# Patient Record
Sex: Male | Born: 1950
Health system: Southern US, Community
[De-identification: ages and names within clinical notes are randomized; demographics above are authoritative.]

## PROBLEM LIST (undated history)

## (undated) DIAGNOSIS — K4020 Bilateral inguinal hernia, without obstruction or gangrene, not specified as recurrent: Secondary | ICD-10-CM

## (undated) DIAGNOSIS — R0600 Dyspnea, unspecified: Secondary | ICD-10-CM

## (undated) DIAGNOSIS — L719 Rosacea, unspecified: Secondary | ICD-10-CM

## (undated) DIAGNOSIS — K760 Fatty (change of) liver, not elsewhere classified: Secondary | ICD-10-CM

## (undated) DIAGNOSIS — K8020 Calculus of gallbladder without cholecystitis without obstruction: Secondary | ICD-10-CM

## (undated) DIAGNOSIS — N433 Hydrocele, unspecified: Secondary | ICD-10-CM

## (undated) DIAGNOSIS — N200 Calculus of kidney: Secondary | ICD-10-CM

## (undated) DIAGNOSIS — E785 Hyperlipidemia, unspecified: Secondary | ICD-10-CM

## (undated) DIAGNOSIS — H409 Unspecified glaucoma: Secondary | ICD-10-CM

## (undated) DIAGNOSIS — N529 Male erectile dysfunction, unspecified: Secondary | ICD-10-CM

## (undated) DIAGNOSIS — J189 Pneumonia, unspecified organism: Secondary | ICD-10-CM

## (undated) DIAGNOSIS — I4891 Unspecified atrial fibrillation: Secondary | ICD-10-CM

## (undated) DIAGNOSIS — C44311 Basal cell carcinoma of skin of nose: Secondary | ICD-10-CM

## (undated) DIAGNOSIS — J449 Chronic obstructive pulmonary disease, unspecified: Secondary | ICD-10-CM

## (undated) DIAGNOSIS — Z8701 Personal history of pneumonia (recurrent): Principal | ICD-10-CM

## (undated) DIAGNOSIS — H269 Unspecified cataract: Secondary | ICD-10-CM

## (undated) DIAGNOSIS — N35919 Unspecified urethral stricture, male, unspecified site: Secondary | ICD-10-CM

## (undated) DIAGNOSIS — I5189 Other ill-defined heart diseases: Secondary | ICD-10-CM

## (undated) DIAGNOSIS — K219 Gastro-esophageal reflux disease without esophagitis: Secondary | ICD-10-CM

## (undated) DIAGNOSIS — I2510 Atherosclerotic heart disease of native coronary artery without angina pectoris: Secondary | ICD-10-CM

## (undated) DIAGNOSIS — Z8619 Personal history of other infectious and parasitic diseases: Secondary | ICD-10-CM

## (undated) DIAGNOSIS — Z87442 Personal history of urinary calculi: Secondary | ICD-10-CM

## (undated) DIAGNOSIS — Z972 Presence of dental prosthetic device (complete) (partial): Secondary | ICD-10-CM

## (undated) DIAGNOSIS — Z87891 Personal history of nicotine dependence: Secondary | ICD-10-CM

## (undated) DIAGNOSIS — I700 Atherosclerosis of aorta: Secondary | ICD-10-CM

## (undated) DIAGNOSIS — I10 Essential (primary) hypertension: Secondary | ICD-10-CM

## (undated) DIAGNOSIS — K429 Umbilical hernia without obstruction or gangrene: Secondary | ICD-10-CM

## (undated) DIAGNOSIS — M1990 Unspecified osteoarthritis, unspecified site: Secondary | ICD-10-CM

## (undated) DIAGNOSIS — L219 Seborrheic dermatitis, unspecified: Secondary | ICD-10-CM

## (undated) DIAGNOSIS — I499 Cardiac arrhythmia, unspecified: Secondary | ICD-10-CM

## (undated) HISTORY — DX: Gastro-esophageal reflux disease without esophagitis: K21.9

## (undated) HISTORY — DX: Atherosclerotic heart disease of native coronary artery without angina pectoris: I25.10

## (undated) HISTORY — DX: Rosacea, unspecified: L71.9

## (undated) HISTORY — DX: Unspecified atrial fibrillation: I48.91

## (undated) HISTORY — DX: Calculus of gallbladder without cholecystitis without obstruction: K80.20

## (undated) HISTORY — DX: Unspecified glaucoma: H40.9

## (undated) HISTORY — DX: Calculus of kidney: N20.0

## (undated) HISTORY — DX: Personal history of pneumonia (recurrent): Z87.01

## (undated) HISTORY — DX: Personal history of other infectious and parasitic diseases: Z86.19

## (undated) HISTORY — DX: Basal cell carcinoma of skin of nose: C44.311

## (undated) HISTORY — DX: Unspecified osteoarthritis, unspecified site: M19.90

## (undated) HISTORY — DX: Male erectile dysfunction, unspecified: N52.9

## (undated) HISTORY — DX: Chronic obstructive pulmonary disease, unspecified: J44.9

## (undated) HISTORY — DX: Fatty (change of) liver, not elsewhere classified: K76.0

## (undated) HISTORY — DX: Atherosclerosis of aorta: I70.0

## (undated) HISTORY — PX: COLONOSCOPY: SHX174

## (undated) HISTORY — DX: Seborrheic dermatitis, unspecified: L21.9

## (undated) HISTORY — DX: Hyperlipidemia, unspecified: E78.5

## (undated) HISTORY — DX: Personal history of nicotine dependence: Z87.891

---

## 1996-02-04 DIAGNOSIS — C44311 Basal cell carcinoma of skin of nose: Secondary | ICD-10-CM

## 1996-02-04 HISTORY — DX: Basal cell carcinoma of skin of nose: C44.311

## 2001-12-24 ENCOUNTER — Encounter: Admission: RE | Admit: 2001-12-24 | Discharge: 2001-12-24 | Payer: Self-pay | Admitting: Family Medicine

## 2001-12-24 ENCOUNTER — Encounter: Payer: Self-pay | Admitting: Family Medicine

## 2002-01-04 ENCOUNTER — Encounter: Payer: Self-pay | Admitting: Family Medicine

## 2002-01-04 ENCOUNTER — Encounter: Admission: RE | Admit: 2002-01-04 | Discharge: 2002-01-04 | Payer: Self-pay | Admitting: Family Medicine

## 2012-05-04 ENCOUNTER — Emergency Department: Payer: Self-pay | Admitting: Emergency Medicine

## 2013-04-28 LAB — COMPREHENSIVE METABOLIC PANEL
ALK PHOS: 55 U/L
ALT: 32
AST: 31 U/L
Creat: 1.22
Glucose: 86
LDH: 192 U/L
Potassium: 4.7 mmol/L
Sodium: 145 mmol/L (ref 137–147)
Total Bilirubin: 0.5 mg/dL
Uric Acid: 7.8

## 2013-04-28 LAB — LIPID PANEL
CHOLESTEROL: 213 mg/dL — AB (ref 0–200)
HDL: 48 mg/dL (ref 35–70)
Triglycerides: 75

## 2013-06-03 DIAGNOSIS — Z8701 Personal history of pneumonia (recurrent): Secondary | ICD-10-CM

## 2013-06-03 DIAGNOSIS — I4891 Unspecified atrial fibrillation: Secondary | ICD-10-CM | POA: Insufficient documentation

## 2013-06-03 HISTORY — DX: Unspecified atrial fibrillation: I48.91

## 2013-06-03 HISTORY — DX: Personal history of pneumonia (recurrent): Z87.01

## 2013-06-04 ENCOUNTER — Inpatient Hospital Stay: Payer: Self-pay | Admitting: Internal Medicine

## 2013-06-04 DIAGNOSIS — R946 Abnormal results of thyroid function studies: Secondary | ICD-10-CM | POA: Diagnosis not present

## 2013-06-04 DIAGNOSIS — L408 Other psoriasis: Secondary | ICD-10-CM | POA: Diagnosis present

## 2013-06-04 DIAGNOSIS — E0781 Sick-euthyroid syndrome: Secondary | ICD-10-CM | POA: Diagnosis present

## 2013-06-04 DIAGNOSIS — R05 Cough: Secondary | ICD-10-CM | POA: Diagnosis not present

## 2013-06-04 DIAGNOSIS — R071 Chest pain on breathing: Secondary | ICD-10-CM | POA: Diagnosis not present

## 2013-06-04 DIAGNOSIS — A403 Sepsis due to Streptococcus pneumoniae: Secondary | ICD-10-CM | POA: Diagnosis present

## 2013-06-04 DIAGNOSIS — J13 Pneumonia due to Streptococcus pneumoniae: Secondary | ICD-10-CM | POA: Diagnosis present

## 2013-06-04 DIAGNOSIS — E119 Type 2 diabetes mellitus without complications: Secondary | ICD-10-CM | POA: Diagnosis present

## 2013-06-04 DIAGNOSIS — J441 Chronic obstructive pulmonary disease with (acute) exacerbation: Secondary | ICD-10-CM | POA: Diagnosis present

## 2013-06-04 DIAGNOSIS — A419 Sepsis, unspecified organism: Secondary | ICD-10-CM | POA: Diagnosis present

## 2013-06-04 DIAGNOSIS — N179 Acute kidney failure, unspecified: Secondary | ICD-10-CM | POA: Diagnosis not present

## 2013-06-04 DIAGNOSIS — I959 Hypotension, unspecified: Secondary | ICD-10-CM | POA: Diagnosis not present

## 2013-06-04 DIAGNOSIS — H409 Unspecified glaucoma: Secondary | ICD-10-CM | POA: Diagnosis present

## 2013-06-04 DIAGNOSIS — R0602 Shortness of breath: Secondary | ICD-10-CM | POA: Diagnosis not present

## 2013-06-04 DIAGNOSIS — R059 Cough, unspecified: Secondary | ICD-10-CM | POA: Diagnosis not present

## 2013-06-04 DIAGNOSIS — R079 Chest pain, unspecified: Secondary | ICD-10-CM | POA: Diagnosis not present

## 2013-06-04 DIAGNOSIS — E860 Dehydration: Secondary | ICD-10-CM | POA: Diagnosis present

## 2013-06-04 DIAGNOSIS — Z794 Long term (current) use of insulin: Secondary | ICD-10-CM | POA: Diagnosis not present

## 2013-06-04 DIAGNOSIS — J189 Pneumonia, unspecified organism: Secondary | ICD-10-CM | POA: Diagnosis not present

## 2013-06-04 DIAGNOSIS — Z7982 Long term (current) use of aspirin: Secondary | ICD-10-CM | POA: Diagnosis not present

## 2013-06-04 DIAGNOSIS — I509 Heart failure, unspecified: Secondary | ICD-10-CM | POA: Diagnosis not present

## 2013-06-04 DIAGNOSIS — F17200 Nicotine dependence, unspecified, uncomplicated: Secondary | ICD-10-CM | POA: Diagnosis not present

## 2013-06-04 DIAGNOSIS — I4891 Unspecified atrial fibrillation: Secondary | ICD-10-CM | POA: Diagnosis not present

## 2013-06-04 LAB — CBC
HCT: 39.4 % — ABNORMAL LOW (ref 40.0–52.0)
HGB: 13.1 g/dL (ref 13.0–18.0)
MCH: 28.6 pg (ref 26.0–34.0)
MCHC: 33.2 g/dL (ref 32.0–36.0)
MCV: 86 fL (ref 80–100)
PLATELETS: 253 10*3/uL (ref 150–440)
RBC: 4.57 10*6/uL (ref 4.40–5.90)
RDW: 14.4 % (ref 11.5–14.5)
WBC: 39 10*3/uL — ABNORMAL HIGH (ref 3.8–10.6)

## 2013-06-04 LAB — COMPREHENSIVE METABOLIC PANEL
ALK PHOS: 53 U/L
AST: 15 U/L (ref 15–37)
Albumin: 3.2 g/dL — ABNORMAL LOW (ref 3.4–5.0)
Anion Gap: 11 (ref 7–16)
BILIRUBIN TOTAL: 0.8 mg/dL (ref 0.2–1.0)
BUN: 21 mg/dL — AB (ref 7–18)
CO2: 25 mmol/L (ref 21–32)
Calcium, Total: 8.9 mg/dL (ref 8.5–10.1)
Chloride: 100 mmol/L (ref 98–107)
Creatinine: 1.72 mg/dL — ABNORMAL HIGH (ref 0.60–1.30)
EGFR (African American): 48 — ABNORMAL LOW
GFR CALC NON AF AMER: 41 — AB
Glucose: 135 mg/dL — ABNORMAL HIGH (ref 65–99)
Osmolality: 277 (ref 275–301)
POTASSIUM: 3.7 mmol/L (ref 3.5–5.1)
SGPT (ALT): 26 U/L (ref 12–78)
Sodium: 136 mmol/L (ref 136–145)
TOTAL PROTEIN: 6.9 g/dL (ref 6.4–8.2)

## 2013-06-04 LAB — DIFFERENTIAL
BASOS ABS: 1 %
Bands: 14 %
COMMENT - H1-COM2: NORMAL
LYMPHS PCT: 7 %
Monocytes: 5 %
SEGMENTED NEUTROPHILS: 73 %

## 2013-06-04 LAB — TROPONIN I
Troponin-I: <0.02 ng/mL
Troponin-I: <0.02 ng/mL

## 2013-06-04 LAB — D-DIMER(ARMC): D-Dimer: 579 ng/ml

## 2013-06-05 DIAGNOSIS — F17200 Nicotine dependence, unspecified, uncomplicated: Secondary | ICD-10-CM | POA: Diagnosis not present

## 2013-06-05 DIAGNOSIS — N179 Acute kidney failure, unspecified: Secondary | ICD-10-CM | POA: Diagnosis not present

## 2013-06-05 DIAGNOSIS — I509 Heart failure, unspecified: Secondary | ICD-10-CM | POA: Diagnosis not present

## 2013-06-05 DIAGNOSIS — A419 Sepsis, unspecified organism: Secondary | ICD-10-CM | POA: Diagnosis not present

## 2013-06-05 DIAGNOSIS — I4891 Unspecified atrial fibrillation: Secondary | ICD-10-CM | POA: Diagnosis not present

## 2013-06-05 DIAGNOSIS — J189 Pneumonia, unspecified organism: Secondary | ICD-10-CM | POA: Diagnosis not present

## 2013-06-05 LAB — TSH: THYROID STIMULATING HORM: 0.231 u[IU]/mL — AB

## 2013-06-05 LAB — HEMOGLOBIN A1C: Hemoglobin A1C: 6.4 % — ABNORMAL HIGH (ref 4.2–6.3)

## 2013-06-06 DIAGNOSIS — N179 Acute kidney failure, unspecified: Secondary | ICD-10-CM | POA: Diagnosis not present

## 2013-06-06 DIAGNOSIS — A419 Sepsis, unspecified organism: Secondary | ICD-10-CM | POA: Diagnosis not present

## 2013-06-06 DIAGNOSIS — I509 Heart failure, unspecified: Secondary | ICD-10-CM | POA: Diagnosis not present

## 2013-06-06 DIAGNOSIS — F17200 Nicotine dependence, unspecified, uncomplicated: Secondary | ICD-10-CM | POA: Diagnosis not present

## 2013-06-06 DIAGNOSIS — J189 Pneumonia, unspecified organism: Secondary | ICD-10-CM | POA: Diagnosis not present

## 2013-06-06 DIAGNOSIS — I4891 Unspecified atrial fibrillation: Secondary | ICD-10-CM | POA: Diagnosis not present

## 2013-06-06 LAB — CREATININE, SERUM
Creatinine: 1.19 mg/dL (ref 0.60–1.30)
EGFR (African American): >60
EGFR (Non-African Amer.): >60

## 2013-06-06 LAB — T4, FREE: Free Thyroxine: 0.9 ng/dL (ref 0.76–1.46)

## 2013-06-06 LAB — WBC: WBC: 27.6 10*3/uL — ABNORMAL HIGH (ref 3.8–10.6)

## 2013-06-07 DIAGNOSIS — F17200 Nicotine dependence, unspecified, uncomplicated: Secondary | ICD-10-CM | POA: Diagnosis not present

## 2013-06-07 DIAGNOSIS — J189 Pneumonia, unspecified organism: Secondary | ICD-10-CM | POA: Diagnosis not present

## 2013-06-07 DIAGNOSIS — I4891 Unspecified atrial fibrillation: Secondary | ICD-10-CM | POA: Diagnosis not present

## 2013-06-07 DIAGNOSIS — I509 Heart failure, unspecified: Secondary | ICD-10-CM | POA: Diagnosis not present

## 2013-06-07 DIAGNOSIS — A419 Sepsis, unspecified organism: Secondary | ICD-10-CM | POA: Diagnosis not present

## 2013-06-07 DIAGNOSIS — N179 Acute kidney failure, unspecified: Secondary | ICD-10-CM | POA: Diagnosis not present

## 2013-06-07 LAB — CBC WITH DIFFERENTIAL/PLATELET
BASOS ABS: 0.1 10*3/uL (ref 0.0–0.1)
Basophil %: 0.4 %
EOS ABS: 0 10*3/uL (ref 0.0–0.7)
EOS PCT: 0.1 %
HCT: 38.8 % — ABNORMAL LOW (ref 40.0–52.0)
HGB: 12.6 g/dL — AB (ref 13.0–18.0)
LYMPHS ABS: 3.8 10*3/uL — AB (ref 1.0–3.6)
LYMPHS PCT: 28.1 %
MCH: 28.3 pg (ref 26.0–34.0)
MCHC: 32.4 g/dL (ref 32.0–36.0)
MCV: 87 fL (ref 80–100)
Monocyte #: 0.7 x10 3/mm (ref 0.2–1.0)
Monocyte %: 5.5 %
Neutrophil #: 9 10*3/uL — ABNORMAL HIGH (ref 1.4–6.5)
Neutrophil %: 65.9 %
Platelet: 249 10*3/uL (ref 150–440)
RBC: 4.45 10*6/uL (ref 4.40–5.90)
RDW: 14.5 % (ref 11.5–14.5)
WBC: 13.7 10*3/uL — ABNORMAL HIGH (ref 3.8–10.6)

## 2013-06-07 LAB — COMPREHENSIVE METABOLIC PANEL
ALK PHOS: 53 U/L
ALT: 26 U/L (ref 10–40)
AST: 15 U/L
CREATININE: 1.72
GLUCOSE: 135
Potassium: 3.7 mmol/L
Sodium: 136 mmol/L — AB (ref 137–147)
Total Bilirubin: 0.8 mg/dL

## 2013-06-07 LAB — CBC
HEMOGLOBIN: 13.1 g/dL
PLATELET COUNT: 253
WBC: 39

## 2013-06-07 LAB — T4, FREE: Free T4: 0.9

## 2013-06-07 LAB — EXPECTORATED SPUTUM ASSESSMENT W GRAM STAIN, RFLX TO RESP C

## 2013-06-07 LAB — TSH: TSH: 0.231

## 2013-06-07 LAB — HEMOGLOBIN A1C: A1C: 6.4

## 2013-06-09 DIAGNOSIS — I4891 Unspecified atrial fibrillation: Secondary | ICD-10-CM | POA: Diagnosis not present

## 2013-06-09 DIAGNOSIS — I10 Essential (primary) hypertension: Secondary | ICD-10-CM | POA: Diagnosis not present

## 2013-06-09 DIAGNOSIS — R079 Chest pain, unspecified: Secondary | ICD-10-CM | POA: Diagnosis not present

## 2013-06-09 LAB — CULTURE, BLOOD (SINGLE)

## 2013-06-14 DIAGNOSIS — R072 Precordial pain: Secondary | ICD-10-CM | POA: Diagnosis not present

## 2013-06-16 DIAGNOSIS — I10 Essential (primary) hypertension: Secondary | ICD-10-CM | POA: Diagnosis not present

## 2013-06-16 DIAGNOSIS — I4891 Unspecified atrial fibrillation: Secondary | ICD-10-CM | POA: Diagnosis not present

## 2013-06-16 DIAGNOSIS — F17200 Nicotine dependence, unspecified, uncomplicated: Secondary | ICD-10-CM | POA: Diagnosis not present

## 2013-07-04 DIAGNOSIS — R946 Abnormal results of thyroid function studies: Secondary | ICD-10-CM | POA: Diagnosis not present

## 2013-07-11 ENCOUNTER — Encounter: Payer: Self-pay | Admitting: Family Medicine

## 2013-07-11 ENCOUNTER — Ambulatory Visit (INDEPENDENT_AMBULATORY_CARE_PROVIDER_SITE_OTHER): Payer: Medicare Other | Admitting: Family Medicine

## 2013-07-11 ENCOUNTER — Encounter (INDEPENDENT_AMBULATORY_CARE_PROVIDER_SITE_OTHER): Payer: Self-pay

## 2013-07-11 VITALS — BP 122/68 | HR 62 | Temp 98.1°F | Ht 65.5 in | Wt 156.0 lb

## 2013-07-11 DIAGNOSIS — E785 Hyperlipidemia, unspecified: Secondary | ICD-10-CM

## 2013-07-11 DIAGNOSIS — K219 Gastro-esophageal reflux disease without esophagitis: Secondary | ICD-10-CM

## 2013-07-11 DIAGNOSIS — Z8701 Personal history of pneumonia (recurrent): Secondary | ICD-10-CM

## 2013-07-11 DIAGNOSIS — I4891 Unspecified atrial fibrillation: Secondary | ICD-10-CM | POA: Diagnosis not present

## 2013-07-11 DIAGNOSIS — Z23 Encounter for immunization: Secondary | ICD-10-CM | POA: Diagnosis not present

## 2013-07-11 DIAGNOSIS — J449 Chronic obstructive pulmonary disease, unspecified: Secondary | ICD-10-CM | POA: Insufficient documentation

## 2013-07-11 DIAGNOSIS — Z87891 Personal history of nicotine dependence: Secondary | ICD-10-CM | POA: Insufficient documentation

## 2013-07-11 DIAGNOSIS — H409 Unspecified glaucoma: Secondary | ICD-10-CM | POA: Diagnosis not present

## 2013-07-11 NOTE — Progress Notes (Signed)
Pre visit review using our clinic review tool, if applicable. No additional management support is needed unless otherwise documented below in the visit note. 

## 2013-07-11 NOTE — Assessment & Plan Note (Signed)
Controlled on daily PPI

## 2013-07-11 NOTE — Assessment & Plan Note (Signed)
Followed regularly by eye doctor 

## 2013-07-11 NOTE — Assessment & Plan Note (Signed)
Broached discussion on lung cancer screening with LDCT. Pt defers discussion for now.

## 2013-07-11 NOTE — Assessment & Plan Note (Signed)
Compliant with flonase 9mcg 2 pufs bid.  Also using combivent inhaler scheduled QID. Advised to decrease this to PRN dosing as tolerated. Pt agrees.

## 2013-07-11 NOTE — Assessment & Plan Note (Signed)
Anticipate sole episode of afib brought on during stress of recent hospitalization with PNA. Currently sounds regular. Recommended continue aspirin daily and sotalol bid. Did not start further anticoagulation today, but reviewed with patient reasons to return for further eval ie recurrent irregular heart rhythm or other concerns. Recheck next visit.

## 2013-07-11 NOTE — Assessment & Plan Note (Signed)
Has resolved well. Provided with prevnar today.

## 2013-07-11 NOTE — Assessment & Plan Note (Signed)
Off meds. Check FLP next fasting blood work.

## 2013-07-11 NOTE — Patient Instructions (Addendum)
prevnar today. Try to decrease combivent - try to use only as needed. Let me know when you're running low on sotalol. Continue other medicines as up to now. Nice to meet you today, call us with questions. Return at your convenience fasting and afterwards for a Welcome to Medicare visit.

## 2013-07-11 NOTE — Progress Notes (Signed)
BP 122/68  Pulse 62  Temp(Src) 98.1 F (36.7 C) (Oral)  Ht 5' 5.5" (1.664 m)  Wt 156 lb (70.761 kg)  BMI 25.56 kg/m2  SpO2 95%   CC: new pt to establish care  Subjective:    Patient ID: Victor Cortez, male    DOB: 1950/12/10, 63 y.o.   MRN: [id number]  HPI: Victor Cortez is a 63 y.o. male presenting on 07/11/2013 for Establish Care   Victor Cortez is an acquaintance of Dr. Honor Junes.  Prior saw Dr. Burt Ek. Brings packet of records from city of [place] - I will review and return to patient.  Longtime smoker - 100+ PY hx. Declines discussion for lung cancer screening at this time.  Recent PNA with sepsis s/p hospitalization at [facility] for 3.5 days. Treated with keflex and zpack and has finished this course. Denies persistent productive cough. Stays dyspneic chronically.  White count was markedly elevated at 39, Cr was elevated at 1.7 prior to discharge.  GERD - controlled on protonix. Atrial fibrillation - rate controlled on sotalol.  This was started after recent hospitalization  Preventative: 05/2013 with Dr. Burt Ek Colonoscopy - >10 yrs ago with possible polyp (Dr. Delfin Edis) Flu shot - 11/2012 Prevnar - today. 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  Allergies and medications reviewed and updated. No current outpatient prescriptions on file prior to visit.   No current facility-administered medications on file prior to visit.    Review of Systems Per HPI unless specifically indicated above    Objective:    BP 122/68  Pulse 62  Temp(Src) 98.1 F (36.7 C) (Oral)  Ht 5' 5.5" (1.664 m)  Wt 156 lb (70.761 kg)  BMI 25.56 kg/m2  SpO2 95%  Physical Exam  Nursing  note and vitals reviewed. Constitutional: He is oriented to person, place, and time. He appears well-developed and well-nourished. No distress.  HENT:  Head: Normocephalic and atraumatic.  Mouth/Throat: Uvula is midline, oropharynx is clear and moist and mucous membranes are normal. No oropharyngeal exudate, posterior oropharyngeal edema or posterior oropharyngeal erythema.  Eyes: Conjunctivae and EOM are normal. Pupils are equal, round, and reactive to light. No scleral icterus.  Neck: Normal range of motion. Neck supple. Carotid bruit is not present. No thyromegaly present.  Cardiovascular: Normal rate, regular rhythm, normal heart sounds and intact distal pulses.   No murmur heard. Pulses:      Radial pulses are 2+ on the right side, and 2+ on the left side.  Sounds regular today  Pulmonary/Chest: Effort normal and breath sounds normal. No respiratory distress. He has no wheezes. He has no rales.  coarse L basilarly  Musculoskeletal: Normal range of motion. He exhibits no edema.  Lymphadenopathy:    He has no cervical adenopathy.  Neurological: He is alert and oriented to person, place, and time.  CN grossly intact, station and gait intact  Skin: Skin is warm and dry. No rash noted.  Psychiatric: He has a normal mood and affect. His behavior is normal. Judgment and thought content normal.   No results found for this or any previous visit.    Assessment & Plan:   Problem List Items Addressed This Visit   Lone atrial fibrillation with RVR  Anticipate sole episode of afib brought on during stress of recent hospitalization with PNA. Currently sounds regular. Recommended continue aspirin daily and sotalol bid. Did not start further anticoagulation today, but reviewed with patient reasons to return for further eval ie recurrent irregular heart rhythm or other concerns. Recheck next visit.    Relevant Medications      aspirin EC 81 MG tablet      sotalol (BETAPACE) 80 MG tablet    Hyperlipidemia     Off meds. Check FLP next fasting blood work.    Relevant Medications      aspirin EC 81 MG tablet      sotalol (BETAPACE) 80 MG tablet   History of pneumonia - Primary     Has resolved well. Provided with prevnar today.    Glaucoma     Followed regularly by eye doctor    Relevant Medications      latanoprost (XALATAN) 0.005 % ophthalmic solution   GERD (gastroesophageal reflux disease)     Controlled on daily PPI    Relevant Medications      pantoprazole (PROTONIX) 40 MG tablet   Ex-smoker     Broached discussion on lung cancer screening with LDCT. Pt defers discussion for now.    COPD (chronic obstructive pulmonary disease)     Compliant with flonase 88mcg 2 pufs bid.  Also using combivent inhaler scheduled QID. Advised to decrease this to PRN dosing as tolerated. Pt agrees.    Relevant Medications      Ipratropium-Albuterol (COMBIVENT RESPIMAT) 20-100 MCG/ACT AERS respimat      fluticasone (FLOVENT HFA) 44 MCG/ACT inhaler       Follow up plan: Return in about 4 months (around 11/10/2013), or as needed, for welcome to medicare visit, prior fasting for blood work.Marland Kitchen

## 2013-07-13 ENCOUNTER — Encounter: Payer: Self-pay | Admitting: *Deleted

## 2013-08-21 ENCOUNTER — Encounter: Payer: Self-pay | Admitting: Family Medicine

## 2013-08-23 ENCOUNTER — Encounter: Payer: Self-pay | Admitting: *Deleted

## 2013-09-06 DIAGNOSIS — H269 Unspecified cataract: Secondary | ICD-10-CM | POA: Diagnosis not present

## 2013-09-06 DIAGNOSIS — H409 Unspecified glaucoma: Secondary | ICD-10-CM | POA: Diagnosis not present

## 2013-09-06 DIAGNOSIS — H4010X0 Unspecified open-angle glaucoma, stage unspecified: Secondary | ICD-10-CM | POA: Diagnosis not present

## 2013-09-06 DIAGNOSIS — H524 Presbyopia: Secondary | ICD-10-CM | POA: Diagnosis not present

## 2013-09-06 DIAGNOSIS — H5200 Hypermetropia, unspecified eye: Secondary | ICD-10-CM | POA: Diagnosis not present

## 2013-09-06 DIAGNOSIS — H52229 Regular astigmatism, unspecified eye: Secondary | ICD-10-CM | POA: Diagnosis not present

## 2013-09-29 DIAGNOSIS — I4891 Unspecified atrial fibrillation: Secondary | ICD-10-CM | POA: Diagnosis not present

## 2013-09-29 DIAGNOSIS — I10 Essential (primary) hypertension: Secondary | ICD-10-CM | POA: Diagnosis not present

## 2013-10-27 DIAGNOSIS — Z23 Encounter for immunization: Secondary | ICD-10-CM | POA: Diagnosis not present

## 2013-12-08 DIAGNOSIS — H4011X1 Primary open-angle glaucoma, mild stage: Secondary | ICD-10-CM | POA: Diagnosis not present

## 2014-02-03 HISTORY — PX: ROTATOR CUFF REPAIR: SHX139

## 2014-02-23 ENCOUNTER — Ambulatory Visit (INDEPENDENT_AMBULATORY_CARE_PROVIDER_SITE_OTHER)
Admission: RE | Admit: 2014-02-23 | Discharge: 2014-02-23 | Disposition: A | Discharge status: Home / Self Care | Payer: Medicare Other | Source: Ambulatory Visit | Attending: Family Medicine | Admitting: Family Medicine

## 2014-02-23 ENCOUNTER — Ambulatory Visit (INDEPENDENT_AMBULATORY_CARE_PROVIDER_SITE_OTHER): Payer: Medicare Other | Admitting: Family Medicine

## 2014-02-23 ENCOUNTER — Encounter: Payer: Self-pay | Admitting: Family Medicine

## 2014-02-23 VITALS — BP 136/78 | HR 72 | Temp 97.7°F | Wt 165.8 lb

## 2014-02-23 DIAGNOSIS — M19011 Primary osteoarthritis, right shoulder: Secondary | ICD-10-CM | POA: Diagnosis not present

## 2014-02-23 DIAGNOSIS — N289 Disorder of kidney and ureter, unspecified: Secondary | ICD-10-CM | POA: Diagnosis not present

## 2014-02-23 DIAGNOSIS — M25511 Pain in right shoulder: Secondary | ICD-10-CM

## 2014-02-23 LAB — RENAL FUNCTION PANEL
ALBUMIN: 4.4 g/dL (ref 3.5–5.2)
BUN: 12 mg/dL (ref 6–23)
CO2: 29 meq/L (ref 19–32)
Calcium: 10.2 mg/dL (ref 8.4–10.5)
Chloride: 102 mEq/L (ref 96–112)
Creatinine, Ser: 1.24 mg/dL (ref 0.40–1.50)
GFR: 62.05 mL/min (ref >60.00)
Glucose, Bld: 106 mg/dL — ABNORMAL HIGH (ref 70–99)
Phosphorus: 3.8 mg/dL (ref 2.3–4.6)
Potassium: 4.4 mEq/L (ref 3.5–5.1)
Sodium: 140 mEq/L (ref 135–145)

## 2014-02-23 MED ORDER — PANTOPRAZOLE SODIUM 40 MG PO TBEC: 40.0000 mg | DELAYED_RELEASE_TABLET | Freq: Every day | ORAL | Status: DC

## 2014-02-23 MED ORDER — TRAMADOL-ACETAMINOPHEN 37.5-325 MG PO TABS: 1.0000 | ORAL_TABLET | Freq: Two times a day (BID) | ORAL | Status: DC | PRN

## 2014-02-23 NOTE — Assessment & Plan Note (Signed)
Anticipate RTC syndrome (irritation, possible partial tear) + biceps tendonitis given tender to palpation at biceps tendon + shoulder arthritis. xrays today for baseline. Provided with stretching exercises from [facility] pt advisor. Continue advil, add on ultracet for pain, heating pad/ice for symptomatic relief If not improved with above treatment, suggested return for shoulder steroid injection and PT referral.

## 2014-02-23 NOTE — Assessment & Plan Note (Signed)
Renal insuff noted during hospitalization last year with PNA - recheck today.

## 2014-02-23 NOTE — Progress Notes (Signed)
BP 136/78 mmHg  Pulse 72  Temp(Src) 97.7 F (36.5 C) (Oral)  Wt 165 lb 12 oz (75.184 kg)   CC: R arm pain  Subjective:    Patient ID: Victor Cortez, male    DOB: 09/15/1950, 64 y.o.   MRN: [id number]  HPI: Victor Cortez is a 64 y.o. male presenting on 02/23/2014 for Arm Pain   2 mo h/o R arm pain - throbbing pain from shoulder down to fingertips, mild neck pain.  Denies inciting trauma/injury. Denies numbness of arm. Feels weak - limited ROM when raises shoulder past 90 degrees.  So far has tried ibuprofen 2 pills nightly which helps. 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 Worse pain at night time if doesn't take advil PM.  A few years ago this happened, dx with tennis elbow and resolved with cortisone shot. Current sxs are worse.  Lab Results  Component Value Date   CREATININE 1.72 06/07/2013    ?Hyperthyroidism - s/p eval by endo and normal on rpt.  Relevant past medical, surgical, family and social history reviewed and updated as indicated. Interim medical history since our last visit reviewed. Allergies and medications reviewed and updated. Current Outpatient Prescriptions on File Prior to Visit  Medication Sig  . aspirin EC 81 MG tablet Take by mouth. 1 tab by mouth in AM  . Cyanocobalamin (RA VITAMIN B-12 TR) 1000 MCG TBCR Take by mouth. One tab by mouth daily  . fluticasone (FLOVENT HFA) 110 MCG/ACT inhaler Inhale 2 puffs into the lungs 2 (two) times daily.  . Glucosamine-Chondroit-Vit C-Mn (GLUCOSAMINE 1500 COMPLEX) CAPS Take 1 capsule by mouth daily.  . Ipratropium-Albuterol (COMBIVENT RESPIMAT) 20-100 MCG/ACT AERS respimat Inhale 1 puff into the lungs every 6 (six) hours as needed. One puff four times a day  . latanoprost (XALATAN) 0.005 % ophthalmic solution One drop in both eyes at bedtime  . Multiple Vitamin (MULTI-VITAMINS) TABS Take by mouth. 1 tab by mouth daily  . Omega-3 Fatty Acids (FISH OIL) 1000 MG CAPS Take by mouth. 1 cap by mouth  daily  . sotalol (BETAPACE) 80 MG tablet Take by mouth. One tab by mouth twice a day  . metroNIDAZOLE (METROCREAM) 0.75 % cream Apply 1 application topically 2 (two) times daily as needed.   No current facility-administered medications on file prior to visit.    Review of Systems Per HPI unless specifically indicated above     Objective:    BP 136/78 mmHg  Pulse 72  Temp(Src) 97.7 F (36.5 C) (Oral)  Wt 165 lb 12 oz (75.184 kg)  Wt Readings from Last 3 Encounters:  02/23/14 165 lb 12 oz (75.184 kg)  07/11/13 156 lb (70.761 kg)    Physical Exam  Constitutional: He appears well-developed and well-nourished. No distress.  Musculoskeletal: He exhibits no edema.  L shoulder WNL R Shoulder exam: No deformity of shoulders on inspection. Tender palpation of anterior R shoulder near [facility] joint but not at subacromial bursa FROM in abduction and forward flexion but pain noted above 90degrees. + pain /weakness with testing SITS in ext rotation. + pain with empty can sign. + Yerguson, Speed test. No significant impingement. No pain with rotation of humeral head in [facility] joint.   Nursing note and vitals reviewed.  Results for orders placed or performed in visit on 08/23/13  Comprehensive metabolic panel  Result Value Ref Range   Glucose 86    Uric Acid 7.8    Creat 1.22  Sodium 145 137 - 147 mmol/L   Potassium 4.7 mmol/L   Total Bilirubin 0.5 mg/dL   Alkaline Phosphatase 55 U/L   LDH 192 u/L   AST 31 U/L   ALT 32   Lipid panel  Result Value Ref Range   Cholesterol 213 (A) 0 - 200 mg/dL   Triglycerides 75    HDL 48 35 - 70 mg/dL      Assessment & Plan:   Problem List Items Addressed This Visit    Right anterior shoulder pain - Primary    Anticipate RTC syndrome (irritation, possible partial tear) + biceps tendonitis given tender to palpation at biceps tendon + shoulder arthritis. xrays today for baseline. Provided with stretching exercises from [facility] pt advisor. Continue  advil, add on ultracet for pain, heating pad/ice for symptomatic relief If not improved with above treatment, suggested return for shoulder steroid injection and PT referral.      Relevant Orders   DG Shoulder Right   Renal insufficiency    Renal insuff noted during hospitalization last year with PNA - recheck today.      Relevant Orders   Renal function panel       Follow up plan: Return if symptoms worsen or fail to improve.

## 2014-02-23 NOTE — Progress Notes (Signed)
Pre visit review using our clinic review tool, if applicable. No additional management support is needed unless otherwise documented below in the visit note. 

## 2014-02-23 NOTE — Patient Instructions (Signed)
I think you have rotator cuff injury with inflammation. Continue advil anti inflammatory, ice or heat to shoulder (whichever soothes better), and start ultracet pain med for night time pain. Check kidneys today (lab) Xray of R shoulder today. Start exercises provided today with resistance band. If not improving, return for steroid shot and referral to PT. If not better with that, we will refer you to orthopedist. Let us know if any worsening pain sooner.

## 2014-02-28 ENCOUNTER — Ambulatory Visit (INDEPENDENT_AMBULATORY_CARE_PROVIDER_SITE_OTHER): Payer: Medicare Other | Admitting: Family Medicine

## 2014-02-28 ENCOUNTER — Encounter: Payer: Self-pay | Admitting: Family Medicine

## 2014-02-28 VITALS — BP 136/84 | HR 68 | Temp 97.8°F | Wt 166.0 lb

## 2014-02-28 DIAGNOSIS — M25511 Pain in right shoulder: Secondary | ICD-10-CM

## 2014-02-28 NOTE — Assessment & Plan Note (Signed)
Steroid injection performed today for RTC tendonitis/inflammation.  Discussed monitoring for signs of developing joint infection after steroid injection. Discussed possible steroid flare after shot. If not improving with injection, pt will notify me for PT referral. If worsening despite injection, would refer to ortho. Pt agrees with plan.

## 2014-02-28 NOTE — Progress Notes (Signed)
Pre visit review using our clinic review tool, if applicable. No additional management support is needed unless otherwise documented below in the visit note. 

## 2014-02-28 NOTE — Progress Notes (Signed)
BP 136/84 mmHg  Pulse 68  Temp(Src) 97.8 F (36.6 C) (Oral)  Wt 166 lb (75.297 kg)   CC: shoulder steroid injection Subjective:    Patient ID: Victor Cortez, male    DOB: March 27, 1950, 64 y.o.   MRN: [id number]  HPI: [name] is a 64 y.o. male presenting on 02/28/2014 for Shoulder Pain   Seen here 02/23/2014 with R shoulder pain x 2 months, thought RTC syndrome (irritation, possible partial tear) + biceps tendonitis given tender to palpation at biceps tendon + shoulder arthritis. Provided with stretching exercises from [facility] pt advisor. Recommended continue advil, add on ultracet for pain, heating pad/ice for symptomatic relief  Advil/ultracet not effective for pain control. Did shovel snow over weekend, and may have aggravated shoulder 2/2 this.  RIGHT SHOULDER - 2+ VIEW COMPARISON: None FINDINGS: Three views of the right shoulder submitted. No acute fracture or subluxation. Mild narrowing of subacromial space. Mild degenerative changes AC joint. Mild spurring of humeral head. IMPRESSION: No acute fracture or subluxation. Osteoarthritic changes as described above. Electronically Signed  By: Lahoma Crocker M.D.  On: 02/23/2014 11:18  Relevant past medical, surgical, family and social history reviewed and updated as indicated. Interim medical history since our last visit reviewed. Allergies and medications reviewed and updated. Current Outpatient Prescriptions on File Prior to Visit  Medication Sig  . aspirin EC 81 MG tablet Take by mouth. 1 tab by mouth in AM  . Cyanocobalamin (RA VITAMIN B-12 TR) 1000 MCG TBCR Take by mouth. One tab by mouth daily  . fluticasone (FLOVENT HFA) 110 MCG/ACT inhaler Inhale 2 puffs into the lungs 2 (two) times daily.  . Glucosamine-Chondroit-Vit C-Mn (GLUCOSAMINE 1500 COMPLEX) CAPS Take 1 capsule by mouth daily.  . Ipratropium-Albuterol (COMBIVENT RESPIMAT) 20-100 MCG/ACT AERS respimat Inhale 1 puff into the lungs every 6 (six) hours as needed.  One puff four times a day  . latanoprost (XALATAN) 0.005 % ophthalmic solution One drop in both eyes at bedtime  . metroNIDAZOLE (METROCREAM) 0.75 % cream Apply 1 application topically 2 (two) times daily as needed.  . Multiple Vitamin (MULTI-VITAMINS) TABS Take by mouth. 1 tab by mouth daily  . Omega-3 Fatty Acids (FISH OIL) 1000 MG CAPS Take by mouth. 1 cap by mouth daily  . pantoprazole (PROTONIX) 40 MG tablet Take 1 tablet (40 mg total) by mouth daily.  . sotalol (BETAPACE) 80 MG tablet Take by mouth. One tab by mouth twice a day  . traMADol-acetaminophen (ULTRACET) 37.5-325 MG per tablet Take 1 tablet by mouth 2 (two) times daily as needed for moderate pain (sedation precautions).   No current facility-administered medications on file prior to visit.    Review of Systems Per HPI unless specifically indicated above     Objective:    BP 136/84 mmHg  Pulse 68  Temp(Src) 97.8 F (36.6 C) (Oral)  Wt 166 lb (75.297 kg)  Wt Readings from Last 3 Encounters:  02/28/14 166 lb (75.297 kg)  02/23/14 165 lb 12 oz (75.184 kg)  07/11/13 156 lb (70.761 kg)    Physical Exam  Constitutional: He appears well-developed and well-nourished. No distress.  Musculoskeletal:  FROM at shoulders bilaterally, although some pain with full abduction on right No erythema, warmth No pain at shoulder bursa  Nursing note and vitals reviewed.  Right shoulder steroid injection: IC obtained and in chart.  Landmarks palpated and area cleaned with EtOH wipe. Ethyl chloride for numbing. Using posterior lateral approach, steroid injection today - 1cc depo  medrol (40mg ), 4cc 1% lidocaine using 22g 1.5in needle.  Pt tolerated well.     Assessment & Plan:   Problem List Items Addressed This Visit    Right anterior shoulder pain - Primary    Steroid injection performed today for RTC tendonitis/inflammation.  Discussed monitoring for signs of developing joint infection after steroid injection. Discussed  possible steroid flare after shot. If not improving with injection, pt will notify me for PT referral. If worsening despite injection, would refer to ortho. Pt agrees with plan.          Follow up plan: No Follow-up on file.

## 2014-03-02 DIAGNOSIS — I4891 Unspecified atrial fibrillation: Secondary | ICD-10-CM | POA: Diagnosis not present

## 2014-03-02 DIAGNOSIS — I340 Nonrheumatic mitral (valve) insufficiency: Secondary | ICD-10-CM | POA: Diagnosis not present

## 2014-03-02 DIAGNOSIS — I10 Essential (primary) hypertension: Secondary | ICD-10-CM | POA: Diagnosis not present

## 2014-03-14 DIAGNOSIS — H2513 Age-related nuclear cataract, bilateral: Secondary | ICD-10-CM | POA: Diagnosis not present

## 2014-03-14 DIAGNOSIS — H4011X1 Primary open-angle glaucoma, mild stage: Secondary | ICD-10-CM | POA: Diagnosis not present

## 2014-05-15 ENCOUNTER — Other Ambulatory Visit: Payer: Self-pay | Admitting: Family Medicine

## 2014-05-22 DIAGNOSIS — M25511 Pain in right shoulder: Secondary | ICD-10-CM | POA: Diagnosis not present

## 2014-05-22 DIAGNOSIS — M7551 Bursitis of right shoulder: Secondary | ICD-10-CM | POA: Diagnosis not present

## 2014-05-26 ENCOUNTER — Other Ambulatory Visit: Payer: Self-pay | Admitting: Orthopaedic Surgery

## 2014-05-26 DIAGNOSIS — M25511 Pain in right shoulder: Secondary | ICD-10-CM

## 2014-05-27 NOTE — Consult Note (Signed)
PATIENT NAME:  Victor Cortez, Victor Cortez MR#:  [id number] DATE OF BIRTH:  12-21-1950  DATE OF CONSULTATION:  06/05/2013  CONSULTING PHYSICIAN:  Dionisio David, MD  INDICATION FOR CONSULTATION: Atrial fibrillation and chest pain.   HISTORY OF PRESENT ILLNESS: This is a 64 year old white male with a past medical history of COPD, glaucoma, psoriasis, who presented to the Emergency Room with chest pain. The chest pain was described as sharp to pressure-type associated with shortness of breath. He also had white count of 39,000 and creatinine of 1.7. Chest x-ray showed pneumonia. He was being treated on the floor and then developed atrial fibrillation this morning with palpitations and chest pain recurring this morning.   PAST MEDICAL HISTORY: As above home.   MEDICATIONS: At home were Combivent inhaler, Flovent.   SOCIAL HISTORY: Continues to smoke 1 pack per day. No EtOH abuse.   FAMILY HISTORY: Positive for COPD.   PHYSICAL EXAMINATION: GENERAL: He is alert, oriented x 3, in mild distress.  VITAL SIGNS: His heart rate is 160, monitor shows atrial fibrillation. Blood pressure is 128/75, respirations 20. His saturation is 93.  NECK: No JVD.  LUNGS: Good air entry.  HEART: Irregularly irregular. Normal S1, S2. No audible murmur.  ABDOMEN: Soft, nontender, positive bowel sounds.  EXTREMITIES: No pedal edema.   LABS/STUDIES: EKG shows atrial fibrillation, 140 beats per minute, nonspecific ST-T changes. His white count is 39,000, hemoglobin is 13.1, BUN is 21, creatinine 1.72. Troponin is negative. Chest x-ray shows lingular opacity suggestive of pneumonia.   ASSESSMENT AND PLAN: The patient has atypical chest pain, most likely secondary to pneumonia, now has new onset atrial fibrillation. His CHADS score is 1. Advise giving heparin and amiodarone. Heparin since he is having mild renal insufficiency, started him on IV amiodarone. He may need to be moved to step down or telemetry to get IV amiodarone to  convert him to sinus rhythm.   Thank you very much for the referral.    ____________________________ Dionisio David, MD sak:sg D: 06/05/2013 10:39:01 ET T: 06/05/2013 11:06:58 ET JOB#: [id number]  cc: Dionisio David, MD, <Dictator> Dionisio David MD ELECTRONICALLY SIGNED 06/10/2013 11:07

## 2014-05-27 NOTE — H&P (Signed)
PATIENT NAME:  Victor Cortez, Victor Cortez MR#:  [id number] DATE OF BIRTH:  [birth year]  DATE OF ADMISSION:  06/04/2013  PRIMARY CARE PHYSICIAN: None.  CHIEF COMPLAINT: Chest pain.   HISTORY OF PRESENT ILLNESS: This is a 64 year old man who has been having constant chest pain for the past 1 or 2 days, described as sharp, radiating, left side of his chest, described 5 out of 10 in intensity, better after coming to the ER. He has also had some dizziness and some shortness of breath. In the ER, he was found to be tachycardic, had an elevated white count of 39,000, acute renal failure with a creatinine of 1.72, chest x-ray showing pneumonia. Hospitalist services were contacted for further evaluation. On further questioning, the patient does have a cough which is dry and nonproductive, some chills.   PAST MEDICAL HISTORY:  1. COPD. 2. Glaucoma.  3. Psoriasis.   PAST SURGICAL HISTORY: None.   ALLERGIES: No known drug allergies.   MEDICATIONS: Include.  1. Combivent inhaler. 2. Flovent inhaler.  3. Latanoprost eyedrops at night, both eyes.   SOCIAL HISTORY: Smokes 1 pack per day. No alcohol. No drug use. 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   FAMILY HISTORY: Both parents died of COPD.  REVIEW OF SYSTEMS:  CONSTITUTIONAL: Positive for chills. No fever. No sweats. Positive for fatigue and weakness.  EYES: He does wear glasses. Has glaucoma and cataracts.  CARDIOVASCULAR: Positive for chest pain. No palpitations.  RESPIRATORY: Positive for shortness of breath, cough, dry. No hemoptysis.  GASTROINTESTINAL: No nausea. No vomiting. No abdominal pain. No diarrhea. No constipation. No bright red blood per rectum. No melena.  GENITOURINARY: No burning on urination. No hematuria.  MUSCULOSKELETAL: Positive for joint pain all over.  INTEGUMENT: Positive for psoriasis on his feet.  PSYCHIATRIC: No anxiety or depression.  ENDOCRINE: No  thyroid problems.  HEMATOLOGIC AND LYMPHATIC: No anemia.   PHYSICAL EXAMINATION:  VITAL SIGNS: Temperature 98.2, pulse 86, respirations 18, blood pressure 105/66, pulse oximetry 94% on oxygen. On presentation to the ER, his pulse was 101 and blood pressure 93/56.  GENERAL: No respiratory distress.  EYES: Conjunctivae and lids normal. Pupils equal, round and reactive to light. Extraocular muscles intact. No nystagmus.  EARS, NOSE, MOUTH AND THROAT: Tympanic membranes: No erythema. Nasal mucosa: No erythema. Throat: Slight erythema. No exudate seen. Lips and gums: No lesions.  NECK: No JVD. No bruits. No lymphadenopathy. No thyromegaly. No thyroid nodules palpated.  RESPIRATORY: Decreased breath sounds bilaterally. No use of accessory muscles to breathe. Positive rhonchi, left lung base.  CARDIOVASCULAR: S1, S2 normal. No gallops, rubs or murmurs heard. Carotid upstroke 2+ bilaterally. No bruits. Dorsalis pedis pulses 2+ bilaterally. All pulses equal throughout upper and lower extremities.  ABDOMEN: Soft, nontender. No organomegaly/splenomegaly. Normoactive bowel sounds. No masses felt.  LYMPHATIC: No lymph nodes in the neck.  MUSCULOSKELETAL: No clubbing, edema or cyanosis.  SKIN: No ulcers or lesions.  NEUROLOGICAL: Cranial nerves II through XII grossly intact. Deep tendon reflexes 2+ bilateral lower extremities.  PSYCHIATRIC: The patient is oriented to person, place and time.   LABORATORY AND RADIOLOGICAL DATA: Chest x-ray showed heterogeneous opacity in the lingula concerning for pneumonia. Glucose 135, BUN 21, creatinine 1.72, sodium 136, potassium 3.7, chloride 100, CO2 of 25, calcium 8.9. Liver function tests normal range. White blood cell count 39.0, H and H 13.1 and 39.4, platelet count 253. Troponin negative. D-dimer 579, which  is a little borderline. Differential shows 14% bands, 73% segmented neutrophils, lymphocytes 7%. EKG: Normal sinus rhythm, 94 beats per minute. No acute ST-T wave  changes.   ASSESSMENT AND PLAN:  1. Clinical sepsis with tachycardia, leukocytosis with left shift and bands, pneumonia on chest x-ray. Will get blood cultures x2 and give antibiotics.  2. Pneumonia. Will give Rocephin and Zithromax. Blood cultures x2. Sputum culture. Will also give IV Solu-Medrol.  3. Acute renal failure with dehydration and relative hypotension. Will give IV fluid hydration and check a creatinine in the a.m.  4. History of chronic obstructive pulmonary disease. Will give IV Solu-Medrol with the pleuritic chest pain and see if that improves things.  5. Glaucoma. Continue latanoprost.  6. Pleuritic chest pain, likely secondary to pneumonia. Will get serial cardiac enzymes and put on telemetry.  7. Impaired fasting glucose. Will monitor with a hemoglobin A1c in the a.m.    TIME SPENT ON ADMISSION: 50 minutes.   ____________________________ Tana Conch. Leslye Peer, MD rjw:lb D: 06/04/2013 11:33:54 ET T: 06/04/2013 12:35:25 ET JOB#: [id number]  cc: Tana Conch. Leslye Peer, MD, <Dictator> Primary Care Physician Marisue Brooklyn MD ELECTRONICALLY SIGNED 06/06/2013 10:36

## 2014-05-27 NOTE — Discharge Summary (Signed)
PATIENT NAME:  Victor Cortez, BABY MR#:  [id number] DATE OF BIRTH:  03/11/1950  DATE OF ADMISSION:  06/04/2013 DATE OF DISCHARGE:  06/07/2013  PRESENTING COMPLAINT: Chest pain.   DISCHARGE DIAGNOSES:  1. Sepsis secondary to pneumonia.  2. Atrial fibrillation with rapid ventricular response, new onset, now in sinus rhythm.  3. Type 2 diabetes, diet controlled.  4. Abnormal thyroid labs, likely euthyroid sick syndrome.  5. Chronic smoker.  6. Saturations 98% on room air.   CODE STATUS: Full code.   MEDICATIONS:  1. Combivent 1 puff 4 times a day as needed.  2. Flovent 110 mcg/inhalation 2 puffs b.i.d.  3. Latanoprost 0.005% one drop to affected eye daily at bedtime.  4. Aspirin 81 mg daily.  5. Fish oil 1000 mg p.o. daily.  6. Multivitamin p.o. daily.  7. Vitamin B12 p.o. daily.  8. Sotalol 80 mg b.i.d.  9. Keflex 500 mg 3 times a day.  10. Zithromax 500 mg daily.  11. Carbohydrate -controlled diet.   FOLLOWUP:  1. Follow up with Dr. Neoma Laming in 1-2 weeks.  2. Follow up with Dr. Gabriel Carina in 2-4 weeks.  3. The patient to make appointment with Dr. Stevie Kern at [facility].   LABORATORY AND RADIOLOGICAL DATA: White count at discharge was 13.7. Echo Doppler showed EF of 45%-50%, elevated mean left atrial pressure, moderately enlarged right ventricle, moderately dilated left atrium and right atrium, moderate mitral valve regurgitation, moderate to severe tricuspid regurgitation, mildly elevated pulmonary artery systolic pressure.  Creatinine 1.1. Free T3 is 1.9, free T4 is 0.9. TSH is 0.31. Hemoglobin A1c is 6.4. Sputum shows heavy growth of  Streptococcus pneumoniae. Blood cultures negative in 48 hours. Chest x-ray shows heterogenous opacity in the lingula concerning for pneumonia on the left. White count on admission was 39,000.   BRIEF SUMMARY OF HOSPITAL COURSE:  The patient is a pleasant 64 year old Caucasian gentleman with history of ongoing tobacco abuse comes to  the Emergency Room after he came in with chest pain and shortness of breath.  He was found to have:  1. Clinical sepsis secondary to left lingula pneumonia. The patient came in with sepsis, with tachycardia, leukocytosis left shift with bands. His chest x-ray showed pneumonia.  He was admitted on the medical floor. Blood cultures remained negative. White count was 39,000, came down to 13,000. He was started on IV Rocephin and Zithromax, changed to Keflex and p.o. Zithromax. His sputum was positive for Streptococcus pneumoniae.   2. Left lingular pneumonia with chronic obstructive pulmonary disease, mild flare. The patient's sputum culture was positive for Streptococcus pneumoniae.  He will finish up a course of antibiotics for 7-8 days.  3. Atrial fibrillation with rapid ventricular response due to sepsis and abnormal thyroid labs. The patient was seen by Dr. Neoma Laming. He was started on IV amiodarone drip and was changed to p.o. sotalol. The patient is in sinus rhythm.  Was continued on aspirin per Dr. Laurelyn Sickle recommendation.  He will follow up with Dr. Humphrey Rolls as outpatient. Echo results as above were noted.  4. Abnormal thyroid labs.  Appears to be sick thyroid. The patient was seen by Dr. Gabriel Carina. Recommends repeat labs as outpatient. No further work-up at present needed.  5. Type 2 diabetes, mild. A1c was 6.4.  Dietary restrictions advised. The patient was placed on sliding scale insulin and diabetes inpatient education was done. The patient is not going to be discharged on any oral p.o. diabetic medications at present.  Hospital stay otherwise remained stable.   CODE STATUS: The patient remained a full code.   TIME SPENT: 40 minutes.   ____________________________ Hart Rochester Posey Pronto, MD sap:dd D: 06/07/2013 14:08:44 ET T: 06/07/2013 23:21:26 ET JOB#: [id number]  cc: Alwin Lanigan A. Posey Pronto, MD, <Dictator> A. Lavone Orn, MD Dionisio David, MD Jory Ee. Sherren Mocha, MD Ilda Basset MD ELECTRONICALLY SIGNED  06/26/2013 16:14

## 2014-05-27 NOTE — Consult Note (Signed)
PATIENT NAME:  Victor Cortez, Victor Cortez MR#:  [id number] DATE OF BIRTH:  1950/10/14  DATE OF CONSULTATION:  06/06/2013  REFERRING PHYSICIAN:  Fritzi Mandes, MD CONSULTING PHYSICIAN:  A. Lavone Orn, MD  CHIEF COMPLAINT: Low TSH.   HISTORY OF PRESENT ILLNESS: This is a 65 year old male seen in consultation at the request of Dr. Posey Pronto for abnormal thyroid function tests. The patient was admitted on Jun 04, 2013 with shortness of breath. He was found to have a lingular pneumonia and yesterday developed atrial fibrillation with rapid ventricular response. The patient was started on amiodarone. Labs yesterday notable for an low TSH of 0.231 uIU/mL. Labs today notable for a normal FT4 level of 0.90 ng/dL. The patient has no known history of thyroid disease. He denies any neck pain. He does report palpitations for several weeks preceding hospitalization, brought on by exertion. Denies tremor. Denies heat intolerance. No recent weight loss. Denies exposure to lithium or iodinated contrast dye.   A second issue is mildly elevated blood sugars. Hemoglobin A1c is 6.4% Over the last 24 hours, fingerstick blood sugars have been in the range of 140-303. He has no history of diabetes to his knowledge. He has been receiving high-dose steroids for his COPD exacerbation.   PAST MEDICAL HISTORY:  1.  COPD.  2.  Tobacco dependence.  3.  Glaucoma.   ALLERGIES: No known drug allergies.   PAST SURGICAL HISTORY: None.   SOCIAL HISTORY: The patient smokes 1 pack per day. He recently retired. Denies use of alcohol.   FAMILY HISTORY: Parents both with COPD. No known diabetes. No known heart disease.   CURRENT MEDICATIONS:  1.  Azithromycin 500 mg daily.  2.  Ceftriaxone 1 gram daily.  3.  Aspirin 81 mg daily.  4.  Glipizide 2.5 mg before breakfast.  5.  NovoLog insulin sliding scale.  6.  Combivent inhaler 1 puff q.i.d.  7.  Flovent 220 mcg inhaler 1 puff b.i.d.  8.  Spiriva inhaler 1 capsule daily.   REVIEW OF  SYSTEMS:  GENERAL: No fevers. No chills.  HEENT: He does have blurred vision and attributes this to chronic glaucoma. Denies sore throat.  NECK: Denies neck pain and denies dysphasia.  CARDIAC: Reports palpitations. Denies chest pain.  PULMONARY: Reports cough and shortness of breath.  ABDOMEN: Reports good appetite. Denies nausea and vomiting.  EXTREMITIES: Denies any swelling.  GENITOURINARY: Denies dysuria or hematuria.  HEMATOLOGIC: Denies easy bruisability or recent bleeding. ENDOCRINE: Denies heat or cold intolerance.  NEUROLOGIC: Denies recent falls or headaches.   PHYSICAL EXAMINATION:  VITAL SIGNS: Height 65.8 inches, weight 146 pounds, BMI 23.7. Temperature 98.4, pulse 90, respirations 22, blood pressure 126/71, O2 sat 98% on room air.  GENERAL: Well-developed white male in no acute distress.  HEENT: EOMI. Oropharynx is clear.  NECK: Supple. No neck tenderness. No thyroid bruit.  CARDIAC: Irregularly irregular. No murmur. No carotid bruit.  PULMONARY: Decreased breath sounds throughout. Positive wheeze.  ABDOMEN: Diffusely soft, nontender.  EXTREMITIES: No peripheral edema is present.  NEUROLOGIC: No tremor of the outstretched hands is present. No dysphagia or noted neurologic defects.   LABORATORY DATA: Creatinine 1.19, eGFR greater than 60, WBC 27.6. Thyroid labs noted in HPI.   ASSESSMENT: A 64 year old male admitted 2 days ago with sepsis due to pneumonia. Subsequently developed atrial fibrillation, which has been persistent, now rate controlled. Also with chronic obstructive pulmonary disease exacerbation, requiring high-dose steroids and hyperglycemia attributed to steroids. His low TSH in the setting of multiple medical  conditions is attributed to silent thyroiditis.   RECOMMENDATIONS:  1.  Expect his thyroid function tests to normalize as his clinical status improves. I do plan to check a total T3 level to rule out T3 toxicosis.  2.  Recommend a repeat thyroid function  tests as an outpatient 3-4 weeks. The patient was agreeable to follow up with me to have that done and I will have it scheduled.  3.  Hyperglycemia in setting of high-dose steroids. Hemoglobin A1c of 6.4% is consistent with pre-diabetes. Likely control this with diet alone as an outpatient. While glipizide may be helpful during hospitalization and during course of steroids, he may not need oral antidiabetic medications long term.  4.  Continue regular fingerstick blood sugar monitoring and NovoLog sliding scale as needed.  5.  Recommend low carbohydrate diet.  6.  I agree with diabetes education.  7.  Recommended smoking cessation.   I will follow along with you.  ____________________________ A. Lavone Orn, MD ams:aw D: 06/06/2013 13:47:34 ET T: 06/06/2013 14:29:45 ET JOB#: [id number]  cc: A. Lavone Orn, MD, <Dictator> Sherlon Handing MD ELECTRONICALLY SIGNED 06/08/2013 21:45

## 2014-06-06 ENCOUNTER — Ambulatory Visit
Admission: RE | Admit: 2014-06-06 | Discharge: 2014-06-06 | Disposition: A | Discharge status: Home / Self Care | Payer: Medicare Other | Source: Ambulatory Visit | Attending: Orthopaedic Surgery | Admitting: Orthopaedic Surgery

## 2014-06-06 DIAGNOSIS — M19011 Primary osteoarthritis, right shoulder: Secondary | ICD-10-CM | POA: Insufficient documentation

## 2014-06-06 DIAGNOSIS — S4381XA Sprain of other specified parts of right shoulder girdle, initial encounter: Secondary | ICD-10-CM | POA: Diagnosis not present

## 2014-06-06 DIAGNOSIS — M25511 Pain in right shoulder: Secondary | ICD-10-CM | POA: Diagnosis present

## 2014-06-12 DIAGNOSIS — M25511 Pain in right shoulder: Secondary | ICD-10-CM | POA: Diagnosis not present

## 2014-06-12 DIAGNOSIS — M7521 Bicipital tendinitis, right shoulder: Secondary | ICD-10-CM | POA: Diagnosis not present

## 2014-06-13 DIAGNOSIS — H2513 Age-related nuclear cataract, bilateral: Secondary | ICD-10-CM | POA: Diagnosis not present

## 2014-06-13 DIAGNOSIS — H4011X1 Primary open-angle glaucoma, mild stage: Secondary | ICD-10-CM | POA: Diagnosis not present

## 2014-06-22 DIAGNOSIS — M19011 Primary osteoarthritis, right shoulder: Secondary | ICD-10-CM | POA: Diagnosis not present

## 2014-06-22 DIAGNOSIS — Y929 Unspecified place or not applicable: Secondary | ICD-10-CM | POA: Diagnosis not present

## 2014-06-22 DIAGNOSIS — M7501 Adhesive capsulitis of right shoulder: Secondary | ICD-10-CM | POA: Diagnosis not present

## 2014-06-22 DIAGNOSIS — S46111D Strain of muscle, fascia and tendon of long head of biceps, right arm, subsequent encounter: Secondary | ICD-10-CM | POA: Diagnosis not present

## 2014-06-22 DIAGNOSIS — S46201A Unspecified injury of muscle, fascia and tendon of other parts of biceps, right arm, initial encounter: Secondary | ICD-10-CM | POA: Diagnosis not present

## 2014-06-22 DIAGNOSIS — M7541 Impingement syndrome of right shoulder: Secondary | ICD-10-CM | POA: Diagnosis not present

## 2014-06-22 DIAGNOSIS — X58XXXA Exposure to other specified factors, initial encounter: Secondary | ICD-10-CM | POA: Diagnosis not present

## 2014-06-22 DIAGNOSIS — G8918 Other acute postprocedural pain: Secondary | ICD-10-CM | POA: Diagnosis not present

## 2014-06-22 DIAGNOSIS — M75111 Incomplete rotator cuff tear or rupture of right shoulder, not specified as traumatic: Secondary | ICD-10-CM | POA: Diagnosis not present

## 2014-06-22 DIAGNOSIS — M779 Enthesopathy, unspecified: Secondary | ICD-10-CM | POA: Diagnosis not present

## 2014-07-10 ENCOUNTER — Ambulatory Visit: Payer: Medicare Other | Attending: Orthopaedic Surgery

## 2014-07-10 DIAGNOSIS — R293 Abnormal posture: Secondary | ICD-10-CM | POA: Insufficient documentation

## 2014-07-10 DIAGNOSIS — M25511 Pain in right shoulder: Secondary | ICD-10-CM | POA: Diagnosis not present

## 2014-07-10 DIAGNOSIS — M25611 Stiffness of right shoulder, not elsewhere classified: Secondary | ICD-10-CM | POA: Diagnosis not present

## 2014-07-10 NOTE — Therapy (Signed)
[place] MAIN [facility] SERVICES [address] [place], [place], [address] Phone: [phone]   Fax:  [phone]  Physical Therapy Evaluation  Patient Details  Name: Victor Cortez MRN: [id number] Date of Birth: Aug 29, 1950 Referring Provider:  Garald Balding, MD  Encounter Date: 07/10/2014      PT End of Session - 07/10/14 1008    Visit Number 1   Number of Visits 9   Date for PT Re-Evaluation 08/07/14   Authorization Type Gcode 1/10   PT Start Time 0902   PT Stop Time 0950   PT Time Calculation (min) 48 min   Activity Tolerance Patient tolerated treatment well;Patient limited by pain   Behavior During Therapy [facility] for tasks assessed/performed      Past Medical History  Diagnosis Date  . History of pneumonia 06/2013    with sepsis and [facility] hospitalization  . Atrial fibrillation with RVR 06/2013    during hospitalization  . COPD (chronic obstructive pulmonary disease)   . Glaucoma     Dr. Matilde Sprang  . Hyperlipidemia   . History of chicken pox   . GERD (gastroesophageal reflux disease)   . Ex-smoker   . Seborrheic dermatitis   . ED (erectile dysfunction)   . Rosacea   . Basal cell carcinoma of nose 1998    History reviewed. No pertinent past surgical history.  There were no vitals filed for this visit.  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 pt started noticing R shoulder pain for about 6 months. pt reports he got a cortizone injection which did not help much. pt was then referred to orthopedist who performed a R shoulder arthroscopy, bicep tenodesis, distal clavicle excision, and sub-acromial decompression 06/22/14. pt reports no complications from surgery. pt  reports overall he is just sore, but improving. pt denies numbness/tingling.    Patient Stated Goals pt would like would be able to work in the yard.    Currently in Pain? Yes   Pain Score 5    Pain Location Shoulder   Pain Orientation Right   Pain Descriptors / Indicators Aching            [facility] PT Assessment - 07/10/14 0001    Assessment   Medical Diagnosis R shoulder scope   Onset Date/Surgical Date 06/22/14   Hand Dominance Right   Precautions   Precautions None   Balance Screen   Has the patient fallen in the past 6 months No   Has the patient had a decrease in activity level because of a fear of falling?  No   Is the patient reluctant to leave their home because of a fear of falling?  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   Vocation Part time employment   Cognition   Overall Cognitive Status Within Functional Limits for tasks assessed   Observation/Other Assessments   Skin Integrity  well healing scars, mild bruising to R biceps, no signs of infection   ROM / Strength   AROM / PROM / Strength AROM;PROM   AROM   Overall AROM  Deficits   AROM Assessment Site Shoulder;Elbow   Right/Left Shoulder Right   Right Shoulder Flexion 90 Degrees   Right Shoulder ABduction 65 Degrees   Right Shoulder Internal Rotation --  to back pocket   Right Shoulder External Rotation 60 Degrees   Right/Left Elbow Right   Right Elbow Extension --  wnl   PROM   Overall PROM Comments shoulder flexion 100 deg, abduction 90 deg, ER 60   Flexibility   Soft Tissue Assessment /Muscle Length yes    R bicep with increased tightness   Outcome measures: quickdash: 95% disability                [facility]  Adult PT Treatment/Exercise - 2014/07/27 0001    Exercises   Exercises Shoulder   Shoulder Exercises: Supine   External Rotation AAROM;Right;10 reps  cane   Flexion AAROM;10 reps  pvc pipe   Shoulder Exercises: Seated   Retraction AROM;Right;Left;10 reps   Flexion AAROM;Right;10 reps  tables slides   Abduction AROM;Right;Left;10 reps  table slides                PT Education - 07-27-2014 1006    Education provided Yes   Education Details cryotherapy, weening off sling   Person(s) Educated Patient   Methods Explanation   Comprehension Verbalized understanding             PT Long Term Goals - 07/27/14 1013    PT LONG TERM GOAL #1   Title pt will increased active shoulder flexion to 140 deg to be able put dishes away.    Baseline 100deg   Time 4   Period Weeks   Status New   PT LONG TERM GOAL #2   Title pt will be able to improve shoulder IR to WNL to be able to put his belt on without compensation    Baseline IR to back pocket   Time 4   Period Weeks   Status New   PT LONG TERM GOAL #3   Title pt will be able to carry 15lb object x 20 feet using RUE to be able to do yard work.    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   Time 4   Period Weeks   Status New               Plan - 2014-07-27 1009    Clinical Impression Statement pt doing well s/p R shoulder arthroscopy preformed 06/22/14. pt has no evidence of infection, pain is well controlled. pt demonstrates impaired ROM, strength, posture, pain, and impaired functional use of the R UE.    Pt will benefit from skilled therapeutic intervention in order to improve on the following deficits Pain;Impaired flexibility;Decreased strength;Postural dysfunction;Impaired UE functional use;Decreased range of motion   Rehab Potential Excellent   PT Frequency 2x / week   PT Duration 4  weeks   PT Treatment/Interventions Dry needling;Moist Heat;Therapeutic exercise;Manual techniques;Taping;Cryotherapy;Scar mobilization;Neuromuscular re-education;Patient/family education;Therapeutic activities;Iontophoresis 4mg /ml Dexamethasone;Electrical Stimulation          G-Codes - 2014/07/27 1018    Functional Assessment Tool Used quick dash,  clinical judgement    Functional Limitation Carrying, moving and handling objects   Carrying, Moving and Handling Objects Current Status [phone]) At least 80 percent but less than 100 percent impaired, limited or restricted   Carrying, Moving and Handling Objects Goal Status (G3875) At least 1 percent but less than 20 percent impaired, limited or restricted       Problem List Patient Active Problem List   Diagnosis Date Noted  . Right anterior shoulder pain 02/23/2014  . Renal insufficiency 02/23/2014  . Ex-smoker 07/11/2013  . COPD (chronic obstructive pulmonary disease)   . Glaucoma   . Hyperlipidemia   . GERD (gastroesophageal reflux disease)   . History of pneumonia 06/03/2013  . 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 Bhavin Monjaraz, PT, DPT [phone]  Dayami Taitt 07/10/2014, 10:31 AM  [place] MAIN [facility] SERVICES [address] [place], [place], [address] Phone: [phone]   Fax:  [phone]

## 2014-07-10 NOTE — Patient Instructions (Signed)
[email or web address]: Pt issued: Supine flexion/ER AAROM using cane x 10, table slides flexion/abduction x 10 , bicep curl 0lbs x 10, scap retraction x 10 - 2x/day

## 2014-07-12 ENCOUNTER — Ambulatory Visit: Payer: Medicare Other

## 2014-07-12 DIAGNOSIS — R293 Abnormal posture: Secondary | ICD-10-CM

## 2014-07-12 DIAGNOSIS — M25511 Pain in right shoulder: Secondary | ICD-10-CM | POA: Diagnosis not present

## 2014-07-12 DIAGNOSIS — M25611 Stiffness of right shoulder, not elsewhere classified: Secondary | ICD-10-CM

## 2014-07-12 NOTE — Therapy (Signed)
[place] MAIN [facility] SERVICES [address] [place], [place], [address] Phone: [phone]   Fax:  [phone]  Physical Therapy Treatment  Patient Details  Name: Victor Cortez MRN: [id number] Date of Birth: 03/04/1950 Referring Provider:  Ria Bush, MD  Encounter Date: 07/12/2014      PT End of Session - 07/12/14 0847    Visit Number 2   Number of Visits 9   Date for PT Re-Evaluation 08/07/14   Authorization Type 2/10   PT Start Time 0805   PT Stop Time 0845   PT Time Calculation (min) 40 min   Activity Tolerance Patient tolerated treatment well   Behavior During Therapy [facility] for tasks assessed/performed      Past Medical History  Diagnosis Date  . History of pneumonia 06/2013    with sepsis and [facility] hospitalization  . Atrial fibrillation with RVR 06/2013    during hospitalization  . COPD (chronic obstructive pulmonary disease)   . Glaucoma     Dr. Matilde Sprang  . Hyperlipidemia   . History of chicken pox   . GERD (gastroesophageal reflux disease)   . Ex-smoker   . Seborrheic dermatitis   . ED (erectile dysfunction)   . Rosacea   . Basal cell carcinoma of nose 1998    History reviewed. No pertinent past surgical history.  There were no vitals filed for this visit.  Visit Diagnosis:  Pain in joint, shoulder region, right  Shoulder stiffness, right  Abnormal posture      Subjective Assessment - 07/12/14 0846    Subjective pt reports his shoulder is a little sore. has been compliant with new HEP, has a few questions.    Patient Stated Goals pt would like would be able to work in the yard.    Currently in Pain? Yes   Pain Score 3    Pain Location Shoulder   Pain Orientation Right      Treatment: Manual therapy: Soft tissue massage (STM) including efflurage, muscle kneading and muscle stripping to R deltoid, R biceps and upper trap including ischemic trigger point release to the upper trap Grade 2 inferior& AP,  joint mobilizations to [facility] joint 30s oscillations x 3-4 rounds in flexion/ abduction followed by PROM of the shoulder into flexion, abduction, ER/IR with gentle over pressure and small oscillations at end range x 5-10 reps each Min-mod cues to relax during manual therapy. Pt still had some muscle guarding during PROM and joint mobs , but improved throughout session   Therex: Shoulder ER AAROM with cane in supine with min verbal/tactile cues x 12 Shoulder flexion AAROM in supine x 5 with min cues Sitting ABCs with UE Ranger A-Z Pt demonstrates improved ROM with exercises and less pain                            PT Education - 07/12/14 0847    Education provided Yes   Education Details HEP review of shoulder ER AAROM and flexion AAROM using stick   Person(s) Educated Patient   Methods Explanation;Tactile cues   Comprehension Verbalized understanding;Returned demonstration             PT Long Term Goals - 07/10/14 1013    PT LONG TERM GOAL #1   Title pt will increased active shoulder flexion to 140 deg to be able put dishes away.    Baseline 100deg   Time 4   Period Weeks  Status New   PT LONG TERM GOAL #2   Title pt will be able to improve shoulder IR to WNL to be able to put his belt on without compensation    Baseline IR to back pocket   Time 4   Period Weeks   Status New   PT LONG TERM GOAL #3   Title pt will be able to carry 15lb object x 20 feet using RUE to be able to do yard work.    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   Time 4   Period Weeks   Status New               Plan - 07/12/14 0848    Clinical Impression Statement pt responded well to manual therapy, having reduced pain and improved ROM following session today. reviwed HEP to insure proper performance.    Pt will benefit from skilled  therapeutic intervention in order to improve on the following deficits Pain;Impaired flexibility;Decreased strength;Postural dysfunction;Impaired UE functional use;Decreased range of motion   Rehab Potential Excellent   PT Frequency 2x / week   PT Duration 4 weeks   PT Treatment/Interventions Dry needling;Moist Heat;Therapeutic exercise;Manual techniques;Taping;Cryotherapy;Scar mobilization;Neuromuscular re-education;Patient/family education;Therapeutic activities;Iontophoresis 4mg /ml Dexamethasone;Electrical Stimulation   PT Next Visit Plan assess shoulder tightness/ROM, progress exercises as tolerated        Problem List Patient Active Problem List   Diagnosis Date Noted  . Right anterior shoulder pain 02/23/2014  . Renal insufficiency 02/23/2014  . Ex-smoker 07/11/2013  . COPD (chronic obstructive pulmonary disease)   . Glaucoma   . Hyperlipidemia   . GERD (gastroesophageal reflux disease)   . History of pneumonia 06/03/2013  . 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 Suhail Peloquin, PT, DPT [phone]  Harmony Sandell 07/12/2014, 8:51 AM  [place] MAIN [facility] SERVICES [address] [place], [place], [address] Phone: [phone]   Fax:  [phone]

## 2014-07-18 ENCOUNTER — Ambulatory Visit: Payer: Medicare Other

## 2014-07-18 DIAGNOSIS — M25511 Pain in right shoulder: Secondary | ICD-10-CM

## 2014-07-18 DIAGNOSIS — M25611 Stiffness of right shoulder, not elsewhere classified: Secondary | ICD-10-CM | POA: Diagnosis not present

## 2014-07-18 DIAGNOSIS — R293 Abnormal posture: Secondary | ICD-10-CM

## 2014-07-18 NOTE — Therapy (Signed)
[place] MAIN [facility] SERVICES [address] [place], [place], [address] Phone: [phone]   Fax:  [phone]  Physical Therapy Treatment  Patient Details  Name: ZIAIR PENSON MRN: [id number] Date of Birth: 1950-04-09 Referring Provider:  Ria Bush, MD  Encounter Date: 07/18/2014      PT End of Session - 07/18/14 0854    Visit Number 3   Number of Visits 9   Date for PT Re-Evaluation 08/07/14   Authorization Type 3/10   PT Start Time 0805   PT Stop Time 0835   PT Time Calculation (min) 30 min   Activity Tolerance Patient tolerated treatment well  reduced pain following treatment.    Behavior During Therapy [facility] for tasks assessed/performed      Past Medical History  Diagnosis Date  . History of pneumonia 06/2013    with sepsis and [facility] hospitalization  . Atrial fibrillation with RVR 06/2013    during hospitalization  . COPD (chronic obstructive pulmonary disease)   . Glaucoma     Dr. Matilde Sprang  . Hyperlipidemia   . History of chicken pox   . GERD (gastroesophageal reflux disease)   . Ex-smoker   . Seborrheic dermatitis   . ED (erectile dysfunction)   . Rosacea   . Basal cell carcinoma of nose 1998    History reviewed. No pertinent past surgical history.  There were no vitals filed for this visit.  Visit Diagnosis:  Pain in joint, shoulder region, right  Shoulder stiffness, right  Abnormal posture      Subjective Assessment - 07/18/14 0853    Subjective pt reports he was working outside in the garden yesterday, pulling a hose etc, and his shoulder is pretty sore.    Patient Stated Goals pt would like would be able to work in the yard.    Currently in Pain? Yes   Pain Score 6    Pain Location Shoulder   Pain Orientation Right   Pain Descriptors / Indicators Aching      Treatment: Manual therapy: Soft tissue massage to R biceps, deltoid and pectoralis including efflurage and muscle stripping. Trigger point  release (ischemic) to R upper trap Grade 2-3, AP, inferior, lateral and long axis distraction of the GH joint 30 s bouts of 30-4 each with shoulder in open packed position. Then end range mobilizations with small oscillations at end range into flexion, abduction and ER 15s bouts x 3 each, end range mobilization with movement same as above  PROM into end ranges of flexion, abduction IR/ER x 5-10 each with gentle over pressure Pt has tight/capsular end feel and and ranges. Pt demonstrate full passive ER following treatment, flexion PROM of the shoulder 130 deg, abduction 110 degrees. Pt needs cues to relax throughout session                           PT Education - 07/18/14 0853    Education provided Yes   Education Details rest today with ice, resume HEP as tolerated tomorrow   Person(s) Educated Patient   Methods Explanation   Comprehension Verbalized understanding             PT Long Term Goals - 07/10/14 1013    PT LONG TERM GOAL #1   Title pt will increased active shoulder flexion to 140 deg to be able put dishes away.    Baseline 100deg   Time 4   Period Weeks  Status New   PT LONG TERM GOAL #2   Title pt will be able to improve shoulder IR to WNL to be able to put his belt on without compensation    Baseline IR to back pocket   Time 4   Period Weeks   Status New   PT LONG TERM GOAL #3   Title pt will be able to carry 15lb object x 20 feet using RUE to be able to do yard work.    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   Time 4   Period Weeks   Status New               Plan - 07/18/14 0854    Clinical Impression Statement modified treatment today due to increased pain likely due to overuse yesterday which included lifting/ and pulling hose etc in the garden. focused on ROM, joint mobility and soft tissue  mobility in session today and held exercises. pt instructed to rest arm today and resume HEP as tolerated tomorrow.    Pt will benefit from skilled therapeutic intervention in order to improve on the following deficits Pain;Impaired flexibility;Decreased strength;Postural dysfunction;Impaired UE functional use;Decreased range of motion   Rehab Potential Excellent   PT Frequency 2x / week   PT Duration 4 weeks   PT Treatment/Interventions Dry needling;Moist Heat;Therapeutic exercise;Manual techniques;Taping;Cryotherapy;Scar mobilization;Neuromuscular re-education;Patient/family education;Therapeutic activities;Iontophoresis 4mg /ml Dexamethasone;Electrical Stimulation   PT Next Visit Plan assess shoulder tightness/ROM, progress exercises as tolerated        Problem List Patient Active Problem List   Diagnosis Date Noted  . Right anterior shoulder pain 02/23/2014  . Renal insufficiency 02/23/2014  . Ex-smoker 07/11/2013  . COPD (chronic obstructive pulmonary disease)   . Glaucoma   . Hyperlipidemia   . GERD (gastroesophageal reflux disease)   . History of pneumonia 06/03/2013  . 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 Mardy Hoppe, PT, DPT [phone]  Navreet Bolda 07/18/2014, 8:56 AM  [place] MAIN [facility] SERVICES [address] [place], [place], [address] Phone: [phone]   Fax:  [phone]

## 2014-07-20 ENCOUNTER — Ambulatory Visit: Payer: Medicare Other

## 2014-07-20 DIAGNOSIS — R293 Abnormal posture: Secondary | ICD-10-CM

## 2014-07-20 DIAGNOSIS — M25511 Pain in right shoulder: Secondary | ICD-10-CM

## 2014-07-20 DIAGNOSIS — M25611 Stiffness of right shoulder, not elsewhere classified: Secondary | ICD-10-CM

## 2014-07-20 NOTE — Patient Instructions (Signed)
[email or web address] Pt issued flexion/abduction wall slides

## 2014-07-20 NOTE — Therapy (Signed)
[place] MAIN [facility] SERVICES [address] [place], [place], [address] Phone: [phone]   Fax:  [phone]  Physical Therapy Treatment  Patient Details  Name: Victor Cortez MRN: [id number] Date of Birth: 05-Jul-1950 Referring Provider:  Ria Bush, MD  Encounter Date: 07/20/2014      PT End of Session - 07/20/14 1132    Visit Number 4   Number of Visits 9   Date for PT Re-Evaluation 08/07/14   Authorization Type 4/10   PT Start Time 0945   PT Stop Time 1028   PT Time Calculation (min) 43 min   Activity Tolerance Patient tolerated treatment well  reduced pain following treatment.    Behavior During Therapy [facility] for tasks assessed/performed      Past Medical History  Diagnosis Date  . History of pneumonia 06/2013    with sepsis and [facility] hospitalization  . Atrial fibrillation with RVR 06/2013    during hospitalization  . COPD (chronic obstructive pulmonary disease)   . Glaucoma     Dr. Matilde Sprang  . Hyperlipidemia   . History of chicken pox   . GERD (gastroesophageal reflux disease)   . Ex-smoker   . Seborrheic dermatitis   . ED (erectile dysfunction)   . Rosacea   . Basal cell carcinoma of nose 1998    History reviewed. No pertinent past surgical history.  There were no vitals filed for this visit.  Visit Diagnosis:  Pain in joint, shoulder region, right  Shoulder stiffness, right  Abnormal posture      Subjective Assessment - 07/20/14 1127    Subjective pt reports he is still pretty sore. reports shoulder pain 3/10 to the lateral deltoid.    Patient Stated Goals pt would like would be able to work in the yard.    Currently in Pain? Yes   Pain Score 3    Pain Location --  L shoulder       manual therapy including  Soft tissue massage to R biceps, deltoid and pectoralis including efflurage and muscle stripping. Trigger point release (ischemic) to R upper trap Grade 2-3, AP, inferior, lateral and long axis  distraction of the GH joint 30 s bouts of 30-4 each with shoulder in open packed position. Then end range mobilizations with small oscillations at end range into flexion, abduction and ER 15s bouts x 3 each, end range mobilization with movement same as above  PROM into end ranges of flexion, abduction IR/ER x 5-10 each with gentle over pressure Patient received dry needling therapy education and acknowledged understanding of risks and benefits of dry needling therapy prior to receiving treatment. Patient voiced understanding of treatment options and elected to proceed with dry needling therapy.  Trigger point dry needling to R middle deltoid followed my soft tissue mobilization including efflurage and muscle stripping  Pt needs cues to relax during manual therapy Therex:  Upper trap stretch 30s x 2 Low row yellow band 2x10 Wall slides flexion/abduction x 10 each Cues to reduce shoulder shrug                                PT Long Term Goals - 07/10/14 1013    PT LONG TERM GOAL #1   Title pt will increased active shoulder flexion to 140 deg to be able put dishes away.    Baseline 100deg   Time 4   Period Weeks  Status New   PT LONG TERM GOAL #2   Title pt will be able to improve shoulder IR to WNL to be able to put his belt on without compensation    Baseline IR to back pocket   Time 4   Period Weeks   Status New   PT LONG TERM GOAL #3   Title pt will be able to carry 15lb object x 20 feet using RUE to be able to do yard work.    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   Time 4   Period Weeks   Status New               Plan - 07/20/14 1133    Clinical Impression Statement pt responded well to manual therapy including trigger point dry needling with less pain and increased ROM. pt overall had some reduction in ROM  today vs. last visit due to pain/tightness. did progress HEP to include wall slides (shoulder AAROM in standin) and upper trap stretch.    Pt will benefit from skilled therapeutic intervention in order to improve on the following deficits Pain;Impaired flexibility;Decreased strength;Postural dysfunction;Impaired UE functional use;Decreased range of motion   Rehab Potential Excellent   PT Frequency 2x / week   PT Duration 4 weeks   PT Treatment/Interventions Dry needling;Moist Heat;Therapeutic exercise;Manual techniques;Taping;Cryotherapy;Scar mobilization;Neuromuscular re-education;Patient/family education;Therapeutic activities;Iontophoresis 4mg /ml Dexamethasone;Electrical Stimulation   PT Next Visit Plan assess shoulder tightness/ROM, progress exercises as tolerated        Problem List Patient Active Problem List   Diagnosis Date Noted  . Right anterior shoulder pain 02/23/2014  . Renal insufficiency 02/23/2014  . Ex-smoker 07/11/2013  . COPD (chronic obstructive pulmonary disease)   . Glaucoma   . Hyperlipidemia   . GERD (gastroesophageal reflux disease)   . History of pneumonia 06/03/2013  . 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 Arth Nicastro, PT, DPT [phone]  Victor Cortez 07/20/2014, 11:35 AM  [place] MAIN [facility] SERVICES [address] [place], [place], [address] Phone: [phone]   Fax:  [phone]

## 2014-07-25 ENCOUNTER — Ambulatory Visit: Payer: Medicare Other

## 2014-07-25 DIAGNOSIS — R293 Abnormal posture: Secondary | ICD-10-CM | POA: Diagnosis not present

## 2014-07-25 DIAGNOSIS — M25511 Pain in right shoulder: Secondary | ICD-10-CM

## 2014-07-25 DIAGNOSIS — M25611 Stiffness of right shoulder, not elsewhere classified: Secondary | ICD-10-CM | POA: Diagnosis not present

## 2014-07-25 NOTE — Patient Instructions (Signed)
[email or web address] Pt issued prone shoulder ext 2x10 sidelying shoulder ER 2x10 Doorway stretch 30s 2

## 2014-07-25 NOTE — Therapy (Signed)
[place] MAIN [facility] SERVICES [address] [place], [place], [address] Phone: [phone]   Fax:  [phone]  Physical Therapy Treatment  Patient Details  Name: Victor Cortez MRN: [id number] Date of Birth: Oct 17, 1950 Referring Provider:  Ria Bush, MD  Encounter Date: 07/25/2014      PT End of Session - 07/25/14 1058    Visit Number 5   Number of Visits 9   Date for PT Re-Evaluation 08/07/14   Authorization Type 5/10   PT Start Time 0900   PT Stop Time 0950   PT Time Calculation (min) 50 min   Activity Tolerance Patient tolerated treatment well  reduced pain following treatment.    Behavior During Therapy [facility] for tasks assessed/performed      Past Medical History  Diagnosis Date  . History of pneumonia 06/2013    with sepsis and [facility] hospitalization  . Atrial fibrillation with RVR 06/2013    during hospitalization  . COPD (chronic obstructive pulmonary disease)   . Glaucoma     Dr. Matilde Sprang  . Hyperlipidemia   . History of chicken pox   . GERD (gastroesophageal reflux disease)   . Ex-smoker   . Seborrheic dermatitis   . ED (erectile dysfunction)   . Rosacea   . Basal cell carcinoma of nose 1998    History reviewed. No pertinent past surgical history.  There were no vitals filed for this visit.  Visit Diagnosis:  Shoulder stiffness, right  Pain in joint, shoulder region, right  Abnormal posture      Subjective Assessment - 07/25/14 1054    Subjective pt reports he is able to raise his arm more, but has instances where it feels like its catching with sharp pain.    Patient Stated Goals pt would like would be able to work in the yard.    Currently in Pain? Yes   Pain Score 3    Pain Location --  R anteriolateral shoulder      Manual therapy  Soft tissue massage to deltoid and pectoralis including efflurage and muscle stripping. Trigger point release (ischemic) to R upper trap Grade 2-3, AP, inferior,  lateral and long axis distraction of the GH joint 30 s bouts of 30-4 each with shoulder in open packed position. Then end range mobilizations with small oscillations at end range into flexion, abduction and ER 15s bouts x 3 each, end range mobilization with movement same as above  PROM into end ranges of flexion, abduction IR/ER x 5-10 each with gentle over pressure Patient received dry needling therapy education and acknowledged understanding of risks and benefits of dry needling therapy prior to receiving treatment. Patient voiced understanding of treatment options and elected to proceed with dry needling therapy.  Trigger point dry needling to R middle deltoid followed my soft tissue mobilization including efflurage and muscle stripping  Pt needs cues to relax during manual therapy  Therex: sidelying shoulder ER 2x10 sidelying shoulder abduction 2x10 Prone shoulder mid row 2x10 Prone shoulder extension 2x10 Wall slide x 10, cues to reduce shrug                               PT Long Term Goals - 07/10/14 1013    PT LONG TERM GOAL #1   Title pt will increased active shoulder flexion to 140 deg to be able put dishes away.    Baseline 100deg   Time 4  Period Weeks   Status New   PT LONG TERM GOAL #2   Title pt will be able to improve shoulder IR to WNL to be able to put his belt on without compensation    Baseline IR to back pocket   Time 4   Period Weeks   Status New   PT LONG TERM GOAL #3   Title pt will be able to carry 15lb object x 20 feet using RUE to be able to do yard work.    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   Time 4   Period Weeks   Status New               Plan - 07/25/14 1058    Clinical Impression Statement pt responded well to manual therapy including TrP DN to the deltoid with improved ability  to perform progressed therex including sidelying ER/abduction after initially having pain with these. progressed HEP to include sidelying ER and prone row. pt does have increased tightness of the pectoralis minor as well which may need further intervention.    Pt will benefit from skilled therapeutic intervention in order to improve on the following deficits Pain;Impaired flexibility;Decreased strength;Postural dysfunction;Impaired UE functional use;Decreased range of motion   Rehab Potential Excellent   PT Frequency 2x / week   PT Duration 4 weeks   PT Treatment/Interventions Dry needling;Moist Heat;Therapeutic exercise;Manual techniques;Taping;Cryotherapy;Scar mobilization;Neuromuscular re-education;Patient/family education;Therapeutic activities;Iontophoresis 4mg /ml Dexamethasone;Electrical Stimulation   PT Next Visit Plan assess shoulder tightness/ROM, progress exercises as tolerated        Problem List Patient Active Problem List   Diagnosis Date Noted  . Right anterior shoulder pain 02/23/2014  . Renal insufficiency 02/23/2014  . Ex-smoker 07/11/2013  . COPD (chronic obstructive pulmonary disease)   . Glaucoma   . Hyperlipidemia   . GERD (gastroesophageal reflux disease)   . History of pneumonia 06/03/2013  . 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 Rose Hegner, PT, DPT [phone]  Cecily Lawhorne 07/25/2014, 11:06 AM  [place] MAIN [facility] SERVICES [address] [place], [place], [address] Phone: [phone]   Fax:  [phone]

## 2014-07-27 ENCOUNTER — Ambulatory Visit: Payer: Medicare Other

## 2014-07-27 DIAGNOSIS — R293 Abnormal posture: Secondary | ICD-10-CM

## 2014-07-27 DIAGNOSIS — M25611 Stiffness of right shoulder, not elsewhere classified: Secondary | ICD-10-CM | POA: Diagnosis not present

## 2014-07-27 DIAGNOSIS — M25511 Pain in right shoulder: Secondary | ICD-10-CM | POA: Diagnosis not present

## 2014-07-27 NOTE — Therapy (Addendum)
[place] MAIN [facility] SERVICES [address] [place], [place], [address] Phone: [phone]   Fax:  [phone]  Physical Therapy Treatment  Patient Details  Name: Victor Cortez MRN: [id number] Date of Birth: 09/02/1950 Referring Provider:  Ria Bush, MD  Encounter Date: 07/27/2014      PT End of Session - 07/27/14 1114    Visit Number 6   Number of Visits 9   Date for PT Re-Evaluation 08/07/14   Authorization Type 1/10   PT Start Time 0900   PT Stop Time 0940   PT Time Calculation (min) 40 min   Activity Tolerance Patient tolerated treatment well  reduced pain following treatment.    Behavior During Therapy [facility] for tasks assessed/performed      Past Medical History  Diagnosis Date  . History of pneumonia 06/2013    with sepsis and [facility] hospitalization  . Atrial fibrillation with RVR 06/2013    during hospitalization  . COPD (chronic obstructive pulmonary disease)   . Glaucoma     Dr. Matilde Sprang  . Hyperlipidemia   . History of chicken pox   . GERD (gastroesophageal reflux disease)   . Ex-smoker   . Seborrheic dermatitis   . ED (erectile dysfunction)   . Rosacea   . Basal cell carcinoma of nose 1998    History reviewed. No pertinent past surgical history.  There were no vitals filed for this visit.  Visit Diagnosis:  Shoulder stiffness, right  Pain in joint, shoulder region, right  Abnormal posture      Subjective Assessment - 07/27/14 1109    Subjective pt reports his arm is sore today and continues to feel like his arm is catching with sharp pain when he raises his arm.     Patient Stated Goals pt would like would be able to work in the yard.    Pain Score 3    Pain Location Arm   Pain Orientation Right   Pain Descriptors / Indicators Sore       Manual therapy:  Passive shoulder flexion/abduction ROM with contract relax technique  to improve shoulder range of motion Right shoulder mobilizations:  posterior/inforior/lateral glides grade 1-2, 3x30 sec to improve shoulder mobility Passive external rotation ROM 2x30 sec Soft tissue mobilization (effleurage) to biceps to decrease tension, pain, and improve shoulder mobility   Reassess right shoulder ROM Active Flexion: 124 Passive flexion: 136 Active abduction: 90 Passive abduction:  126 Active internal rotation:  40 degrees Active internal rotation: to L3-L4                            PT Education - 07/27/14 1113    Education provided Yes   Education Details contract-relax muscle energy technique can increase motion in his arm.    Person(s) Educated Patient   Methods Explanation   Comprehension Verbalized understanding             PT Long Term Goals - 07/27/14 1338    PT LONG TERM GOAL #1   Title pt will increased active shoulder flexion to 140 deg to be able put dishes away.    Time 4   Period Weeks   Status Partially Met   PT LONG TERM GOAL #2   Title pt will be able to improve shoulder IR to WNL to be able to put his belt on without compensation    Baseline IR to L4   Time 4  Period Weeks   Status Partially Met   PT LONG TERM GOAL #3   Title pt will be able to carry 15lb object x 20 feet using RUE to be able to do yard work.    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   Time 4   Period Weeks   Status Partially Met               Plan - August 19, 2014 1249    Clinical Impression Statement Pt responded well with manual therapy which consisted of passive ROM, contract relax, joint glldes and soft tissue moblizaiton by experiencing a decrease in pain in his right shoulder.pt is making good progress towards goals and has improved ROM of the shoulder. Pt is still struggling with contractile pain with active ROM.    Pt will benefit from skilled  therapeutic intervention in order to improve on the following deficits Pain;Impaired flexibility;Decreased strength;Postural dysfunction;Impaired UE functional use;Decreased range of motion   Rehab Potential Excellent   PT Frequency 2x / week   PT Duration 4 weeks   PT Treatment/Interventions Dry needling;Moist Heat;Therapeutic exercise;Manual techniques;Taping;Cryotherapy;Scar mobilization;Neuromuscular re-education;Patient/family education;Therapeutic activities;Iontophoresis 16m/ml Dexamethasone;Electrical Stimulation   PT Next Visit Plan assess shoulder tightness/ROM, progress exercises as tolerated          G-Codes - [id number]    Functional Assessment Tool Used quick dash, clinical judgement, ?ROM   Functional Limitation Carrying, moving and handling objects   Carrying, Moving and Handling Objects Current Status [phone] At least 40 percent but less than 60 percent impaired, limited or restricted   Carrying, Moving and Handling Objects Goal Status (([id number] At least 1 percent but less than 20 percent impaired, limited or restricted      Problem List Patient Active Problem List   Diagnosis Date Noted  . Right anterior shoulder pain 02/23/2014  . Renal insufficiency 02/23/2014  . Ex-smoker 07/11/2013  . COPD (chronic obstructive pulmonary disease)   . Glaucoma   . Hyperlipidemia   . GERD (gastroesophageal reflux disease)   . History of pneumonia 06/03/2013  . 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 I have personally read, edited and approve of the note as written. AGorden Harms Tortorici, PT, DPT #[id number] [place] [facility] [address] [address]Phone: [phone]  Fax:  [phone]

## 2014-08-01 ENCOUNTER — Ambulatory Visit: Payer: Medicare Other

## 2014-08-01 DIAGNOSIS — M25511 Pain in right shoulder: Secondary | ICD-10-CM

## 2014-08-01 DIAGNOSIS — M25611 Stiffness of right shoulder, not elsewhere classified: Secondary | ICD-10-CM | POA: Diagnosis not present

## 2014-08-01 DIAGNOSIS — R293 Abnormal posture: Secondary | ICD-10-CM | POA: Diagnosis not present

## 2014-08-01 NOTE — Patient Instructions (Signed)
[email or web address] Shoulder flexion and abduction AAROM with pulleys 2x10; 2-3x/day

## 2014-08-01 NOTE — Therapy (Signed)
[place] MAIN [facility] SERVICES [address] [place], [place], [address] Phone: [phone]   Fax:  [phone]  Physical Therapy Treatment  Patient Details  Name: Victor Cortez MRN: [id number] Date of Birth: 07-03-1950 Referring Provider:  Ria Bush, MD  Encounter Date: 08/01/2014      PT End of Session - 08/01/14 1050    Visit Number 7   Number of Visits 9   Date for PT Re-Evaluation 08/07/14   Authorization Type 7/10   PT Start Time 0850   PT Stop Time 0940   PT Time Calculation (min) 50 min   Activity Tolerance Patient tolerated treatment well  reduced pain following treatment.    Behavior During Therapy [facility] for tasks assessed/performed      Past Medical History  Diagnosis Date  . History of pneumonia 06/2013    with sepsis and [facility] hospitalization  . Atrial fibrillation with RVR 06/2013    during hospitalization  . COPD (chronic obstructive pulmonary disease)   . Glaucoma     Dr. Matilde Sprang  . Hyperlipidemia   . History of chicken pox   . GERD (gastroesophageal reflux disease)   . Ex-smoker   . Seborrheic dermatitis   . ED (erectile dysfunction)   . Rosacea   . Basal cell carcinoma of nose 1998    History reviewed. No pertinent past surgical history.  There were no vitals filed for this visit.  Visit Diagnosis:  Shoulder stiffness, right  Pain in joint, shoulder region, right  Abnormal posture      Subjective Assessment - 08/01/14 1044    Subjective pt reports he saw his surgeon who reports he is doing well and to continue working on improving ROM to prevent frozen shoulder or the need for a manipulation pt relates he does not have any pain currenlty and feels like he has more ROM.    Patient Stated Goals pt would like would be able to work in the yard.    Currently in Pain? No/denies   Pain Score 0-No pain      Manual therapy  Soft tissue massage (effeurage and muscle stripping) Grade 2-3, AP, inferior,  lateral and long axis distraction of the GH joint 40 sec x 3 .  end range mobilizations with small oscillations at end range into flexion, abduction 10s bouts x 3 each,  PROM into end ranges of flexion, abduction, ER x 5-10 each with over pressure  Therex: Shoulder abduction/flexoin AAROM with pulleys 3x10, pt required verbal and tactile cues to prevent right shoulder shrug  Shoulder extension with yellow theraband x10                                     PT Education - 08/01/14 1049    Education provided Yes   Education Details how to perform AAROM with pulleys to improve ROM   Person(s) Educated Patient   Methods Explanation;Demonstration   Comprehension Verbalized understanding;Returned demonstration             PT Long Term Goals - 07/27/14 1338    PT LONG TERM GOAL #1   Title pt will increased active shoulder flexion to 140 deg to be able put dishes away.    Time 4   Period Weeks   Status Partially Met   PT LONG TERM GOAL #2   Title pt will be able to improve shoulder IR to  WNL to be able to put his belt on without compensation    Baseline IR to L4   Time 4   Period Weeks   Status Partially Met   PT LONG TERM GOAL #3   Title pt will be able to carry 15lb object x 20 feet using RUE to be able to do yard work.    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   Time 4   Period Weeks   Status Partially Met               Plan - 08/01/14 1053    Clinical Impression Statement Pt responded well with manual therapy which consisted of passive ROM, AAROm with pulleys, joint glldes and soft tissue moblizaiton by experiencing a an increase in motion with less pain in his right shoulder.pt is making good progress towards goals and has improved ROM of the shoulder.    Pt will benefit from skilled therapeutic  intervention in order to improve on the following deficits Pain;Impaired flexibility;Decreased strength;Postural dysfunction;Impaired UE functional use;Decreased range of motion   Rehab Potential Excellent   PT Frequency 2x / week   PT Duration 4 weeks   PT Treatment/Interventions Dry needling;Moist Heat;Therapeutic exercise;Manual techniques;Taping;Cryotherapy;Scar mobilization;Neuromuscular re-education;Patient/family education;Therapeutic activities;Iontophoresis 61m/ml Dexamethasone;Electrical Stimulation   PT Next Visit Plan assess shoulder tightness/ROM, progress exercises as tolerated        Problem List Patient Active Problem List   Diagnosis Date Noted  . Right anterior shoulder pain 02/23/2014  . Renal insufficiency 02/23/2014  . Ex-smoker 07/11/2013  . COPD (chronic obstructive pulmonary disease)   . Glaucoma   . Hyperlipidemia   . GERD (gastroesophageal reflux disease)   . History of pneumonia 06/03/2013  . 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 I have personally read, edited and approve of the note as written. AGorden Harms Tortorici, PT, DPT #[id number] [place] RLac/[facility] CenterSERVICES [address] [address]Phone: [phone]  Fax:  [phone]

## 2014-08-03 ENCOUNTER — Ambulatory Visit: Payer: Medicare Other

## 2014-08-03 DIAGNOSIS — M25511 Pain in right shoulder: Secondary | ICD-10-CM

## 2014-08-03 DIAGNOSIS — M25611 Stiffness of right shoulder, not elsewhere classified: Secondary | ICD-10-CM

## 2014-08-03 DIAGNOSIS — R293 Abnormal posture: Secondary | ICD-10-CM | POA: Diagnosis not present

## 2014-08-03 NOTE — Therapy (Signed)
[place] MAIN [facility] SERVICES [address] [place], [place], [address] Phone: [phone]   Fax:  [phone]  Physical Therapy Treatment  Patient Details  Name: Victor Cortez MRN: [id number] Date of Birth: [birth year] Referring Provider:  Ria Bush, MD  Encounter Date: 08/03/2014      PT End of Session - 08/03/14 1047    Visit Number 8   Number of Visits 17   Date for PT Re-Evaluation 08/31/14   Authorization Type 3/10   PT Start Time 0900   PT Stop Time 0945   PT Time Calculation (min) 45 min   Activity Tolerance Patient tolerated treatment well  reduced pain following treatment.    Behavior During Therapy [facility] for tasks assessed/performed      Past Medical History  Diagnosis Date  . History of pneumonia 06/2013    with sepsis and [facility] hospitalization  . Atrial fibrillation with RVR 06/2013    during hospitalization  . COPD (chronic obstructive pulmonary disease)   . Glaucoma     Dr. Matilde Sprang  . Hyperlipidemia   . History of chicken pox   . GERD (gastroesophageal reflux disease)   . Ex-smoker   . Seborrheic dermatitis   . ED (erectile dysfunction)   . Rosacea   . Basal cell carcinoma of nose 1998    History reviewed. No pertinent past surgical history.  There were no vitals filed for this visit.  Visit Diagnosis:  Shoulder stiffness, right  Pain in joint, shoulder region, right  Abnormal posture      Subjective Assessment - 08/03/14 1043    Subjective pt reports he is doing well this morning and has minimal pain of 2/10 in his right shoulder.  pt relates he has been doing his HEP and using the pulleys has been going well.    Patient Stated Goals pt would like would be able to work in the yard.    Pain Score 2    Pain Location Shoulder   Pain Orientation Right      Manual therapy:  Soft tissue mobilization (effleurage, muscle stripping) to right biceps and upper trap Patient received dry needling therapy  education and acknowledged understanding of risks and benefits of dry needling therapy prior to receiving treatment. Patient voiced understanding of treatment options and elected to proceed with dry needling therapy.   Dry needling to trigger points in his biceps, deltoid and upper trap performed by DPT  Passive shoulder flexion with over pressure 5x30 sec Passive shoulder abduction with over pressure 5x30 sec Passive shoulder ER with over pressure 3x20 sec AP glenohumeral glides grade 2-3 with 3 bouts of 20-30 sec  There ex: Wall slides with lift x8, pt required verbal and tactile cues to decrease shoulder shrug  D1 extension with yellow theraband 2x10, pt required verbal and tactile cues for correct technique Shoulder extension with yellow theraband, 2x10, pt required verbal cues for correct technique  Shoulder ROM Active abduction: 100 degrees Active flexion: 130                           PT Education - 08/03/14 1045    Education provided Yes   Education Details shoulder hiking while performing his exercises may be contributing to his upper trap pain and to focus on keeping his shoulder down.    Person(s) Educated Patient   Methods Explanation   Comprehension Verbalized understanding  PT Long Term Goals - 07/27/14 1338    PT LONG TERM GOAL #1   Title pt will increased active shoulder flexion to 140 deg to be able put dishes away.    Time 4   Period Weeks   Status Partially Met   PT LONG TERM GOAL #2   Title pt will be able to improve shoulder IR to WNL to be able to put his belt on without compensation    Baseline IR to L4   Time 4   Period Weeks   Status Partially Met   PT LONG TERM GOAL #3   Title pt will be able to carry 15lb object x 20 feet using RUE to be able to do yard work.    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   Time 4   Period Weeks   Status Partially Met               Plan - 08/03/14 1051    Clinical Impression Statement pt presents improved motion(130 degrees active shoulder flexion and 100 degrees of active shoulder abduction) and less pain post manual therpay (soft tissue mobilization, dry needling, joint mobs).  pt has difficulty preventing shoulder hiking/shrug with progression of strength exercises. pt is progressing well with PT and has paritally met his goals. pt would benefit from continued skilled PT services to make further gains.     Pt will benefit from skilled therapeutic intervention in order to improve on the following deficits Pain;Impaired flexibility;Decreased strength;Postural dysfunction;Impaired UE functional use;Decreased range of motion   Rehab Potential Excellent   PT Frequency 2x / week   PT Duration 4 weeks   PT Treatment/Interventions Dry needling;Moist Heat;Therapeutic exercise;Manual techniques;Taping;Cryotherapy;Scar mobilization;Neuromuscular re-education;Patient/family education;Therapeutic activities;Iontophoresis 24m/ml Dexamethasone;Electrical Stimulation   PT Next Visit Plan assess shoulder tightness/ROM, progress exercises as tolerated        Problem List Patient Active Problem List   Diagnosis Date Noted  . Right anterior shoulder pain 02/23/2014  . Renal insufficiency 02/23/2014  . Ex-smoker 07/11/2013  . COPD (chronic obstructive pulmonary disease)   . Glaucoma   . Hyperlipidemia   . GERD (gastroesophageal reflux disease)   . History of pneumonia 06/03/2013  . 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 I have personally read, edited and approve of the note as written. AGorden Harms Tortorici, PT, DPT #[id number] CNew [place]MAIN REndoscopic Diagnostic And Treatment CenterSERVICES [address] [address]Phone: [phone]  Fax:  [phone]

## 2014-08-08 ENCOUNTER — Ambulatory Visit: Payer: Medicare Other | Attending: Orthopaedic Surgery

## 2014-08-08 DIAGNOSIS — R293 Abnormal posture: Secondary | ICD-10-CM | POA: Diagnosis not present

## 2014-08-08 DIAGNOSIS — M25611 Stiffness of right shoulder, not elsewhere classified: Secondary | ICD-10-CM | POA: Diagnosis not present

## 2014-08-08 DIAGNOSIS — M25511 Pain in right shoulder: Secondary | ICD-10-CM | POA: Insufficient documentation

## 2014-08-08 NOTE — Therapy (Signed)
[place] MAIN [facility] SERVICES [place], [place], [address] Phone: [phone]   Fax:  [phone]  Physical Therapy Treatment  Patient Details  Name: Victor Cortez MRN: [id number] Date of Birth: March 27, 1950 Referring Provider:  Garald Balding, MD  Encounter Date: 08/08/2014      PT End of Session - 08/08/14 1016    Visit Number 9   Number of Visits 17   Date for PT Re-Evaluation 08/31/14   Authorization Type 4/10   PT Start Time 0854   PT Stop Time 0945   PT Time Calculation (min) 51 min   Activity Tolerance Patient tolerated treatment well   Behavior During Therapy [facility] for tasks assessed/performed      Past Medical History  Diagnosis Date  . History of pneumonia 06/2013    with sepsis and [facility] hospitalization  . Atrial fibrillation with RVR 06/2013    during hospitalization  . COPD (chronic obstructive pulmonary disease)   . Glaucoma     Dr. Matilde Sprang  . Hyperlipidemia   . History of chicken pox   . GERD (gastroesophageal reflux disease)   . Ex-smoker   . Seborrheic dermatitis   . ED (erectile dysfunction)   . Rosacea   . Basal cell carcinoma of nose 1998    History reviewed. No pertinent past surgical history.  There were no vitals filed for this visit.  Visit Diagnosis:  Shoulder stiffness, right  Pain in joint, shoulder region, right  Abnormal posture      Subjective Assessment - 08/08/14 1012    Subjective pt relates he is doing well and denies any current pain at rest.  pt reports his shoulder ROM is improving and is doing HEP.    Patient Stated Goals pt would like would be able to work in the yard.    Currently in Pain? No/denies   Pain Score 0-No pain           Manual therapy:  Soft tissue mobilization (effleurage, muscle stripping) to right biceps  Passive shoulder flexion with over pressure 3x30 sec Passive shoulder abduction with over pressure 3x30 sec Contract relax shoulder flexion  stretch x 2 min Contract relax shoulder abduction stretch x 2 min Passive shoulder ER with over pressure 2x20 sec AP glenohumeral glides grade 2-3 with 3 bouts of 20-30 sec  There ex: UE D1 extension in supine x10 UE D2 flexion in supine 2x10 active assist  Wall slides with lift 2x10, pt required verbal and tactile cues to decrease shoulder shrug  Right bicep curls with red theraband 2x10 Rows with yellow band 2x10, pt required verbal and tactile cues to decrease shoulder shrug D1 extension with yellow theraband 2x10, pt required verbal and tactile cues for correct technique Shoulder extension with yellow theraband, 2x10, pt required verbal cues for correct technique                       PT Education - 08/08/14 1014    Education provided Yes   Education Details to continue cross friction massage to proximal biceps tendon to decresae scar tissue and pain.    Person(s) Educated Patient   Methods Explanation;Demonstration   Comprehension Verbalized understanding;Returned demonstration             PT Long Term Goals - 07/27/14 1338    PT LONG TERM GOAL #1   Title pt will increased active shoulder flexion to 140 deg to be able put dishes  away.    Time 4   Period Weeks   Status Partially Met   PT LONG TERM GOAL #2   Title pt will be able to improve shoulder IR to WNL to be able to put his belt on without compensation    Baseline IR to L4   Time 4   Period Weeks   Status Partially Met   PT LONG TERM GOAL #3   Title pt will be able to carry 15lb object x 20 feet using RUE to be able to do yard work.    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   Time 4   Period Weeks   Status Partially Met               Plan - 08/08/14 1018    Clinical Impression Statement pt experienced minimal-moderate pain with progression  of strength and ROM exercises during session that decreased quickly after performing the exercise.  pt requires verbal and tactile cues from hiking/shrugging his right shoulder during exercises.  pt would benefit from continued skilled PT services to improve ROM and strength   Pt will benefit from skilled therapeutic intervention in order to improve on the following deficits Pain;Impaired flexibility;Decreased strength;Postural dysfunction;Impaired UE functional use;Decreased range of motion   Rehab Potential Excellent   PT Frequency 2x / week   PT Duration 4 weeks   PT Treatment/Interventions Dry needling;Moist Heat;Therapeutic exercise;Manual techniques;Taping;Cryotherapy;Scar mobilization;Neuromuscular re-education;Patient/family education;Therapeutic activities;Iontophoresis 66m/ml Dexamethasone;Electrical Stimulation   PT Next Visit Plan assess shoulder tightness/ROM, progress exercises as tolerated        Problem List Patient Active Problem List   Diagnosis Date Noted  . Right anterior shoulder pain 02/23/2014  . Renal insufficiency 02/23/2014  . Ex-smoker 07/11/2013  . COPD (chronic obstructive pulmonary disease)   . Glaucoma   . Hyperlipidemia   . GERD (gastroesophageal reflux disease)   . History of pneumonia 06/03/2013  . 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

## 2014-08-10 ENCOUNTER — Ambulatory Visit: Payer: Medicare Other

## 2014-08-10 DIAGNOSIS — M25511 Pain in right shoulder: Secondary | ICD-10-CM | POA: Diagnosis not present

## 2014-08-10 DIAGNOSIS — R293 Abnormal posture: Secondary | ICD-10-CM

## 2014-08-10 DIAGNOSIS — M25611 Stiffness of right shoulder, not elsewhere classified: Secondary | ICD-10-CM

## 2014-08-10 NOTE — Therapy (Signed)
[name] [facility] SERVICES [address] [place], [place], [address] Phone: [phone]   Fax:  [phone]  Physical Therapy Treatment  Patient Details  Name: Victor Cortez MRN: [id number] Date of Birth: [birth year] Referring Provider:  Ria Bush, MD  Encounter Date: 08/10/2014      PT End of Session - 08/10/14 1242    Visit Number 10   Number of Visits 17   Date for PT Re-Evaluation 08/31/14   Authorization Type 5/10   PT Start Time 0900   PT Stop Time 0945   PT Time Calculation (min) 45 min   Activity Tolerance Patient tolerated treatment well   Behavior During Therapy [facility] for tasks assessed/performed      Past Medical History  Diagnosis Date  . History of pneumonia 06/2013    with sepsis and [facility] hospitalization  . Atrial fibrillation with RVR 06/2013    during hospitalization  . COPD (chronic obstructive pulmonary disease)   . Glaucoma     Dr. Matilde Sprang  . Hyperlipidemia   . History of chicken pox   . GERD (gastroesophageal reflux disease)   . Ex-smoker   . Seborrheic dermatitis   . ED (erectile dysfunction)   . Rosacea   . Basal cell carcinoma of nose 1998    History reviewed. No pertinent past surgical history.  There were no vitals filed for this visit.  Visit Diagnosis:  Shoulder stiffness, right  Pain in joint, shoulder region, right  Abnormal posture      Subjective Assessment - 08/10/14 1239    Subjective pt relates he is sore today from pulling weeds the past two days but otherwise doing well.  pt reports he has 4/10 pain currently.    Patient Stated Goals pt would like would be able to work in the yard.    Currently in Pain? Yes   Pain Score 4    Pain Location Shoulder   Pain Orientation Right   Pain Descriptors / Indicators Sore          Manual therapy:  Passive shoulder flexion with over pressure 2x30 sec Passive shoulder abduction with over pressure 2x30 sec Soft tissue mobilization  (effleurage, cross friction) to upper trap and anterior shoulder   There ex: UBEx3 min (no charge) Supine shoulder abduction with wand: 2x10, pt required verbal and tactile cues for correct technique Supine serratus punch 2x10: pt required verbal cueing for slow controlled movement Right bicep curls with 2#, x10 sidelying shoulder external rotation with 1#, 2#, x10 each.   Right bicep curls with red theraband 2x10 Rows with red band 2x10, pt required verbal and tactile cues to decrease shoulder shrug Shoulder extension with red theraband, 2x10, pt required verbal cues for correct technique                                  PT Long Term Goals - 07/27/14 1338    PT LONG TERM GOAL #1   Title pt will increased active shoulder flexion to 140 deg to be able put dishes away.    Time 4   Period Weeks   Status Partially Met   PT LONG TERM GOAL #2   Title pt will be able to improve shoulder IR to WNL to be able to put his belt on without compensation    Baseline IR to L4   Time 4   Period Weeks  Status Partially Met   PT LONG TERM GOAL #3   Title pt will be able to carry 15lb object x 20 feet using RUE to be able to do yard work.    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   Time 4   Period Weeks   Status Partially Met               Plan - 08/10/14 1243    Clinical Impression Statement pt experienced minimal increase in pain with progression of shoulder strength exercises.  pt is making good gains with shoulder flexion and greatest dificit is shoulder abduction ~90 degrees.  pt required re instruction of shoulder abduction with wand due to peforrming it incorrectly. pt  would benefit from continued skilled PT services to improve remaining deficits.    Pt will benefit from skilled therapeutic intervention in order to  improve on the following deficits Pain;Impaired flexibility;Decreased strength;Postural dysfunction;Impaired UE functional use;Decreased range of motion   Rehab Potential Excellent   PT Frequency 2x / week   PT Duration 4 weeks   PT Treatment/Interventions Dry needling;Moist Heat;Therapeutic exercise;Manual techniques;Taping;Cryotherapy;Scar mobilization;Neuromuscular re-education;Patient/family education;Therapeutic activities;Iontophoresis 44m/ml Dexamethasone;Electrical Stimulation   PT Next Visit Plan progress exercises as tolerated        Problem List Patient Active Problem List   Diagnosis Date Noted  . Right anterior shoulder pain 02/23/2014  . Renal insufficiency 02/23/2014  . Ex-smoker 07/11/2013  . COPD (chronic obstructive pulmonary disease)   . Glaucoma   . Hyperlipidemia   . GERD (gastroesophageal reflux disease)   . History of pneumonia 06/03/2013  . 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 I have personally read, edited and approve of the note as written. AGorden Harms Tortorici, PT, DPT #[id number] CCherry GroveMAIN RLongmont United HospitalSERVICES [address] StreetRWebster [place] [address]Phone: [phone]  Fax:  [phone]

## 2014-08-10 NOTE — Patient Instructions (Signed)
[email or web address] sidelying shoulder ER 2x10 with low can Supine shoulder abduction with wand 2x10

## 2014-08-15 ENCOUNTER — Ambulatory Visit: Payer: Medicare Other

## 2014-08-15 DIAGNOSIS — M25511 Pain in right shoulder: Secondary | ICD-10-CM | POA: Diagnosis not present

## 2014-08-15 DIAGNOSIS — M25611 Stiffness of right shoulder, not elsewhere classified: Secondary | ICD-10-CM

## 2014-08-15 DIAGNOSIS — R293 Abnormal posture: Secondary | ICD-10-CM | POA: Diagnosis not present

## 2014-08-15 NOTE — Therapy (Signed)
[place] MAIN [facility] SERVICES [address] [place], [place], [address] Phone: [phone]   Fax:  [phone]  Physical Therapy Treatment  Patient Details  Name: Victor Cortez MRN: [id number] Date of Birth: 04-Mar-1950 Referring Provider:  Ria Bush, MD  Encounter Date: 08/15/2014      PT End of Session - 08/15/14 1346    Visit Number 11   Number of Visits 17   Date for PT Re-Evaluation 08/31/14   Authorization Type 6/10   PT Start Time 0900   PT Stop Time 0945   PT Time Calculation (min) 45 min   Activity Tolerance Patient tolerated treatment well   Behavior During Therapy [facility] for tasks assessed/performed      Past Medical History  Diagnosis Date  . History of pneumonia 06/2013    with sepsis and [facility] hospitalization  . Atrial fibrillation with RVR 06/2013    during hospitalization  . COPD (chronic obstructive pulmonary disease)   . Glaucoma     Dr. Matilde Sprang  . Hyperlipidemia   . History of chicken pox   . GERD (gastroesophageal reflux disease)   . Ex-smoker   . Seborrheic dermatitis   . ED (erectile dysfunction)   . Rosacea   . Basal cell carcinoma of nose 1998    History reviewed. No pertinent past surgical history.  There were no vitals filed for this visit.  Visit Diagnosis:  Shoulder stiffness, right  Pain in joint, shoulder region, right      Subjective Assessment - 08/15/14 1342    Subjective pt reports approximately 3-4 days ago he while weeding he experiened sharp pain in his right shoulder and has been sore since then.  pt relates he currently has 2/10 pain and the sharp pain he experiences is intermittently.    Patient Stated Goals pt would like would be able to work in the yard.    Pain Score 2    Pain Location Shoulder   Pain Orientation Right   Pain Descriptors / Indicators Sharp          There ex: UBEx4 min (no charge) Supine serratus punch with 2# x10, with 3# x 10: pt required verbal  cueing for slow controlled movement Supine D2 shoulder flexion active assisted x 10 for proper form and pace, x10 actively.  Pt required tactile cues for proper diagonal movement.  Wall slides with lift x10, pt required verbal and tactile cues to decrease shoulder shrug.  Standing shoulder AAROM with wand x10 Right bicep curls with 2#, x10 Right bicep curls with red theraband 2x10 Shoulder extension with red band 2x10 Serratus punch with red band x10 Standing shoulder external rotation with yellow band x10 Rows with red band 2x10, pt required verbal and tactile cues to decrease shoulder shrug Shoulder extension with red theraband, 2x10, pt required verbal cues for correct technique Standing D2 shoulder flexion, pt experiences sharp pain approximately 90 degrees and demonstrates right shoulder shrug x5                               PT Education - 08/15/14 1346    Education provided Yes   Education Details progress HEP with red band   Person(s) Educated Patient   Methods Explanation   Comprehension Verbalized understanding             PT Long Term Goals - 07/27/14 1338    PT LONG TERM GOAL #1  Title pt will increased active shoulder flexion to 140 deg to be able put dishes away.    Time 4   Period Weeks   Status Partially Met   PT LONG TERM GOAL #2   Title pt will be able to improve shoulder IR to WNL to be able to put his belt on without compensation    Baseline IR to L4   Time 4   Period Weeks   Status Partially Met   PT LONG TERM GOAL #3   Title pt will be able to carry 15lb object x 20 feet using RUE to be able to do yard work.    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   Time 4   Period Weeks   Status Partially Met               Plan - 08/15/14 1347    Clinical Impression Statement pt is  progressing well with PT and is now able to actively abduct his shoulder to 120 degrees versus 90 degrees from last week.  pt experiened no to minimal increase in pain with increased resistance (red band) with movements involving shoulder extension.  pt demonstrates improved mechanics with movments involving shoulder flexion but still requires min-mod verbal and tactile cues to decrease dysfunctional movement.  pt would benefit from skilled PT to improve remaining deficits.    Pt will benefit from skilled therapeutic intervention in order to improve on the following deficits Pain;Impaired flexibility;Decreased strength;Postural dysfunction;Impaired UE functional use;Decreased range of motion   Rehab Potential Excellent   PT Frequency 2x / week   PT Duration 4 weeks   PT Treatment/Interventions Dry needling;Moist Heat;Therapeutic exercise;Manual techniques;Taping;Cryotherapy;Scar mobilization;Neuromuscular re-education;Patient/family education;Therapeutic activities;Iontophoresis 50m/ml Dexamethasone;Electrical Stimulation   PT Next Visit Plan progress exercises as tolerated        Problem List Patient Active Problem List   Diagnosis Date Noted  . Right anterior shoulder pain 02/23/2014  . Renal insufficiency 02/23/2014  . Ex-smoker 07/11/2013  . COPD (chronic obstructive pulmonary disease)   . Glaucoma   . Hyperlipidemia   . GERD (gastroesophageal reflux disease)   . History of pneumonia 06/03/2013  . 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 I have personally read, edited and approve of the note as written. AGorden Harms Tortorici, PT, DPT #[id number] Tortorici,Ashley 08/15/2014, 2:45 PM  CLoganvilleMAIN REn[facility] Sar[facility]otaSERVICES [address] [address]Phone: [phone]  Fax:  [phone]

## 2014-08-16 ENCOUNTER — Ambulatory Visit: Payer: Medicare Other

## 2014-08-16 DIAGNOSIS — M25511 Pain in right shoulder: Secondary | ICD-10-CM

## 2014-08-16 DIAGNOSIS — M25611 Stiffness of right shoulder, not elsewhere classified: Secondary | ICD-10-CM | POA: Diagnosis not present

## 2014-08-16 DIAGNOSIS — R293 Abnormal posture: Secondary | ICD-10-CM | POA: Diagnosis not present

## 2014-08-16 NOTE — Therapy (Signed)
[place] MAIN [facility] SERVICES [address] [place], [place], [address] Phone: [phone]   Fax:  [phone]  Physical Therapy Treatment  Patient Details  Name: Victor Cortez MRN: [id number] Date of Birth: 04-14-1950 Referring Provider:  Ria Bush, MD  Encounter Date: 08/16/2014      PT End of Session - 08/16/14 1126    Visit Number 12   Number of Visits 17   Date for PT Re-Evaluation 08/31/14   Authorization Type 7/10   PT Start Time 0900   PT Stop Time 0945   PT Time Calculation (min) 45 min   Activity Tolerance Patient tolerated treatment well   Behavior During Therapy [facility] for tasks assessed/performed      Past Medical History  Diagnosis Date  . History of pneumonia 06/2013    with sepsis and [facility] hospitalization  . Atrial fibrillation with RVR 06/2013    during hospitalization  . COPD (chronic obstructive pulmonary disease)   . Glaucoma     Dr. Matilde Sprang  . Hyperlipidemia   . History of chicken pox   . GERD (gastroesophageal reflux disease)   . Ex-smoker   . Seborrheic dermatitis   . ED (erectile dysfunction)   . Rosacea   . Basal cell carcinoma of nose 1998    History reviewed. No pertinent past surgical history.  There were no vitals filed for this visit.  Visit Diagnosis:  Shoulder stiffness, right  Pain in joint, shoulder region, right      Subjective Assessment - 08/16/14 1123    Subjective pt reports pain of 3/10 in his right shoulder and tightness/soreness in his upper right trap.    Patient Stated Goals pt would like would be able to work in the yard.    Currently in Pain? Yes   Pain Score 3    Pain Location Shoulder   Pain Orientation Right        There ex: UBEx4 min (no charge) Supine serratus punch with 3# 2x 10: pt required verbal cueing for slow controlled movement Supine D2 shoulder flexion with resistance from PT throughout the motion 3x 10, Pt required tactile cues for proper diagonal  movement.  Wall slides with lift x10, pt required verbal and tactile cues to decrease shoulder shrug.  Standing shoulder AAROM with wand x10 Scapular retractions, pt required verbal and tactile cues to engage lower trap Scapular retractions with red band 2x10 and pt required verbal cues to decrease upper trap activation Wall push-ups 2x10 Rows with red band 2x10, pt required verbal and tactile cues to decrease shoulder shrug Shoulder extension with red theraband, 2x10, pt required verbal cues for correct technique     manual therapy: no charge (54mn) Patient received dry needling therapy education and acknowledged understanding of risks and benefits of dry needling therapy prior to receiving treatment. Patient voiced understanding of treatment options and elected to proceed with dry needling therapy.  ACaryl PinaTortorici PT, DPT performed Trigger point dry needling to the R upper trap in prone. No LTR elicited, but pt did report normal deep ache. This was followed by R upper trap stretching 30s x 2 with min cues.                         PT Education - 08/16/14 1125    Education provided Yes   Education Details on imroving lower trap strength to help decrease upper trap activation during R UE movements to decresae upper  trap pain    Person(s) Educated Patient   Methods Explanation   Comprehension Verbalized understanding             PT Long Term Goals - 07/27/14 1338    PT LONG TERM GOAL #1   Title pt will increased active shoulder flexion to 140 deg to be able put dishes away.    Time 4   Period Weeks   Status Partially Met   PT LONG TERM GOAL #2   Title pt will be able to improve shoulder IR to WNL to be able to put his belt on without compensation    Baseline IR to L4   Time 4   Period Weeks   Status Partially Met   PT LONG TERM GOAL #3   Title pt will be able to carry 15lb object x 20 feet using RUE to be able to do yard work.    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   Time 4   Period Weeks   Status Partially Met               Plan - 08/16/14 1127    Clinical Impression Statement session focused improving lower trap activation/strength due to pt demonstrating shrugging with most right shoulder flexion movements.  pt demonstrates improved mechanics with repetitions and intructed pt to continue ther ex at home to maintain and improve gains from session.  pt would benefit from skilled PT services to improve remaining defictis    Pt will benefit from skilled therapeutic intervention in order to improve on the following deficits Pain;Impaired flexibility;Decreased strength;Postural dysfunction;Impaired UE functional use;Decreased range of motion   Rehab Potential Excellent   PT Frequency 2x / week   PT Duration 4 weeks   PT Treatment/Interventions Dry needling;Moist Heat;Therapeutic exercise;Manual techniques;Taping;Cryotherapy;Scar mobilization;Neuromuscular re-education;Patient/family education;Therapeutic activities;Iontophoresis 43m/ml Dexamethasone;Electrical Stimulation   PT Next Visit Plan progress exercises as tolerated        Problem List Patient Active Problem List   Diagnosis Date Noted  . Right anterior shoulder pain 02/23/2014  . Renal insufficiency 02/23/2014  . Ex-smoker 07/11/2013  . COPD (chronic obstructive pulmonary disease)   . Glaucoma   . Hyperlipidemia   . GERD (gastroesophageal reflux disease)   . History of pneumonia 06/03/2013  . 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 I have personally read, edited and approve of the note as written. AGorden Harms Tortorici, PT, DPT #[id number] [phone] CLakelineMAIN RWenatchee [facility] D[facility] Con[facility] Om[facility] AscSERVICES [address] CourtRL[address] [place] [address]Phone: [phone]  Fax:  [phone]

## 2014-08-22 ENCOUNTER — Ambulatory Visit: Payer: Medicare Other

## 2014-08-22 DIAGNOSIS — M25611 Stiffness of right shoulder, not elsewhere classified: Secondary | ICD-10-CM

## 2014-08-22 DIAGNOSIS — R293 Abnormal posture: Secondary | ICD-10-CM | POA: Diagnosis not present

## 2014-08-22 DIAGNOSIS — M25511 Pain in right shoulder: Secondary | ICD-10-CM | POA: Diagnosis not present

## 2014-08-22 NOTE — Therapy (Signed)
[place] MAIN [facility] SERVICES [address] [place], [place], [address] Phone: [phone]   Fax:  [phone]  Physical Therapy Treatment  Patient Details  Name: Victor Cortez MRN: [id number] Date of Birth: [birth year] Referring Provider:  Ria Bush, MD  Encounter Date: 08/22/2014      PT End of Session - 08/22/14 1156    Visit Number 13   Number of Visits 17   Date for PT Re-Evaluation 08/31/14   Authorization Type 8/10   PT Start Time 0900   PT Stop Time 0945   PT Time Calculation (min) 45 min   Activity Tolerance Patient tolerated treatment well   Behavior During Therapy [facility] for tasks assessed/performed      Past Medical History  Diagnosis Date  . History of pneumonia 06/2013    with sepsis and [facility] hospitalization  . Atrial fibrillation with RVR 06/2013    during hospitalization  . COPD (chronic obstructive pulmonary disease)   . Glaucoma     Dr. Matilde Sprang  . Hyperlipidemia   . History of chicken pox   . GERD (gastroesophageal reflux disease)   . Ex-smoker   . Seborrheic dermatitis   . ED (erectile dysfunction)   . Rosacea   . Basal cell carcinoma of nose 1998    History reviewed. No pertinent past surgical history.  There were no vitals filed for this visit.  Visit Diagnosis:  Shoulder stiffness, right  Pain in joint, shoulder region, right      Subjective Assessment - 08/22/14 1047    Subjective pt relates he is doing well and that his upper right trap felt much better after dry needling from last session.    Patient Stated Goals pt would like would be able to work in the yard.    Currently in Pain? No/denies   Pain Score 0-No pain          There ex: UBEx4 min (no charge) Wall slides with lift x10, pt required verbal and tactile cues to decrease shoulder shrug.  Standing shoulder AAROM with wand x10 Wall push-ups 2x10 Rows with red band 2x10, pt required verbal and tactile cues to decrease shoulder  shrug Shoulder extension with red theraband, 2x10, pt required verbal cues for correct technique  thera activity: 2# on right UE and putting items in cabinet in all three shelves x2  3# on right UE and putting items in cabinet in all three shelves x2 Lifting 25# and placing on the table Walking 20 ft x4 with 7# weight in both UE  Walking with 55f x 4 with 15# in both UE  Pt required verbal cueing for correct lifting mechanics, decreasing shoulder shrug                         PT Education - 08/22/14 1048    Education provided Yes   Education Details to resume shoulder HEP versus only performing bicep curls and pulleys    Person(s) Educated Patient   Methods Explanation   Comprehension Verbalized understanding             PT Long Term Goals - 07/27/14 1338    PT LONG TERM GOAL #1   Title pt will increased active shoulder flexion to 140 deg to be able put dishes away.    Time 4   Period Weeks   Status Partially Met   PT LONG TERM GOAL #2   Title pt will be able  to improve shoulder IR to WNL to be able to put his belt on without compensation    Baseline IR to L4   Time 4   Period Weeks   Status Partially Met   PT LONG TERM GOAL #3   Title pt will be able to carry 15lb object x 20 feet using RUE to be able to do yard work.    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   Time 4   Period Weeks   Status Partially Met               Plan - 08/22/14 1156    Clinical Impression Statement pt presents with decreased pain and is able to complete session with minimal increase in pain.  pt is able to perform functional activities with minimal shoulder hiking.  pt lacks full active shoulder ROM and  and strength and would benefit from skilled PT services to improve remaining deficits.    Pt will benefit from skilled  therapeutic intervention in order to improve on the following deficits Pain;Impaired flexibility;Decreased strength;Postural dysfunction;Impaired UE functional use;Decreased range of motion   Rehab Potential Excellent   PT Frequency 2x / week   PT Duration 4 weeks   PT Treatment/Interventions Dry needling;Moist Heat;Therapeutic exercise;Manual techniques;Taping;Cryotherapy;Scar mobilization;Neuromuscular re-education;Patient/family education;Therapeutic activities;Iontophoresis 70m/ml Dexamethasone;Electrical Stimulation   PT Next Visit Plan review HEP         Problem List Patient Active Problem List   Diagnosis Date Noted  . Right anterior shoulder pain 02/23/2014  . Renal insufficiency 02/23/2014  . Ex-smoker 07/11/2013  . COPD (chronic obstructive pulmonary disease)   . Glaucoma   . Hyperlipidemia   . GERD (gastroesophageal reflux disease)   . History of pneumonia 06/03/2013  . 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 I have personally read, edited and approve of the note as written. AGorden Harms Tortorici, PT, DPT #[id number] CDunn CenterMAIN RHarborview Medical CenterSERVICES [address] [address]Phone: [phone]  Fax:  [phone]

## 2014-08-24 ENCOUNTER — Ambulatory Visit: Payer: Medicare Other

## 2014-08-24 DIAGNOSIS — M25611 Stiffness of right shoulder, not elsewhere classified: Secondary | ICD-10-CM

## 2014-08-24 DIAGNOSIS — R293 Abnormal posture: Secondary | ICD-10-CM | POA: Diagnosis not present

## 2014-08-24 DIAGNOSIS — M25511 Pain in right shoulder: Secondary | ICD-10-CM

## 2014-08-24 NOTE — Therapy (Signed)
[place] MAIN [facility] SERVICES [address] [place], [place], [address] Phone: [phone]   Fax:  [phone]  Physical Therapy Treatment/Discharge Summary  Patient Details  Name: Victor Cortez MRN: [id number] Date of Birth: 29-Jun-1950 Referring Provider:  Garald Balding, MD  Encounter Date: 08/24/2014      PT End of Session - 08/24/14 1100    Visit Number 14   Number of Visits 17   Date for PT Re-Evaluation 08/31/14   Authorization Type 8/10   PT Start Time 0902   PT Stop Time 0945   PT Time Calculation (min) 43 min   Activity Tolerance Patient tolerated treatment well   Behavior During Therapy [facility] for tasks assessed/performed      Past Medical History  Diagnosis Date  . History of pneumonia 06/2013    with sepsis and [facility] hospitalization  . Atrial fibrillation with RVR 06/2013    during hospitalization  . COPD (chronic obstructive pulmonary disease)   . Glaucoma     Dr. Matilde Sprang  . Hyperlipidemia   . History of chicken pox   . GERD (gastroesophageal reflux disease)   . Ex-smoker   . Seborrheic dermatitis   . ED (erectile dysfunction)   . Rosacea   . Basal cell carcinoma of nose 1998    History reviewed. No pertinent past surgical history.  There were no vitals filed for this visit.  Visit Diagnosis:  Shoulder stiffness, right  Pain in joint, shoulder region, right      Subjective Assessment - 08/24/14 0952    Subjective pt relates he is doing well and his right shoulder is sore from yard work.  pt is looking forward to "graduating" today   Patient Stated Goals pt would like would be able to work in the yard.    Pain Score 1    Pain Location Shoulder   Pain Orientation Right       There ex: UBEx4 min (no charge) Wall slides with lift x10, Wall push-ups 1x10 Rows with red band 1x10, Shoulder extension with red theraband, 1x10 Serratus punches with 1# x10 Scapular retractions x10 Bicep curls with red band  x10 External shoulder rotation with yellow band d2 shoulder flexion AROM x10 Pt required min verbal cueing for proper exercise technique and from shrugging shoulders   quick dash: 22.72%                       PT Education - 08/24/14 0954    Education provided Yes   Education Details reviewed HEP a   Person(s) Educated Patient   Methods Explanation;Demonstration   Comprehension Verbalized understanding;Returned demonstration             PT Long Term Goals - 08/24/14 1104    PT LONG TERM GOAL #1   Title pt will increased active shoulder flexion to 140 deg to be able put dishes away.    Baseline 140 degrees of active shoulder flexion    Time 4   Period Weeks   Status Achieved   PT LONG TERM GOAL #2   Title pt will be able to improve shoulder IR to WNL to be able to put his belt on without compensation    Baseline to L1 with minimal compensation    Time 4   Period Weeks   Status Achieved   PT LONG TERM GOAL #3   Title pt will be able to carry 15lb object x 20 feet using  RUE to be able to do yard work.    Baseline pt is able to cary 15# 2x20 feet using R UE   Time 4   Period Weeks   Status Achieved   PT LONG TERM GOAL #4   Title pt will be able to improve functional shoulder flexion and ER to be able to wash hair using both UEs.   Baseline pt is able to was his her using UEs   Time 4   Period Weeks   Status Achieved               Plan - Aug 30, 2014 1101    Clinical Impression Statement pt presents with improved right shoulder AROM (140 degrees flexion, 136 degrees of abduction, 40 degrees of ER).  pt is able to perform therex/HEP with none to minimal verbal cueing.  at this time patient has met all his goals and has been discharged with HEP to [facility] further gains.      Pt will benefit from skilled therapeutic intervention in order to improve on the following deficits Pain;Impaired flexibility;Decreased strength;Postural  dysfunction;Impaired UE functional use;Decreased range of motion   Rehab Potential Excellent   PT Frequency 2x / week   PT Duration 4 weeks   PT Treatment/Interventions Dry needling;Moist Heat;Therapeutic exercise;Manual techniques;Taping;Cryotherapy;Scar mobilization;Neuromuscular re-education;Patient/family education;Therapeutic activities;Iontophoresis 3m/ml Dexamethasone;Electrical Stimulation   PT Next Visit Plan --          G-Codes - 0July 27, [id number]    Functional Assessment Tool Used clinical judment, quick dash, ROM   Functional Limitation Carrying, moving and handling objects   Carrying, Moving and Handling Objects Current Status (([id number] At least 1 percent but less than 20 percent impaired, limited or restricted   Carrying, Moving and Handling Objects Goal Status (([id number] At least 1 percent but less than 20 percent impaired, limited or restricted   Carrying, Moving and Handling Objects Discharge Status [phone] At least 1 percent but less than 20 percent impaired, limited or restricted      Problem List Patient Active Problem List   Diagnosis Date Noted  . Right anterior shoulder pain 02/23/2014  . Renal insufficiency 02/23/2014  . Ex-smoker 07/11/2013  . COPD (chronic obstructive pulmonary disease)   . Glaucoma   . Hyperlipidemia   . GERD (gastroesophageal reflux disease)   . History of pneumonia 06/03/2013  . 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 I have personally read, edited and approve of the note as written.  AGorden Harms Tortorici, PT, DPT #[id number] CCarterMAIN RCulberson HospitalSERVICES [address] [address]Phone: [phone]  Fax:  [phone]

## 2014-08-24 NOTE — Patient Instructions (Signed)
[email or web address] Wall slides with lift 2x10 Wall push ups 2x10 Rows with red band 2x10 Shoulder extension with red band 2x10 Serratus punch with weight 2x10 Scapular retractions 2x10 Bicep curls with red band 2x10 External shoulder rotation with yellow band 2x10 D2 shoulder flexion x10

## 2014-08-31 DIAGNOSIS — I340 Nonrheumatic mitral (valve) insufficiency: Secondary | ICD-10-CM | POA: Diagnosis not present

## 2014-08-31 DIAGNOSIS — I4891 Unspecified atrial fibrillation: Secondary | ICD-10-CM | POA: Diagnosis not present

## 2014-08-31 DIAGNOSIS — I10 Essential (primary) hypertension: Secondary | ICD-10-CM | POA: Diagnosis not present

## 2014-09-10 ENCOUNTER — Other Ambulatory Visit: Payer: Self-pay | Admitting: Family Medicine

## 2014-09-14 DIAGNOSIS — H2513 Age-related nuclear cataract, bilateral: Secondary | ICD-10-CM | POA: Diagnosis not present

## 2014-09-14 DIAGNOSIS — H4011X1 Primary open-angle glaucoma, mild stage: Secondary | ICD-10-CM | POA: Diagnosis not present

## 2014-09-26 DIAGNOSIS — H4011X1 Primary open-angle glaucoma, mild stage: Secondary | ICD-10-CM | POA: Diagnosis not present

## 2014-09-26 DIAGNOSIS — H2513 Age-related nuclear cataract, bilateral: Secondary | ICD-10-CM | POA: Diagnosis not present

## 2014-10-26 DIAGNOSIS — Z23 Encounter for immunization: Secondary | ICD-10-CM | POA: Diagnosis not present

## 2014-11-21 ENCOUNTER — Encounter: Payer: Self-pay | Admitting: Family Medicine

## 2014-11-21 ENCOUNTER — Ambulatory Visit (INDEPENDENT_AMBULATORY_CARE_PROVIDER_SITE_OTHER): Payer: Medicare Other | Admitting: Family Medicine

## 2014-11-21 VITALS — BP 140/88 | HR 64 | Temp 97.4°F | Wt 160.2 lb

## 2014-11-21 DIAGNOSIS — B353 Tinea pedis: Secondary | ICD-10-CM

## 2014-11-21 DIAGNOSIS — N433 Hydrocele, unspecified: Secondary | ICD-10-CM | POA: Insufficient documentation

## 2014-11-21 DIAGNOSIS — R21 Rash and other nonspecific skin eruption: Secondary | ICD-10-CM | POA: Insufficient documentation

## 2014-11-21 DIAGNOSIS — H2513 Age-related nuclear cataract, bilateral: Secondary | ICD-10-CM | POA: Diagnosis not present

## 2014-11-21 DIAGNOSIS — H401121 Primary open-angle glaucoma, left eye, mild stage: Secondary | ICD-10-CM | POA: Diagnosis not present

## 2014-11-21 DIAGNOSIS — N5089 Other specified disorders of the male genital organs: Secondary | ICD-10-CM

## 2014-11-21 DIAGNOSIS — H401111 Primary open-angle glaucoma, right eye, mild stage: Secondary | ICD-10-CM | POA: Diagnosis not present

## 2014-11-21 DIAGNOSIS — N509 Disorder of male genital organs, unspecified: Secondary | ICD-10-CM

## 2014-11-21 MED ORDER — TERBINAFINE HCL POWD: Status: DC

## 2014-11-21 NOTE — Patient Instructions (Addendum)
Try daily or twice daily lotrimin (clotrimazole) cream to foot. Use antifungal powder in shoes. Let's check scrotal ultrasound to distinguish between hydrocele and hernia.  Pass by referral coordinator.

## 2014-11-21 NOTE — Progress Notes (Signed)
BP 140/88 mmHg  Pulse 64  Temp(Src) 97.4 F (36.3 C) (Oral)  Wt 160 lb 4 oz (72.689 kg)   CC: check inguinal hernia  Subjective:    Patient ID: Victor Cortez, male    DOB: [birth year], 64 y.o.   MRN: [id number]  HPI: Victor Cortez is a 64 y.o. male presenting on 11/21/2014 for Inguinal Hernia   "I've had hernia forever". Swelling at right scrotum present for years, at times enlarges and other times normal. Never really painful.  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  Also would like rash on feet evaluated - itchy, tender. Saw derm remotely - treated with cream that started with K. No improvement.  Relevant past medical, surgical, family and social history reviewed and updated as indicated. Interim medical history since our last visit reviewed. Allergies and medications reviewed and updated. Current Outpatient Prescriptions on File Prior to Visit  Medication Sig  . aspirin EC 81 MG tablet Take by mouth. 1 tab by mouth in AM  . COMBIVENT RESPIMAT 20-100 MCG/ACT AERS respimat INHALE 1 PUFF INTO THE LUNGS EVERY 6 (SIX) HOURS AS NEEDED FOR WHEEZING.  . Cyanocobalamin (RA VITAMIN B-12 TR) 1000 MCG TBCR Take by mouth. One tab by mouth daily  . fluticasone (FLOVENT HFA) 110 MCG/ACT inhaler Inhale 2 puffs into the lungs 2 (two) times daily.  . Glucosamine-Chondroit-Vit C-Mn (GLUCOSAMINE 1500 COMPLEX) CAPS Take 1 capsule by mouth daily.  Marland Kitchen latanoprost (XALATAN) 0.005 % ophthalmic solution One drop in both eyes at bedtime  . Multiple Vitamin (MULTI-VITAMINS) TABS Take by mouth. 1 tab by mouth daily  . Omega-3 Fatty Acids (FISH OIL) 1000 MG CAPS Take by mouth. 1 cap by mouth daily  . pantoprazole (PROTONIX) 40 MG tablet Take 1 tablet (40 mg total) by mouth daily.  . sotalol (BETAPACE) 80 MG tablet Take by mouth. One tab by mouth twice a day  . metroNIDAZOLE (METROCREAM) 0.75 % cream Apply 1 application topically 2 (two) times daily  as needed.   No current facility-administered medications on file prior to visit.    Review of Systems Per HPI unless specifically indicated above     Objective:    BP 140/88 mmHg  Pulse 64  Temp(Src) 97.4 F (36.3 C) (Oral)  Wt 160 lb 4 oz (72.689 kg)  Wt Readings from Last 3 Encounters:  11/21/14 160 lb 4 oz (72.689 kg)  06/06/14 166 lb (75.297 kg)  02/28/14 166 lb (75.297 kg)    Physical Exam  Constitutional: He appears well-developed and well-nourished. No distress.  Genitourinary: Penis normal. Right testis shows mass and swelling (++). Right testis shows no tenderness. Right testis is descended. Left testis shows no mass, no swelling and no tenderness. Left testis is descended.  Large cystic mass right scrotum encompassing entire scrotum, not reducible, not tender to palpation  Skin: Rash noted.  Scaling erythematous rash with hyperpigmented macules right sole, some scabbed abrasion  Nursing note and vitals reviewed.     Assessment & Plan:  Due for AMW. Problem List Items Addressed This Visit    Scrotal mass - Primary    Anticipate large hydrocele not inguinal hernia as markedly enlarged and not tender, not reducible. Check scrotal US and then refer to surg vs uro. Pt agrees with plan, interested in discussion of options.      Dermatophytosis of foot    Anticipate tinea pedis - discussed. Start daily clotrimazole  cream, terbinafine powder in all shoes. Update if not improved with treatment to consider oral therapy.          Follow up plan: Return as needed, for follow up visit.

## 2014-11-21 NOTE — Assessment & Plan Note (Addendum)
Anticipate large hydrocele not inguinal hernia as markedly enlarged and not tender, not reducible. Check scrotal US and then refer to surg vs uro. Pt agrees with plan, interested in discussion of options.

## 2014-11-21 NOTE — Assessment & Plan Note (Addendum)
Anticipate tinea pedis - discussed. Start daily clotrimazole cream, terbinafine powder in all shoes. Update if not improved with treatment to consider oral therapy.

## 2014-11-21 NOTE — Progress Notes (Signed)
Pre visit review using our clinic review tool, if applicable. No additional management support is needed unless otherwise documented below in the visit note. 

## 2014-11-23 ENCOUNTER — Ambulatory Visit
Admission: RE | Admit: 2014-11-23 | Discharge: 2014-11-23 | Disposition: A | Discharge status: Home / Self Care | Payer: Medicare Other | Source: Ambulatory Visit | Attending: Family Medicine | Admitting: Family Medicine

## 2014-11-23 ENCOUNTER — Other Ambulatory Visit: Payer: Self-pay | Admitting: Family Medicine

## 2014-11-23 DIAGNOSIS — N509 Disorder of male genital organs, unspecified: Secondary | ICD-10-CM | POA: Insufficient documentation

## 2014-11-23 DIAGNOSIS — N5089 Other specified disorders of the male genital organs: Secondary | ICD-10-CM

## 2014-11-23 DIAGNOSIS — N50811 Right testicular pain: Secondary | ICD-10-CM | POA: Diagnosis not present

## 2014-12-05 ENCOUNTER — Encounter: Payer: Self-pay | Admitting: Urology

## 2014-12-05 ENCOUNTER — Ambulatory Visit (INDEPENDENT_AMBULATORY_CARE_PROVIDER_SITE_OTHER): Payer: Medicare Other | Admitting: Urology

## 2014-12-05 VITALS — BP 169/84 | HR 61 | Ht 66.0 in | Wt 156.9 lb

## 2014-12-05 DIAGNOSIS — N433 Hydrocele, unspecified: Secondary | ICD-10-CM | POA: Diagnosis not present

## 2014-12-05 DIAGNOSIS — N5203 Combined arterial insufficiency and corporo-venous occlusive erectile dysfunction: Secondary | ICD-10-CM | POA: Diagnosis not present

## 2014-12-05 NOTE — Progress Notes (Signed)
H&P  Chief Complaint: Hydrocele, erectile dysfunction  History of Present Illness:  Consult for hydrocele, patient also complained of erectile dysfunction. Refer by Dr. Danise Mina.  1-right hydrocele-patient has noted right scrotal swelling for many years. He is not bothered. 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 He underwent scrotal ultrasound which showed normal testicles and a 13 cm right hydrocele. I reviewed all the images. Again he is asymptomatic.  2-erectile dysfunction-for several years patient has had trouble getting and maintaining an erection. He does get a.m. erections and has a good libido. He tried Viagra in the past with adequate results. He has no chest pain or nitroglycerin use. Will   Past Medical History  Diagnosis Date  . History of pneumonia 06/2013    with sepsis and [facility] hospitalization  . Atrial fibrillation with RVR ([place]) 06/2013    during hospitalization  . COPD (chronic obstructive pulmonary disease) ([place])   . Glaucoma     Dr. Matilde Sprang  . Hyperlipidemia   . History of chicken pox   . GERD (gastroesophageal reflux disease)   . Ex-smoker   . Seborrheic dermatitis   . ED (erectile dysfunction)   . Rosacea   . Basal cell carcinoma of nose 1998  . Arthritis    Past Surgical History  Procedure Laterality Date  . Rotator cuff repair  2016    Home Medications:   (Not in a hospital admission) Allergies: No Known Allergies  Family History  Problem Relation Age of Onset  . COPD Father   . COPD Mother   . Cancer Father     lung (smoker)  . CAD Neg Hx   . Stroke Neg Hx   . Bladder Cancer Neg Hx   . Prostate cancer Neg Hx    Social History:  reports that he quit smoking about 18 months ago. His smoking use included Cigarettes. He has a 106 pack-year smoking history. He has never used smokeless tobacco. He reports that he does not drink alcohol or use illicit drugs.  ROS: A complete review of systems was performed.  All systems are  negative except for pertinent findings as noted. Positive for heartburn, cough, shortness of breath ROS   Physical Exam:  Vital signs in last 24 hours: @VSRANGES @ General:  Alert and oriented, No acute distress HEENT: Normocephalic, atraumatic Neck: No JVD or lymphadenopathy Cardiovascular: Regular rate and rhythm Lungs: Regular rate and effort Abdomen: Soft, nontender, nondistended, no abdominal masses Back: No CVA tenderness Extremities: No edema Neurologic: Grossly intact GU: Penis circumcised without mass or lesion, penis somewhat buried by the way to the hydrocele pulling the skin down. Large right cystic mass in right hemiscrotum which is nontender on exam. I do believe I can get on top of it and it hinges. I do not appreciate any inguinal hernias. The left testicle is palpably normal. On digital rectal exam the prostate was smooth without any hard areas or nodules. He had some mild BPH.  Laboratory Data:  No results found for this or any previous visit (from the past 24 hour(s)). No results found for this or any previous visit (from the past 240 hour(s)). Creatinine: No results for input(s): CREATININE in the last 168 hours.  Impression/Assessment/plan: Hydrocele-discussed nature risks benefits of surveillance, aspiration, surgical excision. All questions answered. Again, patient is not bothered and would like to continue surveillance. He does not desire intervention.  Erectile dysfunction-I gave patient some samples of Cialis 5 mg with written instructions not to take  daily but to take 2 tablets, 10 mg or 4 tablets, 20 mg as needed an hour or 2 before sex. He expressed understanding.  We can see him back as needed.   Cc: Dr. Renelda Mom, Alexandros Ewan 12/05/2014, 3:06 PM

## 2015-02-01 ENCOUNTER — Encounter: Payer: Self-pay | Admitting: *Deleted

## 2015-02-09 ENCOUNTER — Telehealth: Payer: Self-pay | Admitting: *Deleted

## 2015-02-09 NOTE — Telephone Encounter (Signed)
PA for Combivent in your IN box for completion.

## 2015-02-10 ENCOUNTER — Other Ambulatory Visit: Payer: Self-pay | Admitting: Family Medicine

## 2015-02-12 NOTE — Telephone Encounter (Signed)
Filled and in Kim's box. 

## 2015-02-13 NOTE — Telephone Encounter (Signed)
PA faxed. Will await determination. 

## 2015-02-20 DIAGNOSIS — H2513 Age-related nuclear cataract, bilateral: Secondary | ICD-10-CM | POA: Diagnosis not present

## 2015-02-20 DIAGNOSIS — H401121 Primary open-angle glaucoma, left eye, mild stage: Secondary | ICD-10-CM | POA: Diagnosis not present

## 2015-02-20 DIAGNOSIS — H401111 Primary open-angle glaucoma, right eye, mild stage: Secondary | ICD-10-CM | POA: Diagnosis not present

## 2015-03-01 DIAGNOSIS — I4891 Unspecified atrial fibrillation: Secondary | ICD-10-CM | POA: Diagnosis not present

## 2015-03-01 DIAGNOSIS — I10 Essential (primary) hypertension: Secondary | ICD-10-CM | POA: Diagnosis not present

## 2015-03-01 DIAGNOSIS — I480 Paroxysmal atrial fibrillation: Secondary | ICD-10-CM | POA: Diagnosis not present

## 2015-03-01 DIAGNOSIS — R0602 Shortness of breath: Secondary | ICD-10-CM | POA: Diagnosis not present

## 2015-03-01 DIAGNOSIS — R079 Chest pain, unspecified: Secondary | ICD-10-CM | POA: Diagnosis not present

## 2015-03-06 DIAGNOSIS — R0602 Shortness of breath: Secondary | ICD-10-CM | POA: Diagnosis not present

## 2015-03-06 DIAGNOSIS — I4891 Unspecified atrial fibrillation: Secondary | ICD-10-CM | POA: Diagnosis not present

## 2015-03-06 DIAGNOSIS — R079 Chest pain, unspecified: Secondary | ICD-10-CM | POA: Diagnosis not present

## 2015-03-06 DIAGNOSIS — Z01810 Encounter for preprocedural cardiovascular examination: Secondary | ICD-10-CM | POA: Diagnosis not present

## 2015-03-06 DIAGNOSIS — I10 Essential (primary) hypertension: Secondary | ICD-10-CM | POA: Diagnosis not present

## 2015-03-08 DIAGNOSIS — R079 Chest pain, unspecified: Secondary | ICD-10-CM | POA: Diagnosis not present

## 2015-03-08 DIAGNOSIS — Z01810 Encounter for preprocedural cardiovascular examination: Secondary | ICD-10-CM | POA: Diagnosis not present

## 2015-03-08 DIAGNOSIS — R0602 Shortness of breath: Secondary | ICD-10-CM | POA: Diagnosis not present

## 2015-03-08 DIAGNOSIS — I4891 Unspecified atrial fibrillation: Secondary | ICD-10-CM | POA: Diagnosis not present

## 2015-03-08 DIAGNOSIS — I10 Essential (primary) hypertension: Secondary | ICD-10-CM | POA: Diagnosis not present

## 2015-03-08 DIAGNOSIS — Z23 Encounter for immunization: Secondary | ICD-10-CM | POA: Diagnosis not present

## 2015-03-08 NOTE — Telephone Encounter (Signed)
Victor Cortez left v/m that [facility] did not receive tier exception from 02/07/15. [employer] is faxing another tier exception request today. Victor Cortez request cb.

## 2015-03-12 ENCOUNTER — Telehealth: Payer: Self-pay | Admitting: Family Medicine

## 2015-03-13 DIAGNOSIS — R9430 Abnormal result of cardiovascular function study, unspecified: Secondary | ICD-10-CM | POA: Diagnosis not present

## 2015-03-14 NOTE — Telephone Encounter (Signed)
Please call patient back about tier exception.  Patient can be reached at [phone] or cell phone [phone]. He'd like a call back before he goes to work at lunch time.

## 2015-03-14 NOTE — Telephone Encounter (Signed)
Rita with [facility] [employer] left v/m that tier exception for combivent was denied. Letter of denial and appeals process to follow.

## 2015-03-14 NOTE — Telephone Encounter (Signed)
Spoke with patient. Advised I had submitted tier exception x2 and that I resubmitted it again this AM. Advised to call me back if he has anymore issues.

## 2015-03-20 DIAGNOSIS — I4891 Unspecified atrial fibrillation: Secondary | ICD-10-CM | POA: Diagnosis not present

## 2015-03-20 DIAGNOSIS — I10 Essential (primary) hypertension: Secondary | ICD-10-CM | POA: Diagnosis not present

## 2015-03-20 DIAGNOSIS — I2510 Atherosclerotic heart disease of native coronary artery without angina pectoris: Secondary | ICD-10-CM | POA: Diagnosis not present

## 2015-04-17 ENCOUNTER — Other Ambulatory Visit: Payer: Self-pay | Admitting: Family Medicine

## 2015-05-11 ENCOUNTER — Other Ambulatory Visit: Payer: Self-pay | Admitting: Family Medicine

## 2015-05-22 DIAGNOSIS — H401111 Primary open-angle glaucoma, right eye, mild stage: Secondary | ICD-10-CM | POA: Diagnosis not present

## 2015-05-22 DIAGNOSIS — H5203 Hypermetropia, bilateral: Secondary | ICD-10-CM | POA: Diagnosis not present

## 2015-05-22 DIAGNOSIS — H52223 Regular astigmatism, bilateral: Secondary | ICD-10-CM | POA: Diagnosis not present

## 2015-05-22 DIAGNOSIS — H401121 Primary open-angle glaucoma, left eye, mild stage: Secondary | ICD-10-CM | POA: Diagnosis not present

## 2015-05-22 DIAGNOSIS — H2513 Age-related nuclear cataract, bilateral: Secondary | ICD-10-CM | POA: Diagnosis not present

## 2015-05-27 NOTE — Telephone Encounter (Signed)
I never got any documentation about this denial - can we see why it was denied and what we need to do to approve alternative?

## 2015-07-10 DIAGNOSIS — H40003 Preglaucoma, unspecified, bilateral: Secondary | ICD-10-CM | POA: Diagnosis not present

## 2015-07-18 NOTE — Telephone Encounter (Signed)
Sorry-this got lost in my in-basket. It is covered, but only a portion. He is still paying ~$120 per inhaler according to pharmacy and that is with a discount card. I spoke with insurance and they said that proventil is covered with $30 co-pay and the ipatroprium nasal spray is covered at ~$6 if those would be options for him. Another option would be the duoneb nebulizer, but it also requires PA.

## 2015-07-21 MED ORDER — ALBUTEROL SULFATE HFA 108 (90 BASE) MCG/ACT IN AERS: 2.0000 | INHALATION_SPRAY | Freq: Four times a day (QID) | RESPIRATORY_TRACT | Status: DC | PRN

## 2015-07-21 NOTE — Addendum Note (Signed)
Addended by: Ria Bush on: 07/21/2015 11:23 AM   Modules accepted: Orders

## 2015-07-21 NOTE — Telephone Encounter (Addendum)
Ok to change combivent inhaler to proventil as more affordable. plz notify patient. Use PRN as rescue inhaler. Continue daily flovent 23mcg 2 puffs BID as controller/maintenance medication. Update [place] if worsening trouble breathing /COPD.

## 2015-07-27 ENCOUNTER — Other Ambulatory Visit: Payer: Self-pay | Admitting: *Deleted

## 2015-07-27 MED ORDER — FLUTICASONE PROPIONATE HFA 110 MCG/ACT IN AERO: 2.0000 | INHALATION_SPRAY | Freq: Two times a day (BID) | RESPIRATORY_TRACT | Status: DC

## 2015-07-27 MED ORDER — PANTOPRAZOLE SODIUM 40 MG PO TBEC: DELAYED_RELEASE_TABLET | ORAL | Status: DC

## 2015-07-27 NOTE — Telephone Encounter (Signed)
Patient notified and Medicare wellness visit scheduled.

## 2015-08-03 DIAGNOSIS — I10 Essential (primary) hypertension: Secondary | ICD-10-CM | POA: Diagnosis not present

## 2015-08-03 DIAGNOSIS — I4891 Unspecified atrial fibrillation: Secondary | ICD-10-CM | POA: Diagnosis not present

## 2015-08-03 DIAGNOSIS — R0602 Shortness of breath: Secondary | ICD-10-CM | POA: Diagnosis not present

## 2015-08-28 ENCOUNTER — Other Ambulatory Visit: Payer: Self-pay | Admitting: Family Medicine

## 2015-08-28 DIAGNOSIS — H401111 Primary open-angle glaucoma, right eye, mild stage: Secondary | ICD-10-CM | POA: Diagnosis not present

## 2015-08-28 DIAGNOSIS — H2513 Age-related nuclear cataract, bilateral: Secondary | ICD-10-CM | POA: Diagnosis not present

## 2015-08-28 DIAGNOSIS — H5203 Hypermetropia, bilateral: Secondary | ICD-10-CM | POA: Diagnosis not present

## 2015-08-28 DIAGNOSIS — H401121 Primary open-angle glaucoma, left eye, mild stage: Secondary | ICD-10-CM | POA: Diagnosis not present

## 2015-08-28 DIAGNOSIS — H52223 Regular astigmatism, bilateral: Secondary | ICD-10-CM | POA: Diagnosis not present

## 2015-08-31 ENCOUNTER — Ambulatory Visit (INDEPENDENT_AMBULATORY_CARE_PROVIDER_SITE_OTHER): Payer: Medicare Other | Admitting: Family Medicine

## 2015-08-31 ENCOUNTER — Encounter: Payer: Self-pay | Admitting: Family Medicine

## 2015-08-31 VITALS — BP 126/84 | HR 64 | Temp 98.0°F | Ht 65.5 in | Wt 153.2 lb

## 2015-08-31 DIAGNOSIS — H409 Unspecified glaucoma: Secondary | ICD-10-CM

## 2015-08-31 DIAGNOSIS — Z87891 Personal history of nicotine dependence: Secondary | ICD-10-CM

## 2015-08-31 DIAGNOSIS — Z0000 Encounter for general adult medical examination without abnormal findings: Secondary | ICD-10-CM

## 2015-08-31 DIAGNOSIS — J449 Chronic obstructive pulmonary disease, unspecified: Secondary | ICD-10-CM | POA: Diagnosis not present

## 2015-08-31 DIAGNOSIS — I4891 Unspecified atrial fibrillation: Secondary | ICD-10-CM

## 2015-08-31 DIAGNOSIS — N433 Hydrocele, unspecified: Secondary | ICD-10-CM

## 2015-08-31 DIAGNOSIS — Z23 Encounter for immunization: Secondary | ICD-10-CM

## 2015-08-31 DIAGNOSIS — N289 Disorder of kidney and ureter, unspecified: Secondary | ICD-10-CM | POA: Diagnosis not present

## 2015-08-31 DIAGNOSIS — R1900 Intra-abdominal and pelvic swelling, mass and lump, unspecified site: Secondary | ICD-10-CM

## 2015-08-31 DIAGNOSIS — Z1211 Encounter for screening for malignant neoplasm of colon: Secondary | ICD-10-CM

## 2015-08-31 DIAGNOSIS — R7303 Prediabetes: Secondary | ICD-10-CM

## 2015-08-31 DIAGNOSIS — R21 Rash and other nonspecific skin eruption: Secondary | ICD-10-CM

## 2015-08-31 DIAGNOSIS — R946 Abnormal results of thyroid function studies: Secondary | ICD-10-CM | POA: Diagnosis not present

## 2015-08-31 DIAGNOSIS — E785 Hyperlipidemia, unspecified: Secondary | ICD-10-CM

## 2015-08-31 DIAGNOSIS — Z7189 Other specified counseling: Secondary | ICD-10-CM

## 2015-08-31 DIAGNOSIS — Z1159 Encounter for screening for other viral diseases: Secondary | ICD-10-CM | POA: Diagnosis not present

## 2015-08-31 LAB — COMPREHENSIVE METABOLIC PANEL
ALT: 44 U/L (ref 0–53)
AST: 30 U/L (ref 0–37)
Albumin: 4.2 g/dL (ref 3.5–5.2)
Alkaline Phosphatase: 50 U/L (ref 39–117)
BUN: 14 mg/dL (ref 6–23)
CHLORIDE: 104 meq/L (ref 96–112)
CO2: 31 mEq/L (ref 19–32)
Calcium: 9.6 mg/dL (ref 8.4–10.5)
Creatinine, Ser: 1.25 mg/dL (ref 0.40–1.50)
GFR: 61.19 mL/min (ref >60.00)
GLUCOSE: 100 mg/dL — AB (ref 70–99)
POTASSIUM: 4.6 meq/L (ref 3.5–5.1)
SODIUM: 140 meq/L (ref 135–145)
TOTAL PROTEIN: 6.7 g/dL (ref 6.0–8.3)
Total Bilirubin: 0.6 mg/dL (ref 0.2–1.2)

## 2015-08-31 LAB — LIPID PANEL
CHOLESTEROL: 224 mg/dL — AB (ref 0–200)
HDL: 48.3 mg/dL (ref >39.00)
LDL CALC: 154 mg/dL — AB (ref 0–99)
NONHDL: 175.27
Total CHOL/HDL Ratio: 5
Triglycerides: 106 mg/dL (ref 0.0–149.0)
VLDL: 21.2 mg/dL (ref 0.0–40.0)

## 2015-08-31 LAB — TSH: TSH: 1.39 u[IU]/mL (ref 0.35–4.50)

## 2015-08-31 LAB — T4, FREE: FREE T4: 0.89 ng/dL (ref 0.60–1.60)

## 2015-08-31 LAB — HEMOGLOBIN A1C: Hgb A1c MFr Bld: 6.3 % (ref 4.6–6.5)

## 2015-08-31 NOTE — Assessment & Plan Note (Signed)
Check labs 

## 2015-08-31 NOTE — Progress Notes (Signed)
Pre visit review using our clinic review tool, if applicable. No additional management support is needed unless otherwise documented below in the visit note. 

## 2015-08-31 NOTE — Assessment & Plan Note (Signed)
Not regular with flovent. Takes combivent or proair regularly. Discussed increased flovent use and discussed rescue inhaler use.

## 2015-08-31 NOTE — Addendum Note (Signed)
Addended by: Royann Shivers A on: 08/31/2015 08:57 AM   Modules accepted: Orders

## 2015-08-31 NOTE — Assessment & Plan Note (Signed)
Groin skin discomfort noted over last few months. No rash present today. No significant discharge or erythema. Unclear etiology - continue to monitor.

## 2015-08-31 NOTE — Assessment & Plan Note (Signed)
Discussed lung cancer screening - pt agrees to referral today.

## 2015-08-31 NOTE — Assessment & Plan Note (Signed)
Regularly sees ophtho.  

## 2015-08-31 NOTE — Assessment & Plan Note (Signed)
Check FLP today. 

## 2015-08-31 NOTE — Patient Instructions (Addendum)
Labs today.  Pass by lab to pick up stool kit.  We will refer you for lung cancer screening nurse visit.  Pneumovax today.  Bring [place] a copy of your advanced directives to update your chart. For groin - try lotrimin antifungal over the counter when bothering you.  Return as needed or in 1 year for next medicare wellness visit   Health Maintenance, Male A healthy lifestyle and preventative care can promote health and wellness.  Maintain regular health, dental, and eye exams.  Eat a healthy diet. Foods like vegetables, fruits, whole grains, low-fat dairy products, and lean protein foods contain the nutrients you need and are low in calories. Decrease your intake of foods high in solid fats, added sugars, and salt. Get information about a proper diet from your health care provider, if necessary.  Regular physical exercise is one of the most important things you can do for your health. Most adults should get at least 150 minutes of moderate-intensity exercise (any activity that increases your heart rate and causes you to sweat) each week. In addition, most adults need muscle-strengthening exercises on 2 or more days a week.   Maintain a healthy weight. The body mass index (BMI) is a screening tool to identify possible weight problems. It provides an estimate of body fat based on height and weight. Your health care provider can find your BMI and can help you achieve or maintain a healthy weight. For males 20 years and older:  A BMI below 18.5 is considered underweight.  A BMI of 18.5 to 24.9 is normal.  A BMI of 25 to 29.9 is considered overweight.  A BMI of 30 and above is considered obese.  Maintain normal blood lipids and cholesterol by exercising and minimizing your intake of saturated fat. Eat a balanced diet with plenty of fruits and vegetables. Blood tests for lipids and cholesterol should begin at age 63 and be repeated every 5 years. If your lipid or cholesterol levels are high, you  are over age 56, or you are at high risk for heart disease, you may need your cholesterol levels checked more frequently.Ongoing high lipid and cholesterol levels should be treated with medicines if diet and exercise are not working.  If you smoke, find out from your health care provider how to quit. If you do not use tobacco, do not start.  Lung cancer screening is recommended for adults aged 33-80 years who are at high risk for developing lung cancer because of a history of smoking. A yearly low-dose CT scan of the lungs is recommended for people who have at least a 30-pack-year history of smoking and are current smokers or have quit within the past 15 years. A pack year of smoking is smoking an average of 1 pack of cigarettes a day for 1 year (for example, a 30-pack-year history of smoking could mean smoking 1 pack a day for 30 years or 2 packs a day for 15 years). Yearly screening should continue until the smoker has stopped smoking for at least 15 years. Yearly screening should be stopped for people who develop a health problem that would prevent them from having lung cancer treatment.  If you choose to drink alcohol, do not have more than 2 drinks per day. One drink is considered to be 12 oz (360 mL) of beer, 5 oz (150 mL) of wine, or 1.5 oz (45 mL) of liquor.  Avoid the use of street drugs. Do not share needles with anyone. Ask for  help if you need support or instructions about stopping the use of drugs.  High blood pressure causes heart disease and increases the risk of stroke. High blood pressure is more likely to develop in:  People who have blood pressure in the end of the normal range (100-139/85-89 mm Hg).  People who are overweight or obese.  People who are African American.  If you are 81-13 years of age, have your blood pressure checked every 3-5 years. If you are 43 years of age or older, have your blood pressure checked every year. You should have your blood pressure measured  twice--once when you are at a hospital or clinic, and once when you are not at a hospital or clinic. Record the average of the two measurements. To check your blood pressure when you are not at a hospital or clinic, you can use:  An automated blood pressure machine at a pharmacy.  A home blood pressure monitor.  If you are 30-79 years old, ask your health care provider if you should take aspirin to prevent heart disease.  Diabetes screening involves taking a blood sample to check your fasting blood sugar level. This should be done once every 3 years after age 60 if you are at a normal weight and without risk factors for diabetes. Testing should be considered at a younger age or be carried out more frequently if you are overweight and have at least 1 risk factor for diabetes.  Colorectal cancer can be detected and often prevented. Most routine colorectal cancer screening begins at the age of 19 and continues through age 76. However, your health care provider may recommend screening at an earlier age if you have risk factors for colon cancer. On a yearly basis, your health care provider may provide home test kits to check for hidden blood in the stool. A small camera at the end of a tube may be used to directly examine the colon (sigmoidoscopy or colonoscopy) to detect the earliest forms of colorectal cancer. Talk to your health care provider about this at age 68 when routine screening begins. A direct exam of the colon should be repeated every 5-10 years through age 15, unless early forms of precancerous polyps or small growths are found.  People who are at an increased risk for hepatitis B should be screened for this virus. You are considered at high risk for hepatitis B if:  You were born in a country where hepatitis B occurs often. Talk with your health care provider about which countries are considered high risk.  Your parents were born in a high-risk country and you have not received a shot to  protect against hepatitis B (hepatitis B vaccine).  You have HIV or AIDS.  You use needles to inject street drugs.  You live with, or have sex with, someone who has hepatitis B.  You are a man who has sex with other men (MSM).  You get hemodialysis treatment.  You take certain medicines for conditions like cancer, organ transplantation, and autoimmune conditions.  Hepatitis C blood testing is recommended for all people born from [birth year] through 1967 and any individual with known risk factors for hepatitis C.  Healthy men should no longer receive prostate-specific antigen (PSA) blood tests as part of routine cancer screening. Talk to your health care provider about prostate cancer screening.  Testicular cancer screening is not recommended for adolescents or adult males who have no symptoms. Screening includes self-exam, a health care provider exam, and other screening  tests. Consult with your health care provider about any symptoms you have or any concerns you have about testicular cancer.  Practice safe sex. Use condoms and avoid high-risk sexual practices to reduce the spread of sexually transmitted infections (STIs).  You should be screened for STIs, including gonorrhea and chlamydia if:  You are sexually active and are younger than 24 years.  You are older than 24 years, and your health care provider tells you that you are at risk for this type of infection.  Your sexual activity has changed since you were last screened, and you are at an increased risk for chlamydia or gonorrhea. Ask your health care provider if you are at risk.  If you are at risk of being infected with HIV, it is recommended that you take a prescription medicine daily to prevent HIV infection. This is called pre-exposure prophylaxis (PrEP). You are considered at risk if:  You are a man who has sex with other men (MSM).  You are a heterosexual man who is sexually active with multiple partners.  You take drugs by  injection.  You are sexually active with a partner who has HIV.  Talk with your health care provider about whether you are at high risk of being infected with HIV. If you choose to begin PrEP, you should first be tested for HIV. You should then be tested every 3 months for as long as you are taking PrEP.  Use sunscreen. Apply sunscreen liberally and repeatedly throughout the day. You should seek shade when your shadow is shorter than you. Protect yourself by wearing long sleeves, pants, a wide-brimmed hat, and sunglasses year round whenever you are outdoors.  Tell your health care provider of new moles or changes in moles, especially if there is a change in shape or color. Also, tell your health care provider if a mole is larger than the size of a pencil eraser.  A one-time screening for abdominal aortic aneurysm (AAA) and surgical repair of large AAAs by ultrasound is recommended for men aged 20-75 years who are current or former smokers.  Stay current with your vaccines (immunizations).   This information is not intended to replace advice given to you by your health care provider. Make sure you discuss any questions you have with your health care provider.   Document Released: 07/19/2007 Document Revised: 02/10/2014 Document Reviewed: 06/17/2010 Elsevier Interactive Patient Education [employer].

## 2015-08-31 NOTE — Progress Notes (Signed)
BP 126/84   Pulse 64   Temp 98 F (36.7 C) (Oral)   Ht 5' 5.5" (1.664 m)   Wt 153 lb 4 oz (69.5 kg)   BMI 25.11 kg/m    CC: medicare wellness visit Subjective:    Patient ID: Victor Cortez, male    DOB: 07/05/1950, 65 y.o.   MRN: [id number]  HPI: Victor Cortez is a 65 y.o. male presenting on 08/31/2015 for Annual Exam   Large spermatocele - referred to urology. Saw Dr Junious Silk, decided not to proceed with surgery.   S/p R rotator cuff repair 2016.   Noticing sensitive area in groin with malodor and itching. No urethral discharge, fevers. Ongoing for 1-2 months. Hasn't tried anything for this.   Hearing screen - passed Vision screen - at eye center Fall risk screen - passed Depression screen - passed  Preventative: Colon cancer screening - >10 yrs ago with possible polyp Olevia Perches). Declines rpt colonoscopy. Requests ifob.  Prostate cancer screening - discussed. Would like to defer screening at this time.  Lung cancer screening - +fmhx (father). 100+ PY hx.  Flu shot - yearly Tetanus shot - 2014 Pneumovax 2012, prevnar 07/2013. Pneumovax today.  Shingles shot - 2013 Advanced directive discussion - has living will at home. Wife Velva Harman is HCPOA. Asked to bring [place] a copy. 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 Interim medical history since our last visit reviewed. Allergies and medications reviewed and updated. Current Outpatient Prescriptions on File Prior to Visit  Medication Sig  . albuterol (PROVENTIL HFA;VENTOLIN HFA) 108 (90 Base) MCG/ACT inhaler Inhale 2 puffs into the lungs every 6 (six) hours as needed for wheezing or  shortness of breath.  Marland Kitchen aspirin EC 81 MG tablet Take by mouth. 1 tab by mouth in AM  . brimonidine (ALPHAGAN) 0.15 % ophthalmic solution   . COMBIVENT RESPIMAT 20-100 MCG/ACT AERS respimat INHALE 1 PUFF EVERY 6 HOURS AS NEEDED FOR WHEEZING  . Cyanocobalamin (RA VITAMIN B-12 TR) 1000 MCG TBCR Take by mouth. One tab by mouth daily  . fluticasone (FLOVENT HFA) 110 MCG/ACT inhaler Inhale 2 puffs into the lungs 2 (two) times daily.  . Glucosamine-Chondroit-Vit C-Mn (GLUCOSAMINE 1500 COMPLEX) CAPS Take 1 capsule by mouth daily.  . Multiple Vitamin (MULTI-VITAMINS) TABS Take by mouth. 1 tab by mouth daily  . Omega-3 Fatty Acids (FISH OIL) 1000 MG CAPS Take by mouth. 1 cap by mouth daily  . pantoprazole (PROTONIX) 40 MG tablet TAKE ONE TABLET BY MOUTH EVERY DAY  . sotalol (BETAPACE) 80 MG tablet Take by mouth. One tab by mouth twice a day  . Terbinafine HCl POWD Powder in shoes daily   No current facility-administered medications on file prior to visit.     Review of Systems Per HPI unless specifically indicated in ROS section     Objective:    BP 126/84   Pulse 64   Temp 98 F (36.7 C) (Oral)   Ht 5' 5.5" (1.664 m)   Wt 153 lb 4 oz (69.5 kg)   BMI 25.11 kg/m   Wt Readings from Last 3 Encounters:  08/31/15 153 lb 4 oz (69.5 kg)  12/05/14 156 lb  14.4 oz (71.2 kg)  11/21/14 160 lb 4 oz (72.7 kg)    Physical Exam  Constitutional: He is oriented to person, place, and time. He appears well-developed and well-nourished. No distress.  HENT:  Head: Normocephalic and atraumatic.  Right Ear: Hearing, tympanic membrane, external ear and ear canal normal.  Left Ear: Hearing, tympanic membrane, external ear and ear canal normal.  Nose: Nose normal.  Mouth/Throat: Uvula is midline, oropharynx is clear and moist and mucous membranes are normal. No oropharyngeal exudate, posterior oropharyngeal edema or posterior oropharyngeal erythema.  Eyes: Conjunctivae and EOM are normal. Pupils are equal,  round, and reactive to light. No scleral icterus.  Neck: Normal range of motion. Neck supple. Carotid bruit is not present. No thyromegaly present.  Cardiovascular: Normal rate, regular rhythm, normal heart sounds and intact distal pulses.   No murmur heard. Pulses:      Radial pulses are 2+ on the right side, and 2+ on the left side.  Pulmonary/Chest: Effort normal and breath sounds normal. No respiratory distress. He has no wheezes. He has no rales.  Coarse   Abdominal: Soft. Bowel sounds are normal. He exhibits no distension and no mass. There is no tenderness. There is no rebound and no guarding.  Genitourinary:  Genitourinary Comments: Large fluid filled cyst R scrotum  Musculoskeletal: Normal range of motion. He exhibits no edema.  Lymphadenopathy:    He has no cervical adenopathy.  Neurological: He is alert and oriented to person, place, and time.  CN grossly intact, station and gait intact Recall 3/3 Calculation 4/5 serial 3s  Skin: Skin is warm and dry. Rash noted.  Scaling pustular rash on left lateral sole  Psychiatric: He has a normal mood and affect. His behavior is normal. Judgment and thought content normal.  Nursing note and vitals reviewed.  Results for orders placed or performed in visit on 02/23/14  Renal function panel  Result Value Ref Range   Sodium 140 135 - 145 mEq/L   Potassium 4.4 3.5 - 5.1 mEq/L   Chloride 102 96 - 112 mEq/L   CO2 29 19 - 32 mEq/L   Calcium 10.2 8.4 - 10.5 mg/dL   Albumin 4.4 3.5 - 5.2 g/dL   BUN 12 6 - 23 mg/dL   Creatinine, Ser 1.24 0.40 - 1.50 mg/dL   Glucose, Bld 106 (H) 70 - 99 mg/dL   Phosphorus 3.8 2.3 - 4.6 mg/dL   GFR 62.05 >60.00 mL/min      Assessment & Plan:   Problem List Items Addressed This Visit    Advanced care planning/counseling discussion    Advanced directive discussion - has living will at home. Wife Velva Harman is HCPOA. Asked to bring [place] a copy.      COPD (chronic obstructive pulmonary disease) ([place])    Not  regular with flovent. Takes combivent or proair regularly. Discussed increased flovent use and discussed rescue inhaler use.       Ex-smoker    Discussed lung cancer screening - pt agrees to referral today.       Relevant Orders   Ambulatory Referral for Lung Cancer Scre   Glaucoma    Regularly sees ophtho      Groin fullness    Groin skin discomfort noted over last few months. No rash present today. No significant discharge or erythema. Unclear etiology - continue to monitor.       Hyperlipidemia    Check FLP today.       Relevant Orders  Lipid panel   Comprehensive metabolic panel   Lone atrial fibrillation with RVR    Followed by Dr Humphrey Rolls cardiology. On sotalol 80mg  bid.       Medicare annual wellness visit, initial - Primary    I have personally reviewed the Medicare Annual Wellness questionnaire and have noted 1. The patient's medical and social history 2. Their use of alcohol, tobacco or illicit drugs 3. Their current medications and supplements 4. The patient's functional ability including ADL's, fall risks, home safety risks and hearing or visual impairment. Cognitive function has been assessed and addressed as indicated.  5. Diet and physical activity 6. Evidence for depression or mood disorders The patients weight, height, BMI have been recorded in the chart. I have made referrals, counseling and provided education to the patient based on review of the above and I have provided the pt with a written personalized care plan for preventive services. Provider list updated.. See scanned questionairre as needed for further documentation. Reviewed preventative protocols and updated unless pt declined.       Renal insufficiency    Check labs.       Relevant Orders   Comprehensive metabolic panel   Right hydrocele    Large. Pt deferred surgery at this time.       Skin rash    Skin rash of foot. Prior thought related to tinea pedis. Has not responded to  treatment. Pt will try clotrimazole cream. Did not try terbinafine powder.        Other Visit Diagnoses    Special screening for malignant neoplasms, colon       Relevant Orders   Fecal occult blood, imunochemical   Prediabetes       Relevant Orders   Hemoglobin A1c   Abnormal thyroid function test       Relevant Orders   TSH   T4, free   Need for hepatitis C screening test       Relevant Orders   Hepatitis C antibody       Follow up plan: Return in about 1 year (around 08/30/2016), or as needed, for medicare wellness visit.  Ria Bush, MD

## 2015-08-31 NOTE — Assessment & Plan Note (Signed)
Large. Pt deferred surgery at this time.

## 2015-08-31 NOTE — Assessment & Plan Note (Signed)
Skin rash of foot. Prior thought related to tinea pedis. Has not responded to treatment. Pt will try clotrimazole cream. Did not try terbinafine powder.

## 2015-08-31 NOTE — Assessment & Plan Note (Signed)
Followed by Dr Humphrey Rolls cardiology. On sotalol 80mg  bid.

## 2015-08-31 NOTE — Assessment & Plan Note (Signed)
Advanced directive discussion - has living will at home. Wife Velva Harman is HCPOA. Asked to bring [place] a copy.

## 2015-08-31 NOTE — Assessment & Plan Note (Signed)

## 2015-09-01 LAB — HEPATITIS C ANTIBODY: HCV Ab: NEGATIVE

## 2015-09-07 ENCOUNTER — Encounter: Payer: Self-pay | Admitting: *Deleted

## 2015-09-18 ENCOUNTER — Telehealth: Payer: Self-pay | Admitting: *Deleted

## 2015-09-18 NOTE — Telephone Encounter (Signed)
Received referral for initial lung cancer screening scan. Contacted patient and obtained smoking history,(former, quit 06/03/13, 106pack year history) as well as answering questions related to screening process. Patient denies signs of lung cancer such as weight loss or hemoptysis. Patient denies comorbidity that would prevent curative treatment if lung cancer were found. Patient is tentatively scheduled for shared decision making visit and CT scan on 09/25/15 at 130pm, pending insurance approval from business office.

## 2015-09-21 ENCOUNTER — Other Ambulatory Visit: Payer: Self-pay | Admitting: *Deleted

## 2015-09-21 DIAGNOSIS — H401121 Primary open-angle glaucoma, left eye, mild stage: Secondary | ICD-10-CM | POA: Diagnosis not present

## 2015-09-21 DIAGNOSIS — H2513 Age-related nuclear cataract, bilateral: Secondary | ICD-10-CM | POA: Diagnosis not present

## 2015-09-21 DIAGNOSIS — H01004 Unspecified blepharitis left upper eyelid: Secondary | ICD-10-CM | POA: Diagnosis not present

## 2015-09-21 DIAGNOSIS — H401111 Primary open-angle glaucoma, right eye, mild stage: Secondary | ICD-10-CM | POA: Diagnosis not present

## 2015-09-21 DIAGNOSIS — H01001 Unspecified blepharitis right upper eyelid: Secondary | ICD-10-CM | POA: Diagnosis not present

## 2015-09-21 DIAGNOSIS — H5203 Hypermetropia, bilateral: Secondary | ICD-10-CM | POA: Diagnosis not present

## 2015-09-21 DIAGNOSIS — H52223 Regular astigmatism, bilateral: Secondary | ICD-10-CM | POA: Diagnosis not present

## 2015-09-21 DIAGNOSIS — Z87891 Personal history of nicotine dependence: Secondary | ICD-10-CM

## 2015-09-25 ENCOUNTER — Ambulatory Visit
Admission: RE | Admit: 2015-09-25 | Discharge: 2015-09-25 | Disposition: A | Discharge status: Home / Self Care | Payer: Medicare Other | Source: Ambulatory Visit | Attending: Oncology | Admitting: Oncology

## 2015-09-25 ENCOUNTER — Inpatient Hospital Stay: Payer: Medicare Other | Attending: Oncology | Admitting: Oncology

## 2015-09-25 ENCOUNTER — Encounter (INDEPENDENT_AMBULATORY_CARE_PROVIDER_SITE_OTHER): Payer: Self-pay

## 2015-09-25 DIAGNOSIS — Z122 Encounter for screening for malignant neoplasm of respiratory organs: Secondary | ICD-10-CM | POA: Diagnosis not present

## 2015-09-25 DIAGNOSIS — I2510 Atherosclerotic heart disease of native coronary artery without angina pectoris: Secondary | ICD-10-CM | POA: Insufficient documentation

## 2015-09-25 DIAGNOSIS — Z87891 Personal history of nicotine dependence: Secondary | ICD-10-CM

## 2015-09-25 DIAGNOSIS — H5203 Hypermetropia, bilateral: Secondary | ICD-10-CM | POA: Diagnosis not present

## 2015-09-25 DIAGNOSIS — K760 Fatty (change of) liver, not elsewhere classified: Secondary | ICD-10-CM | POA: Diagnosis not present

## 2015-09-25 DIAGNOSIS — H52223 Regular astigmatism, bilateral: Secondary | ICD-10-CM | POA: Diagnosis not present

## 2015-09-25 DIAGNOSIS — H01005 Unspecified blepharitis left lower eyelid: Secondary | ICD-10-CM | POA: Diagnosis not present

## 2015-09-25 DIAGNOSIS — I700 Atherosclerosis of aorta: Secondary | ICD-10-CM | POA: Diagnosis not present

## 2015-09-25 DIAGNOSIS — H401121 Primary open-angle glaucoma, left eye, mild stage: Secondary | ICD-10-CM | POA: Diagnosis not present

## 2015-09-25 DIAGNOSIS — H401111 Primary open-angle glaucoma, right eye, mild stage: Secondary | ICD-10-CM | POA: Diagnosis not present

## 2015-09-25 DIAGNOSIS — H2513 Age-related nuclear cataract, bilateral: Secondary | ICD-10-CM | POA: Diagnosis not present

## 2015-09-25 DIAGNOSIS — H01002 Unspecified blepharitis right lower eyelid: Secondary | ICD-10-CM | POA: Diagnosis not present

## 2015-09-27 ENCOUNTER — Telehealth: Payer: Self-pay | Admitting: *Deleted

## 2015-09-27 ENCOUNTER — Ambulatory Visit: Payer: Medicare Other | Admitting: Oncology

## 2015-09-27 ENCOUNTER — Encounter: Payer: Self-pay | Admitting: Family Medicine

## 2015-09-27 NOTE — Telephone Encounter (Signed)
Notified patient of LDCT lung cancer screening results with recommendation for 12 month follow up imaging. Also notified of incidental finding noted below. Patient verbalizes understanding.   IMPRESSION: 1. Lung-RADS Category 2, benign appearance or behavior. Continue annual screening with low-dose chest CT without contrast in 12 months. 2.  Coronary artery atherosclerosis. Aortic atherosclerosis. 3. Hepatic steatosis.

## 2015-10-01 NOTE — Progress Notes (Signed)
In accordance with CMS guidelines, patient has met eligibility criteria including age, absence of signs or symptoms of lung cancer.  Social History  Substance Use Topics  . Smoking status: Former Smoker    Packs/day: 2.00    Years: 53.00    Types: Cigarettes    Quit date: 06/03/2013  . Smokeless tobacco: Never Used  . Alcohol use No     A shared decision-making session was conducted prior to the performance of CT scan. This includes one or more decision aids, includes benefits and harms of screening, follow-up diagnostic testing, over-diagnosis, false positive rate, and total radiation exposure.  Counseling on the importance of adherence to annual lung cancer LDCT screening, impact of co-morbidities, and ability or willingness to undergo diagnosis and treatment is imperative for compliance of the program.  Counseling on the importance of continued smoking cessation for former smokers; the importance of smoking cessation for current smokers, and information about tobacco cessation interventions have been given to patient including [place] and 1800 quit [place] programs.  Written order for lung cancer screening with LDCT has been given to the patient and any and all questions have been answered to the best of my abilities.   Yearly follow up will be coordinated by Burgess Estelle, Thoracic Navigator.

## 2015-10-23 DIAGNOSIS — H52223 Regular astigmatism, bilateral: Secondary | ICD-10-CM | POA: Diagnosis not present

## 2015-10-23 DIAGNOSIS — H5203 Hypermetropia, bilateral: Secondary | ICD-10-CM | POA: Diagnosis not present

## 2015-10-23 DIAGNOSIS — H2513 Age-related nuclear cataract, bilateral: Secondary | ICD-10-CM | POA: Diagnosis not present

## 2015-10-23 DIAGNOSIS — H01002 Unspecified blepharitis right lower eyelid: Secondary | ICD-10-CM | POA: Diagnosis not present

## 2015-10-23 DIAGNOSIS — H47233 Glaucomatous optic atrophy, bilateral: Secondary | ICD-10-CM | POA: Diagnosis not present

## 2015-10-23 DIAGNOSIS — H401131 Primary open-angle glaucoma, bilateral, mild stage: Secondary | ICD-10-CM | POA: Diagnosis not present

## 2015-10-23 DIAGNOSIS — H01005 Unspecified blepharitis left lower eyelid: Secondary | ICD-10-CM | POA: Diagnosis not present

## 2015-11-13 DIAGNOSIS — Z23 Encounter for immunization: Secondary | ICD-10-CM | POA: Diagnosis not present

## 2015-11-27 DIAGNOSIS — I4891 Unspecified atrial fibrillation: Secondary | ICD-10-CM | POA: Diagnosis not present

## 2015-11-27 DIAGNOSIS — R0602 Shortness of breath: Secondary | ICD-10-CM | POA: Diagnosis not present

## 2015-11-27 DIAGNOSIS — I10 Essential (primary) hypertension: Secondary | ICD-10-CM | POA: Diagnosis not present

## 2015-12-29 ENCOUNTER — Other Ambulatory Visit: Payer: Self-pay | Admitting: Family Medicine

## 2015-12-31 NOTE — Telephone Encounter (Signed)
Refill sent per [facility] refill protocol/SLS  

## 2016-01-10 DIAGNOSIS — H40003 Preglaucoma, unspecified, bilateral: Secondary | ICD-10-CM | POA: Diagnosis not present

## 2016-01-22 DIAGNOSIS — H40003 Preglaucoma, unspecified, bilateral: Secondary | ICD-10-CM | POA: Diagnosis not present

## 2016-03-14 ENCOUNTER — Other Ambulatory Visit: Payer: Self-pay | Admitting: Family Medicine

## 2016-04-29 DIAGNOSIS — I10 Essential (primary) hypertension: Secondary | ICD-10-CM | POA: Diagnosis not present

## 2016-04-29 DIAGNOSIS — R0602 Shortness of breath: Secondary | ICD-10-CM | POA: Diagnosis not present

## 2016-04-29 DIAGNOSIS — I4891 Unspecified atrial fibrillation: Secondary | ICD-10-CM | POA: Diagnosis not present

## 2016-05-08 ENCOUNTER — Other Ambulatory Visit: Payer: Self-pay | Admitting: Family Medicine

## 2016-05-29 DIAGNOSIS — H40003 Preglaucoma, unspecified, bilateral: Secondary | ICD-10-CM | POA: Diagnosis not present

## 2016-06-19 DIAGNOSIS — H40003 Preglaucoma, unspecified, bilateral: Secondary | ICD-10-CM | POA: Diagnosis not present

## 2016-07-21 ENCOUNTER — Other Ambulatory Visit: Payer: Self-pay | Admitting: Family Medicine

## 2016-09-17 ENCOUNTER — Telehealth: Payer: Self-pay | Admitting: *Deleted

## 2016-09-17 DIAGNOSIS — Z87891 Personal history of nicotine dependence: Secondary | ICD-10-CM

## 2016-09-17 DIAGNOSIS — Z122 Encounter for screening for malignant neoplasm of respiratory organs: Secondary | ICD-10-CM

## 2016-09-17 NOTE — Telephone Encounter (Signed)
CT appt schd and conf with patient for 09/24/16 @ 1:30 pm @ [facility], per 09/17/16  schd msg/Shawn Perkins.

## 2016-09-17 NOTE — Telephone Encounter (Signed)
Notified patient that annual lung cancer screening low dose CT scan is due currently or will be in near future. Confirmed that patient is within the age range of 55-77, and asymptomatic, (no signs or symptoms of lung cancer). Patient denies illness that would prevent curative treatment for lung cancer if found. Verified smoking history, (former, quit, 06/03/13, 106 pack year). The shared decision making visit was done 09/25/15. Patient is agreeable for CT scan being scheduled.

## 2016-09-24 ENCOUNTER — Ambulatory Visit
Admission: RE | Admit: 2016-09-24 | Discharge: 2016-09-24 | Disposition: A | Discharge status: Home / Self Care | Payer: Medicare Other | Source: Ambulatory Visit | Attending: Oncology | Admitting: Oncology

## 2016-09-24 DIAGNOSIS — Z87891 Personal history of nicotine dependence: Secondary | ICD-10-CM | POA: Diagnosis not present

## 2016-09-24 DIAGNOSIS — I2510 Atherosclerotic heart disease of native coronary artery without angina pectoris: Secondary | ICD-10-CM | POA: Insufficient documentation

## 2016-09-24 DIAGNOSIS — R935 Abnormal findings on diagnostic imaging of other abdominal regions, including retroperitoneum: Secondary | ICD-10-CM | POA: Insufficient documentation

## 2016-09-24 DIAGNOSIS — Z122 Encounter for screening for malignant neoplasm of respiratory organs: Secondary | ICD-10-CM | POA: Insufficient documentation

## 2016-09-24 DIAGNOSIS — J439 Emphysema, unspecified: Secondary | ICD-10-CM | POA: Insufficient documentation

## 2016-09-24 DIAGNOSIS — K760 Fatty (change of) liver, not elsewhere classified: Secondary | ICD-10-CM | POA: Insufficient documentation

## 2016-09-24 DIAGNOSIS — I700 Atherosclerosis of aorta: Secondary | ICD-10-CM | POA: Insufficient documentation

## 2016-09-29 ENCOUNTER — Encounter: Payer: Self-pay | Admitting: *Deleted

## 2016-09-29 ENCOUNTER — Encounter: Payer: Self-pay | Admitting: Family Medicine

## 2016-09-29 DIAGNOSIS — I2510 Atherosclerotic heart disease of native coronary artery without angina pectoris: Secondary | ICD-10-CM | POA: Insufficient documentation

## 2016-09-29 DIAGNOSIS — I700 Atherosclerosis of aorta: Secondary | ICD-10-CM | POA: Insufficient documentation

## 2016-10-09 DIAGNOSIS — I4891 Unspecified atrial fibrillation: Secondary | ICD-10-CM | POA: Diagnosis not present

## 2016-10-09 DIAGNOSIS — R0602 Shortness of breath: Secondary | ICD-10-CM | POA: Diagnosis not present

## 2016-10-09 DIAGNOSIS — I2510 Atherosclerotic heart disease of native coronary artery without angina pectoris: Secondary | ICD-10-CM | POA: Diagnosis not present

## 2016-10-09 DIAGNOSIS — I10 Essential (primary) hypertension: Secondary | ICD-10-CM | POA: Diagnosis not present

## 2016-10-09 DIAGNOSIS — E785 Hyperlipidemia, unspecified: Secondary | ICD-10-CM | POA: Diagnosis not present

## 2016-11-11 DIAGNOSIS — Z23 Encounter for immunization: Secondary | ICD-10-CM | POA: Diagnosis not present

## 2016-11-28 ENCOUNTER — Telehealth: Payer: Self-pay | Admitting: Family Medicine

## 2016-11-28 NOTE — Telephone Encounter (Signed)
Copied from [place] [phone]. Topic: Quick Communication - See Telephone Encounter >> Nov 28, 2016  4:34 PM Neva Seat wrote: Reason for CRM:   Vitamin B-12 shot  Uses pharmacy [facility] in [place]

## 2016-11-28 NOTE — Telephone Encounter (Signed)
Pt requesting B 12 shot

## 2016-11-28 NOTE — Telephone Encounter (Signed)
Pt last seen annual 08/31/15; on med list pt can take Vit B 12 orally. No future appt scheduled. No Vit B 12 labs seen. Please advise.

## 2016-11-28 NOTE — Telephone Encounter (Signed)
I don't see we've ever been on B12 shots. Recommend we discuss this at next physical.

## 2016-12-01 NOTE — Telephone Encounter (Signed)
Left message on vm per pt (see previous message) at [phone] relaying message per Dr. Darnell Level.

## 2016-12-01 NOTE — Telephone Encounter (Signed)
Attempted to contact pt to relay Dr. Synthia Innocent message. No answer, vm box has not been set up.

## 2016-12-01 NOTE — Telephone Encounter (Signed)
Pt is returning a call for his B12. He says try to call him back again. He is at work so he isnt sure when he can answer. Call his home phone is possible and leave on his answering machine. [phone]

## 2016-12-09 DIAGNOSIS — Z716 Tobacco abuse counseling: Secondary | ICD-10-CM | POA: Diagnosis not present

## 2016-12-09 DIAGNOSIS — I10 Essential (primary) hypertension: Secondary | ICD-10-CM | POA: Diagnosis not present

## 2016-12-09 DIAGNOSIS — F17210 Nicotine dependence, cigarettes, uncomplicated: Secondary | ICD-10-CM | POA: Diagnosis not present

## 2016-12-09 DIAGNOSIS — E782 Mixed hyperlipidemia: Secondary | ICD-10-CM | POA: Diagnosis not present

## 2016-12-09 DIAGNOSIS — R0602 Shortness of breath: Secondary | ICD-10-CM | POA: Diagnosis not present

## 2016-12-09 DIAGNOSIS — I4891 Unspecified atrial fibrillation: Secondary | ICD-10-CM | POA: Diagnosis not present

## 2016-12-12 DIAGNOSIS — E782 Mixed hyperlipidemia: Secondary | ICD-10-CM | POA: Diagnosis not present

## 2016-12-12 DIAGNOSIS — I10 Essential (primary) hypertension: Secondary | ICD-10-CM | POA: Diagnosis not present

## 2016-12-18 DIAGNOSIS — H40003 Preglaucoma, unspecified, bilateral: Secondary | ICD-10-CM | POA: Diagnosis not present

## 2017-01-01 DIAGNOSIS — H40003 Preglaucoma, unspecified, bilateral: Secondary | ICD-10-CM | POA: Diagnosis not present

## 2017-01-08 ENCOUNTER — Other Ambulatory Visit: Payer: Self-pay

## 2017-03-14 ENCOUNTER — Other Ambulatory Visit: Payer: Self-pay | Admitting: Family Medicine

## 2017-03-19 DIAGNOSIS — I480 Paroxysmal atrial fibrillation: Secondary | ICD-10-CM | POA: Diagnosis not present

## 2017-03-19 DIAGNOSIS — E785 Hyperlipidemia, unspecified: Secondary | ICD-10-CM | POA: Diagnosis not present

## 2017-03-19 DIAGNOSIS — I10 Essential (primary) hypertension: Secondary | ICD-10-CM | POA: Diagnosis not present

## 2017-03-20 DIAGNOSIS — I10 Essential (primary) hypertension: Secondary | ICD-10-CM | POA: Diagnosis not present

## 2017-03-20 DIAGNOSIS — I4891 Unspecified atrial fibrillation: Secondary | ICD-10-CM | POA: Diagnosis not present

## 2017-04-11 ENCOUNTER — Other Ambulatory Visit: Payer: Self-pay | Admitting: Family Medicine

## 2017-04-14 NOTE — Telephone Encounter (Signed)
Refilled x1. Needs office visit.

## 2017-06-15 ENCOUNTER — Other Ambulatory Visit: Payer: Self-pay | Admitting: Family Medicine

## 2017-06-23 DIAGNOSIS — I2510 Atherosclerotic heart disease of native coronary artery without angina pectoris: Secondary | ICD-10-CM | POA: Diagnosis not present

## 2017-06-23 DIAGNOSIS — I4891 Unspecified atrial fibrillation: Secondary | ICD-10-CM | POA: Diagnosis not present

## 2017-06-23 DIAGNOSIS — R0602 Shortness of breath: Secondary | ICD-10-CM | POA: Diagnosis not present

## 2017-06-23 DIAGNOSIS — E782 Mixed hyperlipidemia: Secondary | ICD-10-CM | POA: Diagnosis not present

## 2017-06-23 DIAGNOSIS — I10 Essential (primary) hypertension: Secondary | ICD-10-CM | POA: Diagnosis not present

## 2017-08-24 ENCOUNTER — Other Ambulatory Visit: Payer: Self-pay | Admitting: Family Medicine

## 2017-08-24 NOTE — Telephone Encounter (Signed)
Pharmacy requests refill on patient's pantoprazole.  Patient has not been seen since 08/2015.  Last Refilled #30, 3 refills on 05/08/2016    Refill Denied.  Patient needs to make appointment before further refills.

## 2017-09-12 ENCOUNTER — Telehealth: Payer: Self-pay

## 2017-09-12 NOTE — Telephone Encounter (Signed)
Call pt on 09-12-17 regarding Lung Screening  Left message at 11:04

## 2017-09-14 ENCOUNTER — Telehealth: Payer: Self-pay | Admitting: Nurse Practitioner

## 2017-09-16 ENCOUNTER — Telehealth: Payer: Self-pay | Admitting: *Deleted

## 2017-09-16 DIAGNOSIS — Z122 Encounter for screening for malignant neoplasm of respiratory organs: Secondary | ICD-10-CM

## 2017-09-16 NOTE — Telephone Encounter (Signed)
Patient has been notified that the annual lung cancer screening low dose CT scan is due currently or will be in the near future.  Confirmed that the patient is within the age range of 32-80, and asymptomatic, and currently exhibits no signs or symptoms of lung cancer.  Patient denies illness that would prevent curative treatment for lung cancer if found.  Verified smoking history, former smoker, quit 05/15 with 106 pk year history.  The shared decision making visit was completed on 09-25-15  Patient is agreeable for the CT scan to be scheduled.  Will call patient back with date and time of appointment.

## 2017-09-17 ENCOUNTER — Telehealth: Payer: Self-pay | Admitting: *Deleted

## 2017-09-17 NOTE — Telephone Encounter (Signed)
Patient called and notified of appt Thursday 09/24/2017 @ 2:15pm here @ [facility]. Voiced understanding.

## 2017-09-24 ENCOUNTER — Ambulatory Visit
Admission: RE | Admit: 2017-09-24 | Discharge: 2017-09-24 | Disposition: A | Discharge status: Home / Self Care | Payer: Medicare Other | Source: Ambulatory Visit | Attending: Nurse Practitioner | Admitting: Nurse Practitioner

## 2017-09-24 DIAGNOSIS — I2510 Atherosclerotic heart disease of native coronary artery without angina pectoris: Secondary | ICD-10-CM | POA: Diagnosis not present

## 2017-09-24 DIAGNOSIS — K8020 Calculus of gallbladder without cholecystitis without obstruction: Secondary | ICD-10-CM | POA: Diagnosis not present

## 2017-09-24 DIAGNOSIS — Z122 Encounter for screening for malignant neoplasm of respiratory organs: Secondary | ICD-10-CM | POA: Diagnosis not present

## 2017-09-24 DIAGNOSIS — J432 Centrilobular emphysema: Secondary | ICD-10-CM | POA: Diagnosis not present

## 2017-09-24 DIAGNOSIS — Z87891 Personal history of nicotine dependence: Secondary | ICD-10-CM | POA: Diagnosis not present

## 2017-09-24 DIAGNOSIS — J438 Other emphysema: Secondary | ICD-10-CM | POA: Insufficient documentation

## 2017-09-24 DIAGNOSIS — I700 Atherosclerosis of aorta: Secondary | ICD-10-CM | POA: Insufficient documentation

## 2017-09-28 ENCOUNTER — Encounter: Payer: Self-pay | Admitting: *Deleted

## 2017-10-01 ENCOUNTER — Ambulatory Visit (INDEPENDENT_AMBULATORY_CARE_PROVIDER_SITE_OTHER): Payer: Medicare Other | Admitting: Family Medicine

## 2017-10-01 ENCOUNTER — Encounter: Payer: Self-pay | Admitting: Family Medicine

## 2017-10-01 VITALS — BP 120/70 | HR 71 | Temp 97.9°F | Ht 66.0 in | Wt 159.0 lb

## 2017-10-01 DIAGNOSIS — N433 Hydrocele, unspecified: Secondary | ICD-10-CM | POA: Diagnosis not present

## 2017-10-01 DIAGNOSIS — E785 Hyperlipidemia, unspecified: Secondary | ICD-10-CM

## 2017-10-01 DIAGNOSIS — Z87891 Personal history of nicotine dependence: Secondary | ICD-10-CM | POA: Diagnosis not present

## 2017-10-01 DIAGNOSIS — I700 Atherosclerosis of aorta: Secondary | ICD-10-CM

## 2017-10-01 DIAGNOSIS — R319 Hematuria, unspecified: Secondary | ICD-10-CM | POA: Diagnosis not present

## 2017-10-01 DIAGNOSIS — I4891 Unspecified atrial fibrillation: Secondary | ICD-10-CM

## 2017-10-01 DIAGNOSIS — R0602 Shortness of breath: Secondary | ICD-10-CM | POA: Diagnosis not present

## 2017-10-01 DIAGNOSIS — I10 Essential (primary) hypertension: Secondary | ICD-10-CM | POA: Diagnosis not present

## 2017-10-01 LAB — POC URINALSYSI DIPSTICK (AUTOMATED)
BILIRUBIN UA: NEGATIVE
GLUCOSE UA: NEGATIVE
KETONES UA: NEGATIVE
LEUKOCYTES UA: NEGATIVE
Nitrite, UA: NEGATIVE
PH UA: 6 (ref 5.0–8.0)
Protein, UA: NEGATIVE
Spec Grav, UA: >=1.03 — AB (ref 1.010–1.025)
Urobilinogen, UA: 0.2 E.U./dL

## 2017-10-01 NOTE — Progress Notes (Signed)
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   MRN: [id number]  HPI: Victor Cortez is a 67 y.o. 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 Told to f/u with PCP. Provided recent lab work and a form to be completed.) and Dysuria (States he has a little burning/itching with urination and a strong odor.)   Last seen 08/2015 for AMW.   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  Denies significant dysuria, urgency, frequency. No abd pain, flank pain, nausea/vomiting. No weight changes.  72 PY hx, quit 06/2013.   Reviewed latest reassuring lung cancer screening CT from earlier this week.   Afib regularly followed by cards Dr Manuella Ghazi with labwork there.   Relevant past medical, surgical, family and social history reviewed and updated as indicated. Interim medical history since our last visit reviewed. Allergies and medications reviewed and updated. Outpatient Medications Prior to Visit  Medication Sig Dispense Refill  . aspirin EC 81 MG tablet Take by mouth. 1 tab by mouth in AM    . Cyanocobalamin (RA VITAMIN B-12 TR) 1000 MCG TBCR Take by mouth. One tab by mouth daily    . Glucosamine-Chondroit-Vit C-Mn (GLUCOSAMINE 1500 COMPLEX) CAPS Take 1 capsule by mouth daily.    . Ipratropium-Albuterol (COMBIVENT RESPIMAT) 20-100 MCG/ACT AERS respimat Inhale 1 puff into the lungs every 6 (six) hours as needed for wheezing. NEEDS OFFICE VISIT 4 g 0  . Multiple Vitamin (MULTI-VITAMINS) TABS Take by mouth. 1 tab by mouth daily    . Omega-3 Fatty Acids (FISH OIL) 1000 MG CAPS Take by mouth. 1 cap by mouth daily    . pantoprazole (PROTONIX) 40 MG tablet Take 1  tablet (40 mg total) by mouth daily as needed. 30 tablet 3  . sotalol (BETAPACE) 80 MG tablet Take by mouth. One tab by mouth twice a day    . pantoprazole (PROTONIX) 40 MG tablet TAKE ONE TABLET BY MOUTH EVERY DAY 30 tablet 3  . pravastatin (PRAVACHOL) 80 MG tablet Take 1 tablet (80 mg total) by mouth daily.    Marland Kitchen albuterol (PROVENTIL HFA;VENTOLIN HFA) 108 (90 Base) MCG/ACT inhaler Inhale 2 puffs into the lungs every 6 (six) hours as needed for wheezing or shortness of breath. 1 Inhaler 3  . brimonidine (ALPHAGAN) 0.15 % ophthalmic solution   2  . fluticasone (FLOVENT HFA) 110 MCG/ACT inhaler Inhale 2 puffs into the lungs 2 (two) times daily. 1 Inhaler 1  . Terbinafine HCl POWD Powder in shoes daily 500 g 1   No facility-administered medications prior to visit.      Per HPI unless specifically indicated in ROS section below Review of Systems     Objective:    BP 120/70 (BP Location: Left Arm, Patient Position: Sitting, Cuff Size: Normal)   Pulse 71   Temp 97.9 F (36.6 C) (Oral)   Ht 5\' 6"  (1.676 m)   Wt 159 lb (72.1 kg)   SpO2 95%   BMI 25.66 kg/m   Wt Readings from Last 3 Encounters:  10/01/17  159 lb (72.1 kg)  09/24/17 158 lb (71.7 kg)  09/24/16 155 lb (70.3 kg)    Physical Exam  Constitutional: He appears well-developed and well-nourished. No distress.  HENT:  Mouth/Throat: Oropharynx is clear and moist. No oropharyngeal exudate.  Cardiovascular: Normal rate, regular rhythm and normal heart sounds.  No murmur heard. Pulmonary/Chest: Effort normal and breath sounds normal. No respiratory distress. He has no wheezes. He has no rales.  Abdominal: Soft. Bowel sounds are normal. He exhibits no distension and no mass. There is no tenderness. There is no rebound, no guarding and no CVA tenderness. No hernia. Hernia confirmed negative in the right inguinal area and confirmed negative in the left inguinal area.  Genitourinary: Penis normal. Right testis shows mass (large R  hydrocele). Right testis is descended. Left testis is descended.  Musculoskeletal: He exhibits no edema.  Skin: No rash noted.  Psychiatric: He has a normal mood and affect.  Nursing note and vitals reviewed.  Results for orders placed or performed in visit on 10/01/17  POCT Urinalysis Dipstick (Automated)  Result Value Ref Range   Color, UA yellow    Clarity, UA cloudy    Glucose, UA Negative Negative   Bilirubin, UA negative    Ketones, UA negative    Spec Grav, UA >=1.030 (A) 1.010 - 1.025   Blood, UA 3+    pH, UA 6.0 5.0 - 8.0   Protein, UA Negative Negative   Urobilinogen, UA 0.2 0.2 or 1.0 E.U./dL   Nitrite, UA negative    Leukocytes, UA Negative Negative   Lab Results  Component Value Date   CREATININE 1.25 08/31/2015   BUN 14 08/31/2015   NA 140 08/31/2015   K 4.6 08/31/2015   CL 104 08/31/2015   CO2 31 08/31/2015       Assessment & Plan:   Problem List Items Addressed This Visit    Thoracic aortic atherosclerosis ([place])    On aspirin and statin.       Relevant Medications   pravastatin (PRAVACHOL) 80 MG tablet   Right hydrocele    Chronic, large. Has seen urology, deferred surgery.       Lone atrial fibrillation with RVR    Sees cardiologist Dr Humphrey Rolls on sotalol and aspirin.       Relevant Medications   pravastatin (PRAVACHOL) 80 MG tablet   Hyperlipidemia    On pravastatin through cards.      Relevant Medications   pravastatin (PRAVACHOL) 80 MG tablet   Hematuria - Primary    New, incidental finding at DOT physical.  Strong smoking history Discussed need for further eval - will refer to urology. Pt agrees. Has previously seen [facility]. I did encourage increased water given concentrated urine.       Relevant Orders   POCT Urinalysis Dipstick (Automated) (Completed)   Ambulatory referral to Urology   Urine Culture   Ex-smoker    Remains abstinent          No orders of the defined types were placed in this encounter.  Orders  Placed This Encounter  Procedures  . Urine Culture  . Ambulatory referral to Urology    Referral Priority:   Routine    Referral Type:   Consultation    Referral Reason:   Specialty Services Required    Requested Specialty:   Urology    Number of Visits Requested:   1  . POCT Urinalysis Dipstick (Automated)    Follow up plan: No follow-ups on  file.  Ria Bush, MD

## 2017-10-01 NOTE — Assessment & Plan Note (Signed)
Chronic, large. Has seen urology, deferred surgery.

## 2017-10-01 NOTE — Patient Instructions (Signed)
For blood in urine - we will refer you to urologist - see our referral coordinators on your way out today.

## 2017-10-01 NOTE — Assessment & Plan Note (Addendum)
New, incidental finding at DOT physical.  Strong smoking history Discussed need for further eval - will refer to urology. Pt agrees. Has previously seen [facility]. I did encourage increased water given concentrated urine.

## 2017-10-01 NOTE — Assessment & Plan Note (Signed)
On pravastatin through cards.

## 2017-10-01 NOTE — Assessment & Plan Note (Signed)
Remains abstinent 

## 2017-10-01 NOTE — Assessment & Plan Note (Signed)
Sees cardiologist Dr Humphrey Rolls on sotalol and aspirin.

## 2017-10-01 NOTE — Assessment & Plan Note (Signed)
On aspirin and statin °

## 2017-10-02 LAB — URINE CULTURE
MICRO NUMBER: 91035834
SPECIMEN QUALITY:: ADEQUATE

## 2017-10-04 DIAGNOSIS — K8020 Calculus of gallbladder without cholecystitis without obstruction: Secondary | ICD-10-CM

## 2017-10-04 HISTORY — DX: Calculus of gallbladder without cholecystitis without obstruction: K80.20

## 2017-10-08 DIAGNOSIS — R0602 Shortness of breath: Secondary | ICD-10-CM | POA: Diagnosis not present

## 2017-10-08 DIAGNOSIS — I10 Essential (primary) hypertension: Secondary | ICD-10-CM | POA: Diagnosis not present

## 2017-10-08 DIAGNOSIS — I4891 Unspecified atrial fibrillation: Secondary | ICD-10-CM | POA: Diagnosis not present

## 2017-10-20 ENCOUNTER — Ambulatory Visit (INDEPENDENT_AMBULATORY_CARE_PROVIDER_SITE_OTHER): Payer: Medicare Other | Admitting: Urology

## 2017-10-20 ENCOUNTER — Encounter: Payer: Self-pay | Admitting: Urology

## 2017-10-20 VITALS — BP 166/90 | HR 59 | Ht 66.0 in | Wt 158.2 lb

## 2017-10-20 DIAGNOSIS — R3129 Other microscopic hematuria: Secondary | ICD-10-CM | POA: Diagnosis not present

## 2017-10-20 DIAGNOSIS — N529 Male erectile dysfunction, unspecified: Secondary | ICD-10-CM

## 2017-10-20 LAB — URINALYSIS, COMPLETE
BILIRUBIN UA: NEGATIVE
Glucose, UA: NEGATIVE
KETONES UA: NEGATIVE
Leukocytes, UA: NEGATIVE
NITRITE UA: NEGATIVE
PH UA: 6 (ref 5.0–7.5)
Protein, UA: NEGATIVE
SPEC GRAV UA: 1.025 (ref 1.005–1.030)
Urobilinogen, Ur: 0.2 mg/dL (ref 0.2–1.0)

## 2017-10-20 LAB — MICROSCOPIC EXAMINATION
EPITHELIAL CELLS (NON RENAL): NONE SEEN /HPF (ref 0–10)
WBC, UA: NONE SEEN /hpf (ref 0–5)

## 2017-10-20 NOTE — Patient Instructions (Signed)
Hematuria, Adult Hematuria is blood in your urine. It can be caused by a bladder infection, kidney infection, prostate infection, kidney stone, or cancer of your urinary tract. Infections can usually be treated with medicine, and a kidney stone usually will pass through your urine. If neither of these is the cause of your hematuria, further workup to find out the reason may be needed. It is very important that you tell your health care provider about any blood you see in your urine, even if the blood stops without treatment or happens without causing pain. Blood in your urine that happens and then stops and then happens again can be a symptom of a very serious condition. Also, pain is not a symptom in the initial stages of many urinary cancers. Follow these instructions at home:  Drink lots of fluid, 3-4 quarts a day. If you have been diagnosed with an infection, cranberry juice is especially recommended, in addition to large amounts of water.  Avoid caffeine, tea, and carbonated beverages because they tend to irritate the bladder.  Avoid alcohol because it may irritate the prostate.  Take all medicines as directed by your health care provider.  If you were prescribed an antibiotic medicine, finish it all even if you start to feel better.  If you have been diagnosed with a kidney stone, follow your health care provider's instructions regarding straining your urine to catch the stone.  Empty your bladder often. Avoid holding urine for long periods of time.  After a bowel movement, women should cleanse front to back. Use each tissue only once.  Empty your bladder before and after sexual intercourse if you are a male. Contact a health care provider if:  You develop back pain.  You have a fever.  You have a feeling of sickness in your stomach (nausea) or vomiting.  Your symptoms are not better in 3 days. Return sooner if you are getting worse. Get help right away if:  You develop  severe vomiting and are unable to keep the medicine down.  You develop severe back or abdominal pain despite taking your medicines.  You begin passing a large amount of blood or clots in your urine.  You feel extremely weak or faint, or you pass out. This information is not intended to replace advice given to you by your health care provider. Make sure you discuss any questions you have with your health care provider. Document Released: 01/20/2005 Document Revised: 06/28/2015 Document Reviewed: 09/20/2012 Elsevier Interactive Patient Education  2017 Elsevier Inc.  CT Scan A CT scan (computed tomography scan) is an imaging scan. It uses X-rays and a computer to make detailed pictures of different areas inside the body. A CT scan can give more information than a regular X-ray exam. A CT scan provides data about internal organs, soft tissue structures, blood vessels, and bones. In this procedure, the pictures will be taken in a large machine that has an opening (CT scanner). Tell a health care provider about:  Any allergies you have.  All medicines you are taking, including vitamins, herbs, eye drops, creams, and over-the-counter medicines.  Any blood disorders you have.  Any surgeries you have had.  Any medical conditions you have.  Whether you are pregnant or may be pregnant. What are the risks? Generally, this is a safe procedure. However, problems may occur, including:  An allergic reaction to dyes.  Development of cancer from excessive exposure to radiation from multiple CT scans. This is rare.  What happens before   the procedure? Staying hydrated Follow instructions from your health care provider about hydration, which may include:  Up to 2 hours before the procedure - you may continue to drink clear liquids, such as water, clear fruit juice, black coffee, and plain tea.  Eating and drinking restrictions Follow instructions from your health care provider about eating and  drinking, which may include:  24 hours before the procedure - stop drinking caffeinated beverages, such as energy drinks, tea, soda, coffee, and hot chocolate.  8 hours before the procedure - stop eating heavy meals or foods such as meat, fried foods, or fatty foods.  6 hours before the procedure - stop eating light meals or foods, such as toast or cereal.  6 hours before the procedure - stop drinking milk or drinks that contain milk.  2 hours before the procedure - stop drinking clear liquids.  General instructions  Remove any jewelry.  Ask your health care provider about changing or stopping your regular medicines. This is especially important if you are taking diabetes medicines or blood thinners. What happens during the procedure?  You will lie on a table with your arms above your head.  An IV tube may be inserted into one of your veins.  The contrast dye may be injected into the IV tube. You may feel warm or have a metallic taste in your mouth.  The table you will be lying on will move into the CT scanner.  You will be able to see, hear, and talk to the person running the machine while you are in it. Follow that person's instructions.  The CT scanner will move around you to take pictures. Do not move while it is scanning. Staying still helps the scanner to get a good image.  When the best possible pictures have been taken, the machine will be turned off. The table will be moved out of the machine.  The IV tube will be removed. The procedure may vary among health care providers and hospitals. What happens after the procedure?  It is up to you to get the results of your procedure. Ask your health care provider, or the department that is doing the procedure, when your results will be ready. Summary  A CT scan is an imaging scan.  A CT scan uses X-rays and a computer to make detailed pictures of different areas of your body.  Follow instructions from your health care  provider about eating and drinking before the procedure.  You will be able to see, hear, and talk to the person running the machine while you are in it. Follow that person's instructions. This information is not intended to replace advice given to you by your health care provider. Make sure you discuss any questions you have with your health care provider. Document Released: 02/28/2004 Document Revised: 02/23/2016 Document Reviewed: 02/23/2016 Elsevier Interactive Patient Education  2018 Elsevier Inc.  Cystoscopy Cystoscopy is a procedure that is used to help diagnose and sometimes treat conditions that affect that lower urinary tract. The lower urinary tract includes the bladder and the tube that drains urine from the bladder out of the body (urethra). Cystoscopy is performed with a thin, tube-shaped instrument with a light and camera at the end (cystoscope). The cystoscope may be hard (rigid) or flexible, depending on the goal of the procedure.The cystoscope is inserted through the urethra, into the bladder. Cystoscopy may be recommended if you have:  Urinary tractinfections that keep coming back (recurring).  Blood in the urine (hematuria).    Loss of bladder control (urinary incontinence) or an overactive bladder.  Unusual cells found in a urine sample.  A blockage in the urethra.  Painful urination.  An abnormality in the bladder found during an intravenous pyelogram (IVP) or CT scan.  Cystoscopy may also be done to remove a sample of tissue to be examined under a microscope (biopsy). Tell a health care provider about:  Any allergies you have.  All medicines you are taking, including vitamins, herbs, eye drops, creams, and over-the-counter medicines.  Any problems you or family members have had with anesthetic medicines.  Any blood disorders you have.  Any surgeries you have had.  Any medical conditions you have.  Whether you are pregnant or may be pregnant. What are the  risks? Generally, this is a safe procedure. However, problems may occur, including:  Infection.  Bleeding.  Allergic reactions to medicines.  Damage to other structures or organs.  What happens before the procedure?  Ask your health care provider about: ? Changing or stopping your regular medicines. This is especially important if you are taking diabetes medicines or blood thinners. ? Taking medicines such as aspirin and ibuprofen. These medicines can thin your blood. Do not take these medicines before your procedure if your health care provider instructs you not to.  Follow instructions from your health care provider about eating or drinking restrictions.  You may be given antibiotic medicine to help prevent infection.  You may have an exam or testing, such as X-rays of the bladder, urethra, or kidneys.  You may have urine tests to check for signs of infection.  Plan to have someone take you home after the procedure. What happens during the procedure?  To reduce your risk of infection,your health care team will wash or sanitize their hands.  You will be given one or more of the following: ? A medicine to help you relax (sedative). ? A medicine to numb the area (local anesthetic).  The area around the opening of your urethra will be cleaned.  The cystoscope will be passed through your urethra into your bladder.  Germ-free (sterile)fluid will flow through the cystoscope to fill your bladder. The fluid will stretch your bladder so that your surgeon can clearly examine your bladder walls.  The cystoscope will be removed and your bladder will be emptied. The procedure may vary among health care providers and hospitals. What happens after the procedure?  You may have some soreness or pain in your abdomen and urethra. Medicines will be available to help you.  You may have some blood in your urine.  Do not drive for 24 hours if you received a sedative. This information is  not intended to replace advice given to you by your health care provider. Make sure you discuss any questions you have with your health care provider. Document Released: 01/18/2000 Document Revised: 05/31/2015 Document Reviewed: 12/07/2014 Elsevier Interactive Patient Education  2018 Elsevier Inc.  

## 2017-10-20 NOTE — Progress Notes (Signed)
10/20/2017 1:42 PM   Norman Clay 07/27/1950 [id number]  Referring provider: Ria Bush, MD [address] [place], [place] [address]  Chief Complaint  Patient presents with  . Hematuria    HPI: Patient is a 67 -year-old Caucasian male who presents today as a referral from Dr. Ria Bush for positive dip for blood on urine dip stick.      He has seen his urine dark a few times last month.      He does not have a prior history of recurrent urinary tract infections, nephrolithiasis, trauma to the genitourinary tract, BPH or malignancies of the genitourinary tract.   His sister has had stones.  He does not have a family history of malignancies of the genitourinary tract or hematuria.   Today, he is not having symptoms of frequent urination, urgency, dysuria, nocturia, incontinence, hesitancy, intermittency, straining to urinate or a weak urinary stream.  Patient denies any gross hematuria, dysuria or suprapubic/flank pain.  Patient denies any fevers, chills, nausea or vomiting.  His UA today demonstrates 3-10 RBC's.    He is a former smoker, with a 2 ppd history.  Quit 4 years ago.   He has not worked with Sports [employer], trichloroethylene, etc.   He has HTN.     He was seen by [place] in 2016 for ED and a hydrocele.  He does not want the hydrocele treated.  He failed PDE5i.    PMH: Past Medical History:  Diagnosis Date  . Aortic atherosclerosis ([place])    by CT  . Arthritis   . Atrial fibrillation with RVR ([place]) 06/2013   during hospitalization  . Basal cell carcinoma of nose 1998  . CAD (coronary artery disease)    by CT  . COPD (chronic obstructive pulmonary disease) ([place])   . ED (erectile dysfunction)   . Ex-smoker   . GERD (gastroesophageal reflux disease)   . Glaucoma    Dr. Matilde Sprang  . Hepatic steatosis    by CT  . History of chicken pox   . History of pneumonia 06/2013   with sepsis and [facility] hospitalization  . Hyperlipidemia   . Rosacea   .  Seborrheic dermatitis     Surgical History: Past Surgical History:  Procedure Laterality Date  . ROTATOR CUFF REPAIR  2016    Home Medications:  Allergies as of 10/20/2017   No Known Allergies     Medication List        Accurate as of 10/20/17  1:42 PM. Always use your most recent med list.          aspirin EC 81 MG tablet Take by mouth. 1 tab by mouth in AM   Fish Oil 1000 MG Caps Take by mouth. 1 cap by mouth daily   GLUCOSAMINE 1500 COMPLEX Caps Take 1 capsule by mouth daily.   Ipratropium-Albuterol 20-100 MCG/ACT Aers respimat Commonly known as:  COMBIVENT Inhale 1 puff into the lungs every 6 (six) hours as needed for wheezing. NEEDS OFFICE VISIT   latanoprost 0.005 % ophthalmic solution Commonly known as:  XALATAN Place 1 drop into both eyes at bedtime.   MULTI-VITAMINS Tabs Take by mouth. 1 tab by mouth daily   pantoprazole 40 MG tablet Commonly known as:  PROTONIX Take 1 tablet (40 mg total) by mouth daily as needed.   pravastatin 80 MG tablet Commonly known as:  PRAVACHOL Take 1 tablet (80 mg total) by mouth daily.   RA VITAMIN B-12 TR  1000 MCG Tbcr Generic drug:  Cyanocobalamin Take by mouth. One tab by mouth daily   sotalol 80 MG tablet Commonly known as:  BETAPACE Take by mouth. One tab by mouth twice a day       Allergies: No Known Allergies  Family History: Family History  Problem Relation Age of Onset  . COPD Father   . Cancer Father        lung (smoker)  . COPD Mother   . CAD Neg Hx   . Stroke Neg Hx   . Bladder Cancer Neg Hx   . Prostate cancer Neg Hx     Social History:  reports that he quit smoking about 4 years ago. His smoking use included cigarettes. He has a 106.00 pack-year smoking history. He has never used smokeless tobacco. He reports that he does not drink alcohol or use drugs.  ROS: UROLOGY Frequent Urination?: No Hard to postpone urination?: No Burning/pain with urination?: No Get up at night to urinate?:  No Leakage of urine?: No Urine stream starts and stops?: No Trouble starting stream?: No Do you have to strain to urinate?: No Blood in urine?: Yes Urinary tract infection?: No Sexually transmitted disease?: No Injury to kidneys or bladder?: No Painful intercourse?: No Weak stream?: No Erection problems?: Yes Penile pain?: No  Gastrointestinal Nausea?: No Vomiting?: No Indigestion/heartburn?: Yes Diarrhea?: No Constipation?: No  Constitutional Fever: No Night sweats?: No Weight loss?: No Fatigue?: No  Skin Skin rash/lesions?: Yes Itching?: No  Eyes Blurred vision?: Yes Double vision?: No  Ears/Nose/Throat Sore throat?: No Sinus problems?: No  Hematologic/Lymphatic Swollen glands?: No Easy bruising?: No  Cardiovascular Leg swelling?: No Chest pain?: No  Respiratory Cough?: No Shortness of breath?: Yes  Endocrine Excessive thirst?: No  Musculoskeletal Back pain?: No Joint pain?: No  Neurological Headaches?: No Dizziness?: No  Psychologic Depression?: No Anxiety?: No  Physical Exam: BP (!) 166/90 (BP Location: Left Arm, Patient Position: Sitting, Cuff Size: Normal)   Pulse (!) 59   Ht 5\' 6"  (1.676 m)   Wt 158 lb 3.2 oz (71.8 kg)   BMI 25.53 kg/m   Constitutional:  Well nourished. Alert and oriented, No acute distress. HEENT: [place] AT, moist mucus membranes.  Trachea midline, no masses. Cardiovascular: No clubbing, cyanosis, or edema. Respiratory: Normal respiratory effort, no increased work of breathing. GI: Abdomen is soft, non tender, non distended, no abdominal masses. Liver and spleen not palpable.  No hernias appreciated.  Stool sample for occult testing is not indicated.   GU: No CVA tenderness.  No bladder fullness or masses.  Patient with uncircumcised phallus. Foreskin easily retracted Urethral meatus is patent.  No penile discharge. No penile lesions or rashes. Scrotum without lesions, cysts, rashes and/or edema.  Large right  hydrocele.  Left testicle is located scrotally. No masses are appreciated in the left testicle. Left epididymis is normal. Rectal: Patient with  normal sphincter tone. Anus and perineum without scarring or rashes. No rectal masses are appreciated. Prostate is approximately 50 grams, no nodules are appreciated. Seminal vesicles are normal. Skin: No rashes, bruises or suspicious lesions. Lymph: No cervical or inguinal adenopathy. Neurologic: Grossly intact, no focal deficits, moving all 4 extremities. Psychiatric: Normal mood and affect.  Laboratory Data: Lab Results  Component Value Date   WBC 13.7 (H) 06/07/2013   HGB 12.6 (L) 06/07/2013   HCT 38.8 (L) 06/07/2013   MCV 87 06/07/2013   PLT 249 06/07/2013    Lab Results  Component Value Date  CREATININE 1.25 08/31/2015    No results found for: PSA  No results found for: TESTOSTERONE  Lab Results  Component Value Date   HGBA1C 6.3 08/31/2015    Lab Results  Component Value Date   TSH 1.39 08/31/2015       Component Value Date/Time   CHOL 224 (H) 08/31/2015 0900   HDL 48.30 08/31/2015 0900   CHOLHDL 5 08/31/2015 0900   VLDL 21.2 08/31/2015 0900   LDLCALC 154 (H) 08/31/2015 0900    Lab Results  Component Value Date   AST 30 08/31/2015   Lab Results  Component Value Date   ALT 44 08/31/2015   No components found for: ALKALINEPHOPHATASE No components found for: BILIRUBINTOTAL  No results found for: ESTRADIOL   Urinalysis 3-10 RBC's.  See Epic.  I have reviewed the labs.  Assessment & Plan:    1. Microscopic hematuria Explained to the patient that there are a number of causes that can be associated with blood in the urine, such as stones,  BPH, UTI's, damage to the urinary tract and/or cancer. At this time, I felt that the patient warranted further urologic evaluation.   The AUA guidelines state that a CT urogram is the preferred imaging study to evaluate hematuria. I explained to the patient that a  contrast material will be injected into a vein and that in rare instances, an allergic reaction can result and may even life threatening   The patient denies any allergies to contrast, iodine and/or seafood and is not taking metformin. Following the imaging study,  I've recommended a cystoscopy. I described how this is performed, typically in an office setting with a flexible cystoscope. We described the risks, benefits, and possible side effects, the most common of which is a minor amount of blood in the urine and/or burning which usually resolves in 24 to 48 hours.   The patient had the opportunity to ask questions which were answered. Based upon this discussion, the patient is willing to proceed. Therefore, I've ordered: a CT Urogram and cystoscopy.   - The patient will return following all of the above for discussion of the results.   - UA   - Urine culture   - BUN + creatinine    2. ED Failed PDE5i Will have further discussion once hematuria work up is complete    Return for CT Urogram report and cystoscopy.  These notes generated with voice recognition software. I apologize for typographical errors.  Zara Council, PA-C  [facility] [address] [place]  [place], [place] [address] [phone]

## 2017-10-21 LAB — BUN+CREAT
BUN/Creatinine Ratio: 14 (ref 10–24)
BUN: 16 mg/dL (ref 8–27)
CREATININE: 1.16 mg/dL (ref 0.76–1.27)
GFR, EST AFRICAN AMERICAN: 74 mL/min/{1.73_m2} (ref >59)
GFR, EST NON AFRICAN AMERICAN: 64 mL/min/{1.73_m2} (ref >59)

## 2017-10-23 ENCOUNTER — Telehealth: Payer: Self-pay | Admitting: *Deleted

## 2017-10-23 LAB — CULTURE, URINE COMPREHENSIVE

## 2017-10-23 MED ORDER — AMOXICILLIN-POT CLAVULANATE 875-125 MG PO TABS: 1.0000 | ORAL_TABLET | Freq: Two times a day (BID) | ORAL | 0 refills | Status: AC

## 2017-10-23 NOTE — Telephone Encounter (Signed)
Notified patient as instructed, patient pleased. Discussed follow-up appointments, patient agrees Rx sent to [facility]

## 2017-10-23 NOTE — Telephone Encounter (Signed)
-----   Message from Nori Riis, PA-C sent at 10/23/2017  6:22 AM EDT ----- Please let Mr. Aboud know that his urine culture was positive for an infection.  He needs to start Augmentin 875/125, twice daily for seven days.

## 2017-10-26 DIAGNOSIS — Z23 Encounter for immunization: Secondary | ICD-10-CM | POA: Diagnosis not present

## 2017-11-01 ENCOUNTER — Encounter: Payer: Self-pay | Admitting: Family Medicine

## 2017-11-02 ENCOUNTER — Ambulatory Visit
Admission: RE | Admit: 2017-11-02 | Discharge: 2017-11-02 | Disposition: A | Discharge status: Home / Self Care | Payer: Medicare Other | Source: Ambulatory Visit | Attending: Urology | Admitting: Urology

## 2017-11-02 DIAGNOSIS — K4020 Bilateral inguinal hernia, without obstruction or gangrene, not specified as recurrent: Secondary | ICD-10-CM | POA: Diagnosis not present

## 2017-11-02 DIAGNOSIS — N200 Calculus of kidney: Secondary | ICD-10-CM | POA: Insufficient documentation

## 2017-11-02 DIAGNOSIS — N132 Hydronephrosis with renal and ureteral calculous obstruction: Secondary | ICD-10-CM | POA: Insufficient documentation

## 2017-11-02 DIAGNOSIS — N433 Hydrocele, unspecified: Secondary | ICD-10-CM | POA: Insufficient documentation

## 2017-11-02 DIAGNOSIS — R3129 Other microscopic hematuria: Secondary | ICD-10-CM

## 2017-11-02 MED ORDER — IOPAMIDOL (ISOVUE-300) INJECTION 61%: 125.0000 mL | Freq: Once | INTRAVENOUS | Status: DC | PRN

## 2017-11-05 ENCOUNTER — Other Ambulatory Visit: Payer: Self-pay

## 2017-11-05 ENCOUNTER — Encounter: Payer: Self-pay | Admitting: Urology

## 2017-11-05 ENCOUNTER — Ambulatory Visit (INDEPENDENT_AMBULATORY_CARE_PROVIDER_SITE_OTHER): Payer: Medicare Other | Admitting: Urology

## 2017-11-05 VITALS — BP 126/79 | HR 74 | Ht 66.0 in | Wt 158.0 lb

## 2017-11-05 DIAGNOSIS — N201 Calculus of ureter: Secondary | ICD-10-CM

## 2017-11-05 DIAGNOSIS — R3129 Other microscopic hematuria: Secondary | ICD-10-CM | POA: Diagnosis not present

## 2017-11-05 LAB — MICROSCOPIC EXAMINATION
Bacteria, UA: NONE SEEN
WBC, UA: NONE SEEN /hpf (ref 0–5)

## 2017-11-05 LAB — URINALYSIS, COMPLETE
BILIRUBIN UA: NEGATIVE
Glucose, UA: NEGATIVE
Leukocytes, UA: NEGATIVE
NITRITE UA: NEGATIVE
SPEC GRAV UA: 1.025 (ref 1.005–1.030)
UUROB: 0.2 mg/dL (ref 0.2–1.0)
pH, UA: 6 (ref 5.0–7.5)

## 2017-11-05 NOTE — Progress Notes (Signed)
   11/05/2017 3:31 PM   Victor Cortez [birth year] [id number]  Reason for visit: Follow up microscopic hematuria  HPI: I saw Victor Cortez in urology clinic today for follow-up of microscopic hematuria.  He was previously seen by our PA Zara Council, and set up for cystoscopy today.  On his CT urogram, he has a 7 mm left distal ureteral stone.  He is completely asymptomatic with no flank pain, nausea, or vomiting.  He denies any prior history of stones.  He denies fevers or chills.  He has an extensive smoking history with COPD and emphysema, but is otherwise healthy.   ROS: Please see flowsheet from today's date for complete review of systems.  Physical Exam: BP 126/79   Pulse 74   Ht 5\' 6"  (1.676 m)   Wt 158 lb (71.7 kg)   BMI 25.50 kg/m    Constitutional:  Alert and oriented, No acute distress. Respiratory: Normal respiratory effort, no increased work of breathing. GI: Abdomen is soft, nontender, nondistended, no abdominal masses GU: No CVA tenderness Skin: No rashes, bruises or suspicious lesions. Neurologic: Grossly intact, no focal deficits, moving all 4 extremities. Psychiatric: Normal mood and affect  Pertinent Imaging:  I have personally reviewed the CT urogram dated 11/02/2017.  There is a left distal 7 mm ureteral calculus causing mild left-sided hydroureteronephrosis.  Assessment & Plan:   In summary, Victor Cortez is a 67 year old male with a completely asymptomatic left 7 mm distal ureteral stone found on microscopic hematuria work-up.  His renal function is normal.  We discussed various treatment options for urolithiasis including observation with or without medical expulsive therapy, shockwave lithotripsy (SWL), ureteroscopy and laser lithotripsy with stent placement, and percutaneous nephrolithotomy.  We discussed that management is based on stone size, location, density, patient co-morbidities, and patient preference.   Stones <60mm in size have a >80%  spontaneous passage rate. Data surrounding the use of tamsulosin for medical expulsive therapy is controversial, but meta analyses suggests it is most efficacious for distal stones between 5-65mm in size. Possible side effects include dizziness/lightheadedness, and retrograde ejaculation.  SWL has a lower stone free rate in a single procedure, but also a lower complication rate compared to ureteroscopy and avoids a stent and associated stent related symptoms. Possible complications include renal hematoma, steinstrasse, and need for additional treatment.  Ureteroscopy with laser lithotripsy and stent placement has a higher stone free rate than SWL in a single procedure, however increased complication rate including possible infection, ureteral injury, bleeding, and stent related morbidity. Common stent related symptoms include dysuria, urgency/frequency, and flank pain.  PCNL is the favored treatment for stones >2cm. It involves a small incision in the flank, with complete fragmentation of stones and removal. It has the highest stone free rate, but also the highest complication rate. Possible complications include bleeding, infection/sepsis, injury to surrounding organs including the pleura, and collecting system injury.   After an extensive discussion of the risks and benefits of the above treatment options, the patient would like to proceed with left ureteroscopy.  Schedule left ureteroscopy, laser lithotripsy, stent placement.  KUB morning of surgery.  B[place]ley Co, [facility] [address], [place], [place] [address] [phone]

## 2017-11-06 ENCOUNTER — Other Ambulatory Visit: Payer: Self-pay | Admitting: Radiology

## 2017-11-06 DIAGNOSIS — N201 Calculus of ureter: Secondary | ICD-10-CM

## 2017-11-10 ENCOUNTER — Telehealth: Payer: Self-pay | Admitting: Radiology

## 2017-11-10 NOTE — Telephone Encounter (Signed)
LMOM to notify patient of pre-admission testing appointment scheduled 11/26/2017 @ 9:00.

## 2017-11-12 ENCOUNTER — Telehealth: Payer: Self-pay

## 2017-11-12 NOTE — Telephone Encounter (Signed)
Copied from [place] [id number]. Topic: General - Other >> Nov 12, 2017 10:05 AM Yvette Rack wrote: Reason for CRM: Amy from [place] (P) [phone] calling wanting to know if pt surgical clearance form has been faxed back (F) [phone] she faxed form on 10-06-17 to Dr Danise Mina

## 2017-11-12 NOTE — Telephone Encounter (Signed)
I did get it. plz schedule pt for preop eval - will need EKG.  I will hold on to form. plz notify Amy at uro of date of preop eval here.

## 2017-11-12 NOTE — Telephone Encounter (Addendum)
Patient advised. Appointment scheduled. Amy at [facility] advised.

## 2017-11-12 NOTE — Telephone Encounter (Signed)
Dr. Darnell Level, do you have this pt's pre-op clearance form?

## 2017-11-12 NOTE — Telephone Encounter (Signed)
Phones are down in the office. Cannot contact pt at this time.  L[place]ene, will you please try to contact this pt for me to schedule pre-op OV.  Then call Amy at [facility] to let her know the appt date? Thanks in advance!

## 2017-11-14 ENCOUNTER — Encounter: Payer: Self-pay | Admitting: Family Medicine

## 2017-11-14 NOTE — Progress Notes (Signed)
BP 126/70 (BP Location: Left Arm, Patient Position: Sitting, Cuff Size: Normal)   Pulse 76   Temp 97.9 F (36.6 C) (Oral)   Ht 5\' 6"  (1.676 m)   Wt 159 lb (72.1 kg)   SpO2 96%   BMI 25.66 kg/m    CC: preop eval Subjective:    Patient ID: Victor Cortez, male    DOB: Jan 20, 1951, 67 y.o.   MRN: [id number]  HPI: Victor Cortez is a 67 y.o. male presenting on 11/16/2017 for Pre-op Exam (Here for pre-op eval. Surgery scheduled in 11/26/17. 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   Results reviewed with patient: Recent CT scan 10/2017 also showed abnormal appearance of the gallbladder - wall thickened and moderately contracted around gallstones ?tumefactive sludge but with mildly irregular gallbladder wall margins. Radiology recommends MRI abd with/without contrast for further evaluation. CT scan also showed advanced atherosclerotic calcifications of aorta and iliac arteries without aneurysm. Also showed bilat hernias and large R sided scrotal hydrocele. CT scan also showed small bilateral renal calculi.   Denies RUQ abd pain, nausea/vomiting, bloating or indigestion.  Denies chest pain or tightness or significant exertional dyspnea   Cardiologist is Dr Humphrey Rolls - last saw 2-3 wks ago. Sees for h/o afib on sotalol.  COPD - requests combivent refill. Some chronic dyspnea from this, unchanged.  Latest lung cancer screening CT scan reviewed - reassuring. GERD - rare PPI use.  H/o afib on sotalol.   Has tolerated GETA well in the past - last 2016 for shoulder repair.   Relevant past medical, surgical, family and social history reviewed and updated as indicated. Interim medical history since our last visit reviewed. Allergies and medications reviewed and updated. Outpatient Medications  Prior to Visit  Medication Sig Dispense Refill  . aspirin EC 81 MG tablet Take by mouth. 1 tab by mouth in AM    . Cyanocobalamin (RA VITAMIN B-12 TR) 1000 MCG TBCR Take by mouth. One tab by mouth daily    . Glucosamine-Chondroit-Vit C-Mn (GLUCOSAMINE 1500 COMPLEX) CAPS Take 1 capsule by mouth daily.    Marland Kitchen latanoprost (XALATAN) 0.005 % ophthalmic solution Place 1 drop into both eyes at bedtime.    . Multiple Vitamin (MULTI-VITAMINS) TABS Take by mouth. 1 tab by mouth daily    . Omega-3 Fatty Acids (FISH OIL) 1000 MG CAPS Take by mouth. 1 cap by mouth daily    . pantoprazole (PROTONIX) 40 MG tablet Take 1 tablet (40 mg total) by mouth daily as needed. 30 tablet 3  . pravastatin (PRAVACHOL) 80 MG tablet Take 1 tablet (80 mg total) by mouth daily.    . sotalol (BETAPACE) 80 MG tablet Take by mouth. One tab by mouth twice a day    . Ipratropium-Albuterol (COMBIVENT RESPIMAT) 20-100 MCG/ACT AERS respimat Inhale 1 puff into the lungs every 6 (six) hours as needed for wheezing. NEEDS OFFICE VISIT 4 g 0   No facility-administered medications prior to visit.      Per HPI unless specifically indicated in ROS section below Review of Systems     Objective:    BP 126/70 (BP Location: Left Arm, Patient Position: Sitting, Cuff Size: Normal)   Pulse 76   Temp 97.9 F (36.6 C) (Oral)   Ht 5\' 6"  (1.676 m)   Wt 159 lb (72.1 kg)  SpO2 96%   BMI 25.66 kg/m   Wt Readings from Last 3 Encounters:  11/16/17 159 lb (72.1 kg)  11/05/17 158 lb (71.7 kg)  10/20/17 158 lb 3.2 oz (71.8 kg)    Physical Exam  Constitutional: He appears well-developed and well-nourished. No distress.  HENT:  Head: Normocephalic and atraumatic.  Mouth/Throat: Oropharynx is clear and moist. No oropharyngeal exudate.  Cardiovascular: Normal rate, regular rhythm and normal heart sounds.  No murmur heard. Pulmonary/Chest: Effort normal and breath sounds normal. No respiratory distress. He has no wheezes. He has no rales.    Musculoskeletal: He exhibits no edema.  Skin: Rash noted.  Dry skin with fine scale throughout face  Psychiatric: He has a normal mood and affect.  Nursing note and vitals reviewed.  EKG sinus bradycardia, normal axis, intervals, no acute ST/T changes.    Assessment & Plan:   Problem List Items Addressed This Visit    Renal insufficiency    Update kidney function.       Rash of face    Anticipate seborrheic dermatitis - suggested limited cortisone-10 OTC and lotrimin. Update with effect.      Pre-op evaluation - Primary    Low risk procedure, low-mod risk patient (h/o afib).  RCRI = 0.  Check labwork as well as EKG today.  Likely adequately low risk to proceed with surgery without further testing/intervention.       Relevant Orders   Comprehensive metabolic panel   CBC with Differential/Platelet   Protime-INR   EKG 12-Lead (Completed)   Lone atrial fibrillation with RVR    Lone afib during hospitalization 2015.  Sees cards Dr Humphrey Rolls on sotalol and aspirin.  EKG reassuring stable today.      Relevant Orders   Protime-INR   Left ureteral stone    With hydroureteronephrosis pending surgery.       Hyperlipidemia   Relevant Orders   Lipid panel   COPD (chronic obstructive pulmonary disease) ([place])    combivent refilled to use PRN.       Relevant Medications   Ipratropium-Albuterol (COMBIVENT RESPIMAT) 20-100 MCG/ACT AERS respimat   Abnormal CT scan, gallbladder    Reviewed with patient. He prefers to wait until after urological intervention to pursue further evaluation of abnormal gallbladder finding. Discussed MRI vs other imaging. Will likely start with RUQ abd [place].        Other Visit Diagnoses    Dyspnea, unspecified type       Relevant Orders   Brain natriuretic peptide       Meds ordered this encounter  Medications  . DISCONTD: Ipratropium-Albuterol (COMBIVENT RESPIMAT) 20-100 MCG/ACT AERS respimat    Sig: Inhale 1 puff into the lungs every 6 (six)  hours as needed for wheezing. NEEDS OFFICE VISIT    Dispense:  4 g    Refill:  11  . Ipratropium-Albuterol (COMBIVENT RESPIMAT) 20-100 MCG/ACT AERS respimat    Sig: Inhale 1 puff into the lungs every 6 (six) hours as needed for wheezing.    Dispense:  4 g    Refill:  11   Orders Placed This Encounter  Procedures  . Lipid panel  . Comprehensive metabolic panel  . CBC with Differential/Platelet  . Protime-INR  . Brain natriuretic peptide  . EKG 12-Lead    Follow up plan: No follow-ups on file.  Ria Bush, MD

## 2017-11-16 ENCOUNTER — Ambulatory Visit (INDEPENDENT_AMBULATORY_CARE_PROVIDER_SITE_OTHER): Payer: Medicare Other | Admitting: Family Medicine

## 2017-11-16 ENCOUNTER — Ambulatory Visit: Payer: Medicare Other | Admitting: Family Medicine

## 2017-11-16 ENCOUNTER — Encounter: Payer: Self-pay | Admitting: Family Medicine

## 2017-11-16 VITALS — BP 126/70 | HR 76 | Temp 97.9°F | Ht 66.0 in | Wt 159.0 lb

## 2017-11-16 DIAGNOSIS — I4891 Unspecified atrial fibrillation: Secondary | ICD-10-CM

## 2017-11-16 DIAGNOSIS — E785 Hyperlipidemia, unspecified: Secondary | ICD-10-CM

## 2017-11-16 DIAGNOSIS — N289 Disorder of kidney and ureter, unspecified: Secondary | ICD-10-CM

## 2017-11-16 DIAGNOSIS — N201 Calculus of ureter: Secondary | ICD-10-CM

## 2017-11-16 DIAGNOSIS — Z01818 Encounter for other preprocedural examination: Secondary | ICD-10-CM | POA: Diagnosis not present

## 2017-11-16 DIAGNOSIS — R932 Abnormal findings on diagnostic imaging of liver and biliary tract: Secondary | ICD-10-CM

## 2017-11-16 DIAGNOSIS — J449 Chronic obstructive pulmonary disease, unspecified: Secondary | ICD-10-CM

## 2017-11-16 DIAGNOSIS — R0600 Dyspnea, unspecified: Secondary | ICD-10-CM | POA: Diagnosis not present

## 2017-11-16 DIAGNOSIS — R21 Rash and other nonspecific skin eruption: Secondary | ICD-10-CM

## 2017-11-16 MED ORDER — IPRATROPIUM-ALBUTEROL 20-100 MCG/ACT IN AERS: 1.0000 | INHALATION_SPRAY | Freq: Four times a day (QID) | RESPIRATORY_TRACT | 11 refills | Status: DC | PRN

## 2017-11-16 NOTE — Assessment & Plan Note (Addendum)
Lone afib during hospitalization 2015.  Sees cards Dr Humphrey Rolls on sotalol and aspirin.  EKG reassuring stable today.

## 2017-11-16 NOTE — Assessment & Plan Note (Signed)
Low risk procedure, low-mod risk patient (h/o afib).  RCRI = 0.  Check labwork as well as EKG today.  Likely adequately low risk to proceed with surgery without further testing/intervention.

## 2017-11-16 NOTE — Assessment & Plan Note (Signed)
combivent refilled to use PRN.

## 2017-11-16 NOTE — Patient Instructions (Addendum)
Labs today EKG today If all normal, ok to proceed with surgery -we will fax office note attn Dr Berenda Morale. For face - you could try cortisone-10 mixed with lotrimin cream (both over the counter).  Call me when ready for gallbladder ultrasound.

## 2017-11-16 NOTE — Assessment & Plan Note (Signed)
Update kidney function.  

## 2017-11-16 NOTE — Assessment & Plan Note (Signed)
Anticipate seborrheic dermatitis - suggested limited cortisone-10 OTC and lotrimin. Update with effect.

## 2017-11-16 NOTE — Assessment & Plan Note (Signed)
Reviewed with patient. He prefers to wait until after urological intervention to pursue further evaluation of abnormal gallbladder finding. Discussed MRI vs other imaging. Will likely start with RUQ abd [place].

## 2017-11-16 NOTE — Assessment & Plan Note (Signed)
With hydroureteronephrosis pending surgery.

## 2017-11-17 LAB — COMPREHENSIVE METABOLIC PANEL
ALT: 27 U/L (ref 0–53)
AST: 21 U/L (ref 0–37)
Albumin: 4 g/dL (ref 3.5–5.2)
Alkaline Phosphatase: 49 U/L (ref 39–117)
BILIRUBIN TOTAL: 0.4 mg/dL (ref 0.2–1.2)
BUN: 16 mg/dL (ref 6–23)
CALCIUM: 9.6 mg/dL (ref 8.4–10.5)
CO2: 33 meq/L — AB (ref 19–32)
CREATININE: 1.07 mg/dL (ref 0.40–1.50)
Chloride: 103 mEq/L (ref 96–112)
GFR: 72.74 mL/min (ref >60.00)
Glucose, Bld: 89 mg/dL (ref 70–99)
Potassium: 4.4 mEq/L (ref 3.5–5.1)
Sodium: 142 mEq/L (ref 135–145)
TOTAL PROTEIN: 6.7 g/dL (ref 6.0–8.3)

## 2017-11-17 LAB — CBC WITH DIFFERENTIAL/PLATELET
Basophils Absolute: 0.1 10*3/uL (ref 0.0–0.1)
Basophils Relative: 0.9 % (ref 0.0–3.0)
EOS PCT: 4.6 % (ref 0.0–5.0)
Eosinophils Absolute: 0.3 10*3/uL (ref 0.0–0.7)
HEMATOCRIT: 39.8 % (ref 39.0–52.0)
Hemoglobin: 13.4 g/dL (ref 13.0–17.0)
LYMPHS PCT: 35.5 % (ref 12.0–46.0)
Lymphs Abs: 2.4 10*3/uL (ref 0.7–4.0)
MCHC: 33.7 g/dL (ref 30.0–36.0)
MCV: 89.4 fl (ref 78.0–100.0)
Monocytes Absolute: 0.8 10*3/uL (ref 0.1–1.0)
Monocytes Relative: 11.3 % (ref 3.0–12.0)
NEUTROS ABS: 3.3 10*3/uL (ref 1.4–7.7)
Neutrophils Relative %: 47.7 % (ref 43.0–77.0)
PLATELETS: 289 10*3/uL (ref 150.0–400.0)
RBC: 4.45 Mil/uL (ref 4.22–5.81)
RDW: 14.3 % (ref 11.5–15.5)
WBC: 6.8 10*3/uL (ref 4.0–10.5)

## 2017-11-17 LAB — LIPID PANEL
CHOL/HDL RATIO: 5
CHOLESTEROL: 202 mg/dL — AB (ref 0–200)
HDL: 40.9 mg/dL (ref >39.00)
NonHDL: 161.27
Triglycerides: 221 mg/dL — ABNORMAL HIGH (ref 0.0–149.0)
VLDL: 44.2 mg/dL — ABNORMAL HIGH (ref 0.0–40.0)

## 2017-11-17 LAB — LDL CHOLESTEROL, DIRECT: Direct LDL: 152 mg/dL

## 2017-11-17 LAB — PROTIME-INR
INR: 1.1 ratio — ABNORMAL HIGH (ref 0.8–1.0)
Prothrombin Time: 12.4 s (ref 9.6–13.1)

## 2017-11-17 LAB — BRAIN NATRIURETIC PEPTIDE: Pro B Natriuretic peptide (BNP): 83 pg/mL (ref 0.0–100.0)

## 2017-11-23 DIAGNOSIS — I10 Essential (primary) hypertension: Secondary | ICD-10-CM | POA: Diagnosis not present

## 2017-11-23 DIAGNOSIS — E782 Mixed hyperlipidemia: Secondary | ICD-10-CM | POA: Diagnosis not present

## 2017-11-23 DIAGNOSIS — I4891 Unspecified atrial fibrillation: Secondary | ICD-10-CM | POA: Diagnosis not present

## 2017-11-26 ENCOUNTER — Encounter
Admission: RE | Admit: 2017-11-26 | Discharge: 2017-11-26 | Disposition: A | Discharge status: Home / Self Care | Payer: Medicare Other | Source: Ambulatory Visit | Attending: Urology | Admitting: Urology

## 2017-11-26 ENCOUNTER — Other Ambulatory Visit: Payer: Self-pay

## 2017-11-26 DIAGNOSIS — Z01812 Encounter for preprocedural laboratory examination: Secondary | ICD-10-CM | POA: Insufficient documentation

## 2017-11-26 HISTORY — DX: Unspecified cataract: H26.9

## 2017-11-26 HISTORY — DX: Cardiac arrhythmia, unspecified: I49.9

## 2017-11-26 LAB — URINALYSIS, ROUTINE W REFLEX MICROSCOPIC
BILIRUBIN URINE: NEGATIVE
Glucose, UA: NEGATIVE mg/dL
HGB URINE DIPSTICK: NEGATIVE
Ketones, ur: NEGATIVE mg/dL
Leukocytes, UA: NEGATIVE
Nitrite: NEGATIVE
PROTEIN: NEGATIVE mg/dL
Specific Gravity, Urine: 1.02 (ref 1.005–1.030)
pH: 7 (ref 5.0–8.0)

## 2017-11-26 NOTE — Patient Instructions (Signed)
Your procedure is scheduled on: 12/04/17 Fri Report to Same Day Surgery 2nd floor medical mall [facility] Entrance-take elevator on left to 2nd floor.  Check in with surgery information desk.) To find out your arrival time please call [phone] between 1PM - 3PM on 12/03/17 Fri  Remember: Instructions that are not followed completely may result in serious medical risk, up to and including death, or upon the discretion of your surgeon and anesthesiologist your surgery may need to be rescheduled.    _x___ 1. Do not eat food after midnight the night before your procedure. You may drink clear liquids up to 2 hours before you are scheduled to arrive at the hospital for your procedure.  Do not drink clear liquids within 2 hours of your scheduled arrival to the hospital.  Clear liquids include  --Water or Apple juice without pulp  --Clear carbohydrate beverage such as ClearFast or Gatorade  --Black Coffee or Clear Tea (No milk, no creamers, do not add anything to                  the coffee or Tea Type 1 and type 2 diabetics should only drink water.   ____Ensure clear carbohydrate drink on the way to the hospital for bariatric patients  ____Ensure clear carbohydrate drink 3 hours before surgery for Dr Dwyane Luo patients if physician instructed.   No gum chewing or hard candies.     __x__ 2. No Alcohol for 24 hours before or after surgery.   __x__3. No Smoking or e-cigarettes for 24 prior to surgery.  Do not use any chewable tobacco products for at least 6 hour prior to surgery   ____  4. Bring all medications with you on the day of surgery if instructed.    __x__ 5. Notify your doctor if there is any change in your medical condition     (cold, fever, infections).    x___6. On the morning of surgery brush your teeth with toothpaste and water.  You may rinse your mouth with mouth wash if you wish.  Do not swallow any toothpaste or mouthwash.   Do not wear jewelry, make-up, hairpins,  clips or nail polish.  Do not wear lotions, powders, or perfumes. You may wear deodorant.  Do not shave 48 hours prior to surgery. Men may shave face and neck.  Do not bring valuables to the hospital.    [facility] is not responsible for any belongings or valuables.               Contacts, dentures or bridgework may not be worn into surgery.  Leave your suitcase in the car. After surgery it may be brought to your room.  For patients admitted to the hospital, discharge time is determined by your                       treatment team.  _  Patients discharged the day of surgery will not be allowed to drive home.  You will need someone to drive you home and stay with you the night of your procedure.    Please read over the following fact sheets that you were given:   [facility] Preparing for Surgery and or MRSA Information   _x___ Take anti-hypertensive listed below, cardiac, seizure, asthma,     anti-reflux and psychiatric medicines. These include:  1. Ipratropium-Albuterol (COMBIVENT RESPIMAT) 20-100 MCG/ACT AERS respimat  2. pantoprazole (PROTONIX) 40 MG tablet  3.pravastatin (PRAVACHOL) 80 MG  tablet  4.sotalol (BETAPACE) 80 MG tablet  5.  6.  ____Fleets enema or Magnesium Citrate as directed.   _x___ Use CHG Soap or sage wipes as directed on instruction sheet   ____ Use inhalers on the day of surgery and bring to hospital day of surgery  ____ Stop Metformin and Janumet 2 days prior to surgery.    ____ Take 1/2 of usual insulin dose the night before surgery and none on the morning     surgery.   _x___ Follow recommendations from Cardiologist, Pulmonologist or PCP regarding          stopping Aspirin, Coumadin, Plavix ,Eliquis, Effient, or Pradaxa, and Pletal.  X____Stop Anti-inflammatories such as Advil, Aleve, Ibuprofen, Motrin, Naproxen, Naprosyn, Goodies powders or aspirin products. OK to take Tylenol and                          Celebrex.   _x___ Stop supplements until after  surgery.  But may continue Vitamin D, Vitamin B,       and multivitamin.   ____ Bring C-Pap to the hospital.

## 2017-11-27 LAB — URINE CULTURE: Culture: <10000 — AB

## 2017-12-03 MED ORDER — CIPROFLOXACIN IN D5W 400 MG/200ML IV SOLN
400.0000 mg | INTRAVENOUS | Status: AC
  Administered 2017-12-04: 400 mg via INTRAVENOUS

## 2017-12-04 ENCOUNTER — Ambulatory Visit: Payer: Medicare Other | Admitting: Anesthesiology

## 2017-12-04 ENCOUNTER — Other Ambulatory Visit: Payer: Self-pay

## 2017-12-04 ENCOUNTER — Telehealth: Payer: Self-pay | Admitting: Urology

## 2017-12-04 ENCOUNTER — Ambulatory Visit: Payer: Medicare Other

## 2017-12-04 ENCOUNTER — Encounter
Admission: RE | Disposition: A | Discharge status: Home / Self Care | Payer: Self-pay | Source: Ambulatory Visit | Attending: Urology

## 2017-12-04 ENCOUNTER — Ambulatory Visit
Admission: RE | Admit: 2017-12-04 | Discharge: 2017-12-04 | Disposition: A | Discharge status: Home / Self Care | Payer: Medicare Other | Source: Ambulatory Visit | Attending: Urology | Admitting: Urology

## 2017-12-04 DIAGNOSIS — Z79899 Other long term (current) drug therapy: Secondary | ICD-10-CM | POA: Insufficient documentation

## 2017-12-04 DIAGNOSIS — N202 Calculus of kidney with calculus of ureter: Secondary | ICD-10-CM | POA: Diagnosis not present

## 2017-12-04 DIAGNOSIS — I4891 Unspecified atrial fibrillation: Secondary | ICD-10-CM | POA: Insufficient documentation

## 2017-12-04 DIAGNOSIS — M1990 Unspecified osteoarthritis, unspecified site: Secondary | ICD-10-CM | POA: Insufficient documentation

## 2017-12-04 DIAGNOSIS — H409 Unspecified glaucoma: Secondary | ICD-10-CM | POA: Insufficient documentation

## 2017-12-04 DIAGNOSIS — N35919 Unspecified urethral stricture, male, unspecified site: Secondary | ICD-10-CM | POA: Insufficient documentation

## 2017-12-04 DIAGNOSIS — I2510 Atherosclerotic heart disease of native coronary artery without angina pectoris: Secondary | ICD-10-CM | POA: Diagnosis not present

## 2017-12-04 DIAGNOSIS — N201 Calculus of ureter: Secondary | ICD-10-CM | POA: Diagnosis not present

## 2017-12-04 DIAGNOSIS — Z87891 Personal history of nicotine dependence: Secondary | ICD-10-CM | POA: Insufficient documentation

## 2017-12-04 DIAGNOSIS — J449 Chronic obstructive pulmonary disease, unspecified: Secondary | ICD-10-CM | POA: Insufficient documentation

## 2017-12-04 DIAGNOSIS — E785 Hyperlipidemia, unspecified: Secondary | ICD-10-CM | POA: Diagnosis not present

## 2017-12-04 DIAGNOSIS — K219 Gastro-esophageal reflux disease without esophagitis: Secondary | ICD-10-CM | POA: Diagnosis not present

## 2017-12-04 DIAGNOSIS — Z7982 Long term (current) use of aspirin: Secondary | ICD-10-CM | POA: Insufficient documentation

## 2017-12-04 HISTORY — PX: CYSTOSCOPY/URETEROSCOPY/HOLMIUM LASER/STENT PLACEMENT: SHX6546

## 2017-12-04 HISTORY — PX: CYSTOSCOPY WITH URETHRAL DILATATION: SHX5125

## 2017-12-04 SURGERY — CYSTOSCOPY/URETEROSCOPY/HOLMIUM LASER/STENT PLACEMENT
Anesthesia: General | Laterality: Left

## 2017-12-04 MED ORDER — FENTANYL CITRATE (PF) 100 MCG/2ML IJ SOLN
INTRAMUSCULAR | Status: AC
  Filled 2017-12-04: qty 2

## 2017-12-04 MED ORDER — ONDANSETRON HCL 4 MG/2ML IJ SOLN
INTRAMUSCULAR | Status: DC | PRN
  Administered 2017-12-04: 4 mg via INTRAVENOUS

## 2017-12-04 MED ORDER — MIDAZOLAM HCL 2 MG/2ML IJ SOLN
INTRAMUSCULAR | Status: DC | PRN
  Administered 2017-12-04: 2 mg via INTRAVENOUS

## 2017-12-04 MED ORDER — PROPOFOL 10 MG/ML IV BOLUS
INTRAVENOUS | Status: DC | PRN
  Administered 2017-12-04: 150 mg via INTRAVENOUS

## 2017-12-04 MED ORDER — MIDAZOLAM HCL 2 MG/2ML IJ SOLN
INTRAMUSCULAR | Status: AC
  Filled 2017-12-04: qty 2

## 2017-12-04 MED ORDER — FENTANYL CITRATE (PF) 100 MCG/2ML IJ SOLN
INTRAMUSCULAR | Status: DC | PRN
  Administered 2017-12-04 (×3): 50 ug via INTRAVENOUS

## 2017-12-04 MED ORDER — CIPROFLOXACIN IN D5W 400 MG/200ML IV SOLN
INTRAVENOUS | Status: AC
  Filled 2017-12-04: qty 200

## 2017-12-04 MED ORDER — LACTATED RINGERS IV SOLN
INTRAVENOUS | Status: DC
  Administered 2017-12-04: 13:00:00 via INTRAVENOUS

## 2017-12-04 MED ORDER — SULFAMETHOXAZOLE-TRIMETHOPRIM 800-160 MG PO TABS: 1.0000 | ORAL_TABLET | Freq: Every day | ORAL | 0 refills | Status: DC

## 2017-12-04 MED ORDER — FENTANYL CITRATE (PF) 100 MCG/2ML IJ SOLN: 25.0000 ug | INTRAMUSCULAR | Status: DC | PRN

## 2017-12-04 MED ORDER — ONDANSETRON HCL 4 MG/2ML IJ SOLN: 4.0000 mg | Freq: Once | INTRAMUSCULAR | Status: DC | PRN

## 2017-12-04 MED ORDER — LIDOCAINE HCL (CARDIAC) PF 100 MG/5ML IV SOSY
PREFILLED_SYRINGE | INTRAVENOUS | Status: DC | PRN
  Administered 2017-12-04: 100 mg via INTRAVENOUS

## 2017-12-04 MED ORDER — TAMSULOSIN HCL 0.4 MG PO CAPS: 0.4000 mg | ORAL_CAPSULE | Freq: Every day | ORAL | 0 refills | Status: DC

## 2017-12-04 MED ORDER — IOPAMIDOL (ISOVUE-M 200) INJECTION 41%
INTRAMUSCULAR | Status: DC | PRN
  Administered 2017-12-04: 5 mL

## 2017-12-04 MED ORDER — DEXAMETHASONE SODIUM PHOSPHATE 10 MG/ML IJ SOLN
INTRAMUSCULAR | Status: DC | PRN
  Administered 2017-12-04: 10 mg via INTRAVENOUS

## 2017-12-04 MED ORDER — HYDROCODONE-ACETAMINOPHEN 5-325 MG PO TABS: 1.0000 | ORAL_TABLET | ORAL | 0 refills | Status: AC | PRN

## 2017-12-04 MED ORDER — PHENYLEPHRINE HCL 10 MG/ML IJ SOLN
INTRAMUSCULAR | Status: DC | PRN
  Administered 2017-12-04 (×4): 100 ug via INTRAVENOUS

## 2017-12-04 SURGICAL SUPPLY — 35 items
BAG DRAIN CYSTO-URO LG1000N (MISCELLANEOUS) ×4 IMPLANT
BRUSH SCRUB EZ 1% IODOPHOR (MISCELLANEOUS) ×4 IMPLANT
BULB IRRIG PATHFIND (MISCELLANEOUS) ×4 IMPLANT
CATH FOLEY 2W COUNCIL 5CC 18FR (CATHETERS) ×2 IMPLANT
CATH SET URETHRAL DILATOR (CATHETERS) ×2 IMPLANT
CATH URETL 5X70 OPEN END (CATHETERS) IMPLANT
CNTNR SPEC 2.5X3XGRAD LEK (MISCELLANEOUS)
CONT SPEC 4OZ STER OR WHT (MISCELLANEOUS)
CONT SPEC 4OZ STRL OR WHT (MISCELLANEOUS)
CONTAINER SPEC 2.5X3XGRAD LEK (MISCELLANEOUS) IMPLANT
DRAPE UTILITY 15X26 TOWEL STRL (DRAPES) ×4 IMPLANT
FIBER LASER LITHO 273 (Laser) IMPLANT
GLOVE BIOGEL PI IND STRL 7.5 (GLOVE) ×2 IMPLANT
GLOVE BIOGEL PI INDICATOR 7.5 (GLOVE) ×2
GOWN STRL REUS W/ TWL LRG LVL3 (GOWN DISPOSABLE) ×2 IMPLANT
GOWN STRL REUS W/ TWL XL LVL3 (GOWN DISPOSABLE) ×2 IMPLANT
GOWN STRL REUS W/TWL LRG LVL3 (GOWN DISPOSABLE) ×4
GOWN STRL REUS W/TWL XL LVL3 (GOWN DISPOSABLE) ×4
INTRODUCER DILATOR DOUBLE (INTRODUCER) IMPLANT
KIT TURNOVER CYSTO (KITS) ×4 IMPLANT
PACK CYSTO AR (MISCELLANEOUS) ×4 IMPLANT
SENSORWIRE 0.038 NOT ANGLED (WIRE) ×4
SET CYSTO W/LG BORE CLAMP LF (SET/KITS/TRAYS/PACK) ×4 IMPLANT
SHEATH URETERAL 12FRX35CM (MISCELLANEOUS) IMPLANT
SOL .9 NS 3000ML IRR  AL (IV SOLUTION) ×2
SOL .9 NS 3000ML IRR AL (IV SOLUTION) ×2
SOL .9 NS 3000ML IRR UROMATIC (IV SOLUTION) ×2 IMPLANT
STENT URET 6FRX24 CONTOUR (STENTS) IMPLANT
STENT URET 6FRX26 CONTOUR (STENTS) ×2 IMPLANT
SURGILUBE 2OZ TUBE FLIPTOP (MISCELLANEOUS) ×4 IMPLANT
SYR 10ML LL (SYRINGE) ×4 IMPLANT
TUBING ART PRESS 48 MALE/FEM (TUBING) ×4 IMPLANT
VALVE UROSEAL ADJ ENDO (VALVE) ×2 IMPLANT
WATER STERILE IRR 1000ML POUR (IV SOLUTION) ×4 IMPLANT
WIRE SENSOR 0.038 NOT ANGLED (WIRE) ×2 IMPLANT

## 2017-12-04 NOTE — H&P (Signed)
UROLOGY H&P UPDATE  Agree with prior H&P dated 11/05/2017. 29mm left distal ureteral stone.  Cardiac: RRR Lungs: CTA bilaterally  Laterality: LEFT Procedure: LEFT URS/LL/stent  Urinalysis: No significant growth on culture 10/24  Informed consent obtained, we specifically discussed the risks of bleeding, infection, ureteral injury, need for additional procedures, and stent related symptoms including urgency, frequency, flank pain, dysuria, and possible need for temporary stent placement and staged treatment.  Billey Co, MD 12/04/2017

## 2017-12-04 NOTE — Telephone Encounter (Signed)
Appts made and given to [facility] on the floor  [place]

## 2017-12-04 NOTE — Transfer of Care (Signed)
Immediate Anesthesia Transfer of Care Note  Patient: Victor Cortez  Procedure(s) Performed: CYSTOSCOPY/URETEROSCOPY/HOLMIUM LASER/STENT PLACEMENT (Left ) CYSTOSCOPY WITH URETHRAL DILATATION  Patient Location: PACU  Anesthesia Type:General  Level of Consciousness: sedated  Airway & Oxygen Therapy: Patient Spontanous Breathing and Patient connected to face mask oxygen  Post-op Assessment: Report given to RN and Post -op Vital signs reviewed and stable  Post vital signs: Reviewed and stable  Last Vitals:  Vitals Value Taken Time  BP    Temp    Pulse    Resp 11 12/04/2017  2:07 PM  SpO2    Vitals shown include unvalidated device data.  Last Pain:  Vitals:   12/04/17 1230  TempSrc: Oral         Complications: No apparent anesthesia complications

## 2017-12-04 NOTE — Op Note (Signed)
Date of procedure: 12/04/17  Preoperative diagnosis:  1. Left distal ureteral stone  Postoperative diagnosis:  1.  Left renal stones 2.  Fossa navicularis stricture  Procedure:       1. Dilation of fossa navicular stricture       2.  Left ureteroscopy, laser lithotripsy, stent placement       3.  Left retrograde pyelogram with intraoperative interpretation  Surgeon: Nickolas Madrid, MD  Anesthesia: General  Complications: None  Intraoperative findings:  1.  6 French fossa navicularis stricture requiring serial dilation 2.  Left distal ureteral stone had passed into the bladder 3.  Uncomplicated left ureteroscopy, dusting of renal stones, and stent placement  EBL: Minimal  Specimens: Stone for analysis  Drains: Left 6FX 26 cm stent, 18 French council Foley  Indication: Victor Cortez is a 67 y.o. patient that presented with microscopic hematuria, and was found to have a 7 mm left distal ureteral stone that was completely asymptomatic.  After reviewing the management options for treatment, they elected to proceed with the above surgical procedure(s). We have discussed the potential benefits and risks of the procedure, side effects of the proposed treatment, the likelihood of the patient achieving the goals of the procedure, and any potential problems that might occur during the procedure or recuperation. Informed consent has been obtained.  Description of procedure:  The patient was taken to the operating room and general anesthesia was induced.  The patient was placed in the dorsal lithotomy position, prepped and draped in the usual sterile fashion, and preoperative antibiotics were administered. A preoperative time-out was performed.   I attempted to place the 21 French rigid cystoscope however met resistance.  There was significant fossa navicularis stricture.  Under vision I was able to pass a wire through the scope and through the stricture.  I then used serial axial dilators  up to 21 [place].  I was unable to place the rigid cystoscope alongside the wire and into the bladder.  Notably there is some diffuse narrowing of the urethra.  The prostate was a small with no obstructing lobes.  Upon entering the bladder we clearly saw an approximately 7 mm yellow stone at the base of the bladder.  This was irrigated free and sent for stone analysis. Thorough cystoscopy was performed and showed no concerning lesions.  8.035 sensor wire was advanced into the left ureteral orifice up to the renal pelvis under fluoroscopic vision.  The bladder was drained and a single channel digital flexible ureteroscope passed easily over the wire up to the kidney.  Thorough inspection of the kidney did not reveal any concerning lesions.  We used the 273 m laser fiber to dust the 4 mm stone in the upper pole, as well as the 3 mm stone in the midpole.  Thorough inspection revealed no residual significant stone fragments.  Contrast was injected and opacified the collecting system to aid in stent placement, and no filling defects were noted.  The sensor wire was replaced through the ureteroscope.  Pullback ureteroscopy did not demonstrate any residual fragments in the left ureter.  A 6 French by 26 cm stent was then uneventfully placed under direct vision with the rigid cystoscope with a good curl noted in the upper pole on fluoroscopy as well as under direct vision in the bladder.  We then passed a sensor wire through the scope into the bladder and an 80 [place] council Foley was placed easily over the wire with return of clear fluid.  10 cc were placed in the balloon.  Disposition: Stable to PACU  Plan: Follow-up in clinic on Monday for void trial Follow-up in 1 week for stent removal Prophylactic Bactrim while stent in  Nickolas Madrid, MD

## 2017-12-04 NOTE — Anesthesia Post-op Follow-up Note (Signed)
Anesthesia QCDR form completed.        

## 2017-12-04 NOTE — Discharge Instructions (Signed)
AMBULATORY SURGERY  °DISCHARGE INSTRUCTIONS ° ° °1) The drugs that you were given will stay in your system until tomorrow so for the next 24 hours you should not: ° °A) Drive an automobile °B) Make any legal decisions °C) Drink any alcoholic beverage ° ° °2) You may resume regular meals tomorrow.  Today it is better to start with liquids and gradually work up to solid foods. ° °You may eat anything you prefer, but it is better to start with liquids, then soup and crackers, and gradually work up to solid foods. ° ° °3) Please notify your doctor immediately if you have any unusual bleeding, trouble breathing, redness and pain at the surgery site, drainage, fever, or pain not relieved by medication. ° ° ° °4) Additional Instructions: ° ° ° ° ° ° ° °Please contact your physician with any problems or Same Day Surgery at [phone], Monday through Friday 6 am to 4 pm, or [place] [facility] [place] Main number at [phone]. °

## 2017-12-04 NOTE — Anesthesia Procedure Notes (Signed)
Procedure Name: LMA Insertion Date/Time: 12/04/2017 1:15 PM Performed by: Nelda Marseille, CRNA Pre-anesthesia Checklist: Patient identified, Patient being monitored, Timeout performed, Emergency Drugs available and Suction available Patient Re-evaluated:Patient Re-evaluated prior to induction Oxygen Delivery Method: Circle system utilized Preoxygenation: Pre-oxygenation with 100% oxygen Induction Type: IV induction Ventilation: Mask ventilation without difficulty LMA: LMA inserted LMA Size: 4.0 Tube type: Oral Number of attempts: 1 Placement Confirmation: positive ETCO2 and breath sounds checked- equal and bilateral Tube secured with: Tape Dental Injury: Teeth and Oropharynx as per pre-operative assessment

## 2017-12-04 NOTE — Anesthesia Preprocedure Evaluation (Addendum)
Anesthesia Evaluation  Patient identified by MRN, date of birth, ID band Patient awake    Reviewed: Allergy & Precautions, H&P , NPO status , Patient's Chart, lab work & pertinent test results  History of Anesthesia Complications Negative for: history of anesthetic complications  Airway Mallampati: I       Dental  (+) Missing, Chipped   Pulmonary neg sleep apnea, COPD,  COPD inhaler, former smoker,           Cardiovascular (-) hypertension+ CAD  (-) Past MI and (-) CHF + dysrhythmias Atrial Fibrillation (-) Valvular Problems/Murmurs     Neuro/Psych neg Seizures negative psych ROS   GI/Hepatic Neg liver ROS, GERD  Medicated,  Endo/Other  neg diabetes  Renal/GU Renal disease     Musculoskeletal  (+) Arthritis ,   Abdominal   Peds  Hematology negative hematology ROS (+)   Anesthesia Other Findings  No date: Cataract 10/2017: Cholelithiasis     Comment:  by CT No date: COPD (chronic obstructive pulmonary disease) ([place]) No date: Dysrhythmia No date: ED (erectile dysfunction) No date: Ex-smokerPast Medical History: No date: Aortic atherosclerosis (HCC)     Comment:  by CT No date: Arthritis 06/2013: Atrial fibrillation with RVR (H[facility])     Comment:  during hospitalization 1998: Basal cell carcinoma of nose No date: CAD (coronary artery disease)     Comment:  by CT No date: GERD (gastroesophageal reflux disease) No date: Glaucoma     Comment:  Dr. Matilde Sprang No date: Glaucoma No date: Hepatic steatosis     Comment:  by CT No date: History of chicken pox 06/2013: History of pneumonia     Comment:  with sepsis and [facility] hospitalization No date: Hyperlipidemia No date: Rosacea No date: Seborrheic dermatitis  Past Surgical History: 2016: ROTATOR CUFF REPAIR  BMI    Body Mass Index:  25.82 kg/m      Reproductive/Obstetrics negative OB ROS                            Anesthesia  Physical Anesthesia Plan  ASA: III  Anesthesia Plan: General ETT and General   Post-op Pain Management:    Induction: Intravenous  PONV Risk Score and Plan: 2 and Ondansetron and Dexamethasone  Airway Management Planned: LMA  Additional Equipment:   Intra-op Plan:   Post-operative Plan:   Informed Consent: I have reviewed the patients History and Physical, chart, labs and discussed the procedure including the risks, benefits and alternatives for the proposed anesthesia with the patient or authorized representative who has indicated his/her understanding and acceptance.   Dental Advisory Given  Plan Discussed with: Anesthesiologist, CRNA and Surgeon  Anesthesia Plan Comments:        Anesthesia Quick Evaluation

## 2017-12-04 NOTE — Telephone Encounter (Signed)
PACU called office to make a return appt for pt who was going home from surgery and there are no notes as to when and what pt needs to do.  I told them we would call pt with follow up info.

## 2017-12-04 NOTE — OR Nursing (Signed)
Dr Alvy Bimler called to clariy foley catheter orders. Wife had left building and was not available to question. Office could not get in touch with Dr Glori Luis.  Wife got back to hospital later and said Dr Glori Luis left word that foley was to stay in place until Monday. Office then gave me appointment date of Monday November 4 at 10:00am. Information given to pt. Foley care information given to pt. Pt and wife shown how to empty foley, change to leg bag and clean.

## 2017-12-07 ENCOUNTER — Telehealth: Payer: Self-pay

## 2017-12-07 ENCOUNTER — Ambulatory Visit (INDEPENDENT_AMBULATORY_CARE_PROVIDER_SITE_OTHER): Payer: Medicare Other | Admitting: Family Medicine

## 2017-12-07 ENCOUNTER — Encounter: Payer: Self-pay | Admitting: Urology

## 2017-12-07 DIAGNOSIS — N201 Calculus of ureter: Secondary | ICD-10-CM

## 2017-12-07 NOTE — Telephone Encounter (Signed)
Patient was in to hospital for a scheduled cystoscopy procedure and has follow up with urology after discharge.  No follow up with PCP at this time.  FYI to Dr. Danise Mina - just let me know if anything further needed at this time.  Thanks.

## 2017-12-07 NOTE — Progress Notes (Signed)
Catheter Removal  Patient is present today for a catheter removal.  28ml of water was drained from the balloon. A 18FR foley cath was removed from the bladder no complications were noted . Patient tolerated well.  Preformed by: Elberta Leatherwood. CMA

## 2017-12-08 NOTE — Anesthesia Postprocedure Evaluation (Signed)
Anesthesia Post Note  Patient: Victor Cortez  Procedure(s) Performed: CYSTOSCOPY/URETEROSCOPY/HOLMIUM LASER/STENT PLACEMENT (Left ) CYSTOSCOPY WITH URETHRAL DILATATION  Patient location during evaluation: PACU Anesthesia Type: General Level of consciousness: awake and alert Pain management: pain level controlled Vital Signs Assessment: post-procedure vital signs reviewed and stable Respiratory status: spontaneous breathing, nonlabored ventilation, respiratory function stable and patient connected to nasal cannula oxygen Cardiovascular status: blood pressure returned to baseline and stable Postop Assessment: no apparent nausea or vomiting Anesthetic complications: no     Last Vitals:  Vitals:   12/04/17 1504 12/04/17 1509  BP: (!) 155/72 (!) 170/83  Pulse: (!) 59 (!) 59  Resp: 15 16  Temp:  (!) 36.2 C  SpO2: 97% 98%    Last Pain:  Vitals:   12/07/17 0809  TempSrc:   PainSc: 0-No pain                 Durenda Hurt

## 2017-12-10 ENCOUNTER — Ambulatory Visit (INDEPENDENT_AMBULATORY_CARE_PROVIDER_SITE_OTHER): Payer: Medicare Other | Admitting: Urology

## 2017-12-10 ENCOUNTER — Other Ambulatory Visit: Payer: Self-pay

## 2017-12-10 ENCOUNTER — Encounter: Payer: Self-pay | Admitting: Urology

## 2017-12-10 VITALS — BP 120/76 | HR 65 | Ht 66.0 in | Wt 155.7 lb

## 2017-12-10 DIAGNOSIS — R3129 Other microscopic hematuria: Secondary | ICD-10-CM

## 2017-12-10 DIAGNOSIS — N200 Calculus of kidney: Secondary | ICD-10-CM

## 2017-12-10 LAB — MICROSCOPIC EXAMINATION: Epithelial Cells (non renal): NONE SEEN /hpf (ref 0–10)

## 2017-12-10 LAB — URINALYSIS, COMPLETE
Bilirubin, UA: NEGATIVE
GLUCOSE, UA: NEGATIVE
Ketones, UA: NEGATIVE
Nitrite, UA: NEGATIVE
SPEC GRAV UA: 1.015 (ref 1.005–1.030)
Urobilinogen, Ur: 0.2 mg/dL (ref 0.2–1.0)
pH, UA: 7.5 (ref 5.0–7.5)

## 2017-12-10 MED ORDER — CIPROFLOXACIN HCL 500 MG PO TABS: 500.0000 mg | ORAL_TABLET | Freq: Once | ORAL | Status: DC

## 2017-12-10 NOTE — Progress Notes (Signed)
Cystoscopy Procedure Note:  Indication: Stent removal s/p 12/04/17 L URS/LL/stent for small left renal stones (left ureteral stone had spontaneously passed on induction). Also had dilation of fossa navicularis stricture intra-op, foley removed POD#3.  After informed consent and discussion of the procedure and its risks, NYGEL PROKOP was positioned and prepped in the standard fashion. Cystoscopy was performed with a flexible cystoscope. The stent was grasped with flexible graspers and removed in its entirety. The patient tolerated the procedure well.  Findings: Uncomplicated stent removal  Assessment and Plan: Follow up in 4 weeks with renal ultrasound to evaluate for silent hydronephrosis, and PVR  Billey Co, MD 12/10/2017

## 2017-12-11 LAB — STONE ANALYSIS
CA PHOS CRY STONE QL IR: 20 %
Ca Oxalate,Dihydrate: 25 %
Ca Oxalate,Monohydr.: 55 %
Stone Weight KSTONE: 84 mg

## 2018-01-07 DIAGNOSIS — I4891 Unspecified atrial fibrillation: Secondary | ICD-10-CM | POA: Diagnosis not present

## 2018-01-07 DIAGNOSIS — I10 Essential (primary) hypertension: Secondary | ICD-10-CM | POA: Diagnosis not present

## 2018-01-07 DIAGNOSIS — I2510 Atherosclerotic heart disease of native coronary artery without angina pectoris: Secondary | ICD-10-CM | POA: Diagnosis not present

## 2018-01-07 DIAGNOSIS — R0602 Shortness of breath: Secondary | ICD-10-CM | POA: Diagnosis not present

## 2018-01-08 ENCOUNTER — Ambulatory Visit
Admission: RE | Admit: 2018-01-08 | Discharge: 2018-01-08 | Disposition: A | Discharge status: Home / Self Care | Payer: Medicare Other | Source: Ambulatory Visit | Attending: Urology | Admitting: Urology

## 2018-01-08 DIAGNOSIS — N200 Calculus of kidney: Secondary | ICD-10-CM | POA: Insufficient documentation

## 2018-01-14 ENCOUNTER — Ambulatory Visit (INDEPENDENT_AMBULATORY_CARE_PROVIDER_SITE_OTHER): Payer: Medicare Other | Admitting: Urology

## 2018-01-14 ENCOUNTER — Encounter: Payer: Self-pay | Admitting: Urology

## 2018-01-14 VITALS — BP 128/82 | HR 72 | Ht 66.0 in | Wt 157.8 lb

## 2018-01-14 DIAGNOSIS — N200 Calculus of kidney: Secondary | ICD-10-CM

## 2018-01-14 DIAGNOSIS — R3129 Other microscopic hematuria: Secondary | ICD-10-CM | POA: Diagnosis not present

## 2018-01-14 DIAGNOSIS — N529 Male erectile dysfunction, unspecified: Secondary | ICD-10-CM | POA: Diagnosis not present

## 2018-01-14 LAB — BLADDER SCAN AMB NON-IMAGING: Scan Result: 29

## 2018-01-14 MED ORDER — TADALAFIL 20 MG PO TABS: 20.0000 mg | ORAL_TABLET | Freq: Every day | ORAL | 11 refills | Status: DC | PRN

## 2018-01-14 NOTE — Progress Notes (Signed)
   01/14/2018 5:10 PM   Victor Cortez 1950-11-19 [id number]  Reason for visit: Follow up left ureteroscopy, urethral dilation  HPI: I saw Victor Cortez back in urology clinic for follow-up after left ureteroscopy and urethral dilation.  On 12/04/2017 he underwent uncomplicated left ureteroscopy, laser lithotripsy and stent placement as well as dilation of the fossa navicularis stricture and temporary Foley placement.  His stent has since been removed.  He is doing very well and denies any flank pain, gross hematuria, or voiding symptoms.  Follow-up renal ultrasound showed no hydronephrosis.  He is voiding well with a strong stream.  PVR is 29 cc in clinic today.  He also would like to talk about erectile dysfunction today.  This is been going on for many years.  He has previously tried a low-dose of Viagra and Cialis in the past with minimal improvement.  He would like to try 1 of these medications again.  He does not take any nitrates for chest pain.   ROS: Please see flowsheet from today's date for complete review of systems.  Physical Exam: BP 128/82   Pulse 72   Ht 5\' 6"  (1.676 m)   Wt 157 lb 12.8 oz (71.6 kg)   BMI 25.47 kg/m    Constitutional:  Alert and oriented, No acute distress. Respiratory: Normal respiratory effort, no increased work of breathing. GI: Abdomen is soft, nontender, nondistended, no abdominal masses GU: No CVA tenderness Skin: No rashes, bruises or suspicious lesions. Neurologic: Grossly intact, no focal deficits, moving all 4 extremities. Psychiatric: Normal mood and affect   Pertinent Imaging:  I have personally reviewed the renal ultrasound, no hydronephrosis.  Right 1 cm lower pole stone  Assessment & Plan:   In summary, Victor Cortez is a 67 year old male who is 1 month out from a left ureteroscopy, laser lithotripsy, and stent placement as well as urethral dilation.  He is doing very well with a strong stream and no residual hydronephrosis on  follow-up ultrasound.  He also reports erectile dysfunction and is interested in trying oral medications for this.  We discussed the PDE 5 inhibitors and the risks and benefits, as well as other strategies including vacuum erection device, injections, and prostheses.  Prescription for Cialis 20 mg as needed given today, good Rx coupon provided RTC 1 year for KUB and PVR, discussed ED  Billey Co, MD  [facility] [address], [place], [place] [address] [phone]

## 2018-03-22 DIAGNOSIS — H2511 Age-related nuclear cataract, right eye: Secondary | ICD-10-CM | POA: Diagnosis not present

## 2018-03-26 DIAGNOSIS — I4891 Unspecified atrial fibrillation: Secondary | ICD-10-CM | POA: Diagnosis not present

## 2018-03-26 DIAGNOSIS — I10 Essential (primary) hypertension: Secondary | ICD-10-CM | POA: Diagnosis not present

## 2018-03-26 DIAGNOSIS — I2510 Atherosclerotic heart disease of native coronary artery without angina pectoris: Secondary | ICD-10-CM | POA: Diagnosis not present

## 2018-03-26 DIAGNOSIS — R0602 Shortness of breath: Secondary | ICD-10-CM | POA: Diagnosis not present

## 2018-03-26 DIAGNOSIS — E782 Mixed hyperlipidemia: Secondary | ICD-10-CM | POA: Diagnosis not present

## 2018-04-05 DIAGNOSIS — H40053 Ocular hypertension, bilateral: Secondary | ICD-10-CM | POA: Diagnosis not present

## 2018-06-24 DIAGNOSIS — R0602 Shortness of breath: Secondary | ICD-10-CM | POA: Diagnosis not present

## 2018-06-24 DIAGNOSIS — I4891 Unspecified atrial fibrillation: Secondary | ICD-10-CM | POA: Diagnosis not present

## 2018-06-24 DIAGNOSIS — E782 Mixed hyperlipidemia: Secondary | ICD-10-CM | POA: Diagnosis not present

## 2018-06-24 DIAGNOSIS — I2510 Atherosclerotic heart disease of native coronary artery without angina pectoris: Secondary | ICD-10-CM | POA: Diagnosis not present

## 2018-06-24 DIAGNOSIS — I480 Paroxysmal atrial fibrillation: Secondary | ICD-10-CM | POA: Diagnosis not present

## 2018-06-24 DIAGNOSIS — I10 Essential (primary) hypertension: Secondary | ICD-10-CM | POA: Diagnosis not present

## 2018-09-14 ENCOUNTER — Telehealth: Payer: Self-pay | Admitting: *Deleted

## 2018-09-14 DIAGNOSIS — Z122 Encounter for screening for malignant neoplasm of respiratory organs: Secondary | ICD-10-CM

## 2018-09-14 DIAGNOSIS — Z87891 Personal history of nicotine dependence: Secondary | ICD-10-CM

## 2018-09-14 NOTE — Telephone Encounter (Signed)
Patient has been notified that annual lung cancer screening low dose CT scan is due currently or will be in near future. Confirmed that patient is within the age range of 55-77, and asymptomatic, (no signs or symptoms of lung cancer). Patient denies illness that would prevent curative treatment for lung cancer if found. Verified smoking history, (former, quit 06/03/13, 106 pack year). The shared decision making visit was done 08/26/16. Patient is agreeable for CT scan being scheduled.

## 2018-09-20 ENCOUNTER — Ambulatory Visit: Payer: Medicare Other

## 2018-09-27 ENCOUNTER — Other Ambulatory Visit: Payer: Self-pay

## 2018-09-27 ENCOUNTER — Ambulatory Visit
Admission: RE | Admit: 2018-09-27 | Discharge: 2018-09-27 | Disposition: A | Discharge status: Home / Self Care | Payer: Medicare Other | Source: Ambulatory Visit | Attending: Oncology | Admitting: Oncology

## 2018-09-27 DIAGNOSIS — Z87891 Personal history of nicotine dependence: Secondary | ICD-10-CM | POA: Insufficient documentation

## 2018-09-27 DIAGNOSIS — Z122 Encounter for screening for malignant neoplasm of respiratory organs: Secondary | ICD-10-CM | POA: Insufficient documentation

## 2018-09-28 ENCOUNTER — Telehealth: Payer: Self-pay | Admitting: *Deleted

## 2018-09-28 DIAGNOSIS — R911 Solitary pulmonary nodule: Secondary | ICD-10-CM

## 2018-09-28 NOTE — Telephone Encounter (Signed)
After reviewing scan with PCP and thoracic navigator, patient notified of CT screening results and recommendation for evaluation and workup in multidisciplinary thoracic clinic. Patient is agreeable with this plan.   IMPRESSION: Lung-RADS 4B, suspicious. Additional imaging evaluation or consultation with Pulmonology or Thoracic Surgery recommended.  New 11.1 mm nodular opacity in the central left lower lobe. Consider follow up low-dose chest CT without contrast in 3 months (please use the following order, "CT CHEST LCS NODULE FOLLOW-UP W/O CM") or PET-CT. These results will be called to the ordering clinician or representative by the Radiologist Assistant, and communication documented in the PACS or zVision Dashboard.  Aortic Atherosclerosis (ICD10-I70.0) and Emphysema (ICD10-J43.9).

## 2018-09-28 NOTE — Telephone Encounter (Signed)
Noted. Thanks.

## 2018-09-28 NOTE — Addendum Note (Signed)
Addended by: Telford Nab on: 09/28/2018 03:29 PM   Modules accepted: Orders

## 2018-09-29 ENCOUNTER — Encounter: Payer: Self-pay | Admitting: *Deleted

## 2018-09-29 NOTE — Telephone Encounter (Signed)
Pt has been scheduled and made aware of all upcoming appts. Verbalized understanding.

## 2018-09-29 NOTE — Progress Notes (Signed)
  Oncology Nurse Navigator Documentation  Navigator Location: [facility] (09/29/18 1600) Referral Date to RadOnc/MedOnc: 09/27/18 (09/29/18 1600) )Navigator Encounter Type: Introductory Phone Call (09/29/18 1600)   Abnormal Finding Date: 09/27/18 (09/29/18 1600)           Multidisiplinary Clinic Date: 10/08/18 (09/29/18 1600) Multidisiplinary Clinic Type: Thoracic (09/29/18 1600)     Treatment Phase: Abnormal Scans (09/29/18 1600) Barriers/Navigation Needs: Coordination of Care (09/29/18 1600)   Interventions: Coordination of Care (09/29/18 1600)   Coordination of Care: Appts;Radiology (09/29/18 1600)        Acuity: Level 2 (09/29/18 1600)   Acuity Level 2: Initial guidance, education and coordination as needed;Educational needs;Assistance expediting appointments (09/29/18 1600)    phone call made to patient to introduce to navigator services and review upcoming scheduled appts. All questions answered during visit. Contact info given and instructed to call with any further questions or needs. Pt verbalized understanding. Nothing further needed at this time. Time Spent with Patient: 30 (09/29/18 1600)

## 2018-09-30 NOTE — Addendum Note (Signed)
Addended by: Telford Nab on: 09/30/2018 12:07 PM   Modules accepted: Orders

## 2018-10-04 ENCOUNTER — Other Ambulatory Visit: Payer: Self-pay

## 2018-10-04 ENCOUNTER — Encounter
Admission: RE | Admit: 2018-10-04 | Discharge: 2018-10-04 | Disposition: A | Discharge status: Home / Self Care | Payer: Medicare Other | Source: Ambulatory Visit | Attending: Oncology | Admitting: Oncology

## 2018-10-04 DIAGNOSIS — N200 Calculus of kidney: Secondary | ICD-10-CM | POA: Insufficient documentation

## 2018-10-04 DIAGNOSIS — K8020 Calculus of gallbladder without cholecystitis without obstruction: Secondary | ICD-10-CM | POA: Insufficient documentation

## 2018-10-04 DIAGNOSIS — K5730 Diverticulosis of large intestine without perforation or abscess without bleeding: Secondary | ICD-10-CM | POA: Diagnosis not present

## 2018-10-04 DIAGNOSIS — I700 Atherosclerosis of aorta: Secondary | ICD-10-CM | POA: Insufficient documentation

## 2018-10-04 DIAGNOSIS — N400 Enlarged prostate without lower urinary tract symptoms: Secondary | ICD-10-CM | POA: Diagnosis not present

## 2018-10-04 DIAGNOSIS — N433 Hydrocele, unspecified: Secondary | ICD-10-CM | POA: Diagnosis not present

## 2018-10-04 DIAGNOSIS — R911 Solitary pulmonary nodule: Secondary | ICD-10-CM

## 2018-10-04 LAB — GLUCOSE, CAPILLARY: Glucose-Capillary: 107 mg/dL — ABNORMAL HIGH (ref 70–99)

## 2018-10-04 MED ORDER — FLUDEOXYGLUCOSE F - 18 (FDG) INJECTION
8.2000 | Freq: Once | INTRAVENOUS | Status: AC | PRN
  Administered 2018-10-04: 8.95 via INTRAVENOUS

## 2018-10-04 NOTE — Progress Notes (Signed)
Pet scan results. Patient is scheduled to be discussed at case conference and see you and Dr. Patsey Berthold on Friday. Just FYI.  Faythe Casa, NP 10/04/2018 12:15 PM

## 2018-10-06 ENCOUNTER — Other Ambulatory Visit: Payer: Medicare Other

## 2018-10-06 ENCOUNTER — Encounter: Payer: Self-pay | Admitting: *Deleted

## 2018-10-06 DIAGNOSIS — K828 Other specified diseases of gallbladder: Secondary | ICD-10-CM

## 2018-10-06 DIAGNOSIS — R948 Abnormal results of function studies of other organs and systems: Secondary | ICD-10-CM

## 2018-10-06 NOTE — Progress Notes (Signed)
  Oncology Nurse Navigator Documentation  Navigator Location: [facility] (10/06/18 1000)   )Navigator Encounter Type: Telephone (10/06/18 1000) Telephone: Lahoma Crocker Call;Patient Update (10/06/18 1000)                       Barriers/Navigation Needs: Coordination of Care (10/06/18 1000)   Interventions: Coordination of Care (10/06/18 1000)   Coordination of Care: Appts;Radiology (10/06/18 1000)     Phone call made to patient to discuss PET scan results and change in upcoming appts. Informed pt that lung nodule has nearly resolved but abnormality was noted in the gallbladder which is recommended to follow up with MRI liver. Pt informed that PFT's have been cancelled since lung nodule has resolved but that he still needs to keep his appt with Dr. Tasia Catchings on Friday 9/4 to further discuss ordering MRI liver. All questions answered during phone call. Instructed to call back with any further questions. Pt verbalized understanding.              Time Spent with Patient: 30 (10/06/18 1000)

## 2018-10-07 ENCOUNTER — Ambulatory Visit: Payer: Medicare Other

## 2018-10-07 ENCOUNTER — Other Ambulatory Visit: Payer: Medicare Other

## 2018-10-07 NOTE — Progress Notes (Signed)
Tumor Board Documentation  Victor Cortez was presented by Norvel Richards, RNat our Tumor Board on 10/07/2018, which included representatives from medical oncology, radiation oncology, pathology, radiology, surgical, navigation, internal medicine, pharmacy, pulmonology, palliative care, research.  Victor Cortez currently presents as a current patient, for discussion with history of the following treatments: active survellience.  Additionally, we reviewed previous medical and familial history, history of present illness, and recent lab results along with all available histopathologic and imaging studies. The tumor board considered available treatment options and made the following recommendations: Additional screening, Active surveillance MRI of Gallbladder  The following procedures/referrals were also placed: No orders of the defined types were placed in this encounter.   Clinical Trial Status: not discussed   Staging used: Not Applicable  National site-specific guidelines   were discussed with respect to the case.  Tumor board is a meeting of clinicians from various specialty areas who evaluate and discuss patients for whom a multidisciplinary approach is being considered. Final determinations in the plan of care are those of the provider(s). The responsibility for follow up of recommendations given during tumor board is that of the provider.   Today's extended care, comprehensive team conference, Victor Cortez was not present for the discussion and was not examined.   Multidisciplinary Tumor Board is a multidisciplinary case peer review process.  Decisions discussed in the Multidisciplinary Tumor Board reflect the opinions of the specialists present at the conference without having examined the patient.  Ultimately, treatment and diagnostic decisions rest with the primary provider(s) and the patient.

## 2018-10-07 NOTE — Progress Notes (Signed)
Called patient no answer left message to call back and I will try again later

## 2018-10-08 ENCOUNTER — Other Ambulatory Visit: Payer: Self-pay

## 2018-10-08 ENCOUNTER — Inpatient Hospital Stay: Payer: Medicare Other | Attending: Oncology | Admitting: Oncology

## 2018-10-08 ENCOUNTER — Encounter: Payer: Self-pay | Admitting: Oncology

## 2018-10-08 ENCOUNTER — Ambulatory Visit: Payer: Medicare Other | Admitting: Pulmonary Disease

## 2018-10-08 VITALS — BP 147/83 | HR 69 | Temp 98.2°F | Resp 18 | Ht 66.93 in | Wt 160.5 lb

## 2018-10-08 DIAGNOSIS — M5136 Other intervertebral disc degeneration, lumbar region: Secondary | ICD-10-CM | POA: Insufficient documentation

## 2018-10-08 DIAGNOSIS — K8020 Calculus of gallbladder without cholecystitis without obstruction: Secondary | ICD-10-CM | POA: Diagnosis not present

## 2018-10-08 DIAGNOSIS — Z801 Family history of malignant neoplasm of trachea, bronchus and lung: Secondary | ICD-10-CM | POA: Diagnosis not present

## 2018-10-08 DIAGNOSIS — N200 Calculus of kidney: Secondary | ICD-10-CM | POA: Insufficient documentation

## 2018-10-08 DIAGNOSIS — N433 Hydrocele, unspecified: Secondary | ICD-10-CM | POA: Diagnosis not present

## 2018-10-08 DIAGNOSIS — I700 Atherosclerosis of aorta: Secondary | ICD-10-CM | POA: Insufficient documentation

## 2018-10-08 DIAGNOSIS — Z87891 Personal history of nicotine dependence: Secondary | ICD-10-CM | POA: Insufficient documentation

## 2018-10-08 DIAGNOSIS — Z825 Family history of asthma and other chronic lower respiratory diseases: Secondary | ICD-10-CM | POA: Diagnosis not present

## 2018-10-08 DIAGNOSIS — Z7901 Long term (current) use of anticoagulants: Secondary | ICD-10-CM | POA: Insufficient documentation

## 2018-10-08 DIAGNOSIS — R911 Solitary pulmonary nodule: Secondary | ICD-10-CM

## 2018-10-08 DIAGNOSIS — N400 Enlarged prostate without lower urinary tract symptoms: Secondary | ICD-10-CM | POA: Insufficient documentation

## 2018-10-08 DIAGNOSIS — R918 Other nonspecific abnormal finding of lung field: Secondary | ICD-10-CM | POA: Diagnosis not present

## 2018-10-08 DIAGNOSIS — Z79899 Other long term (current) drug therapy: Secondary | ICD-10-CM | POA: Diagnosis not present

## 2018-10-08 DIAGNOSIS — R948 Abnormal results of function studies of other organs and systems: Secondary | ICD-10-CM

## 2018-10-08 DIAGNOSIS — K760 Fatty (change of) liver, not elsewhere classified: Secondary | ICD-10-CM | POA: Diagnosis not present

## 2018-10-08 DIAGNOSIS — Z85828 Personal history of other malignant neoplasm of skin: Secondary | ICD-10-CM | POA: Diagnosis not present

## 2018-10-08 DIAGNOSIS — K5730 Diverticulosis of large intestine without perforation or abscess without bleeding: Secondary | ICD-10-CM | POA: Diagnosis not present

## 2018-10-08 NOTE — Progress Notes (Signed)
Patient here to discuss abnormal gallbladder finding on lung screening CT.

## 2018-10-09 NOTE — Progress Notes (Signed)
Hematology/Oncology Consult note [facility] Telephone:([phone] [phone]   Patient Care Team: Ria Bush, MD as PCP - General (Family Medicine) Telford Nab, RN as Registered Nurse  REFERRING PROVIDER: Ria Bush, MD  CHIEF COMPLAINTS/REASON FOR VISIT:  Evaluation of lung mass  HISTORY OF PRESENTING ILLNESS:   Victor Cortez is a  68 y.o.  male with PMH listed below was seen in consultation at the request of  Ria Bush, MD  for evaluation of lung mass. Patient is a former smoker, 106 pack year smoking history.  09/27/2018 lung cancer screening low-dose CT showed a suspicious new 11.1 mm nodular opacity in the central left lower lobe. Patient was referred to cancer center for further evaluation. PET scan was obtained on 10/04/2018 which showed interval near complete resolution of the left lower lobe pulmonary nodule with very minimal residual scarring.  No metabolic activity.  Likely an inflammatory lesion. Incidental finding of abnormal metabolic activity with maximum SUV 7.9 within [facility] the gallbladder.  Multiple gallstones. Possible soft tissue mass in the gallbladder.  Gallbladder malignancy is not excluded. Hypo-dense structure adjacent to the left adrenal gland, stable over 3 years not hypermetabolic.  Likely benign lesion.  Today patient was seen in the clinic, he reports doing well.  Denies any cough, shortness of breath, chest pain, abdominal pain, unintentional weight loss, fever or chills.   Review of Systems  Constitutional: Negative for appetite change, chills, fatigue, fever and unexpected weight change.  HENT:   Negative for hearing loss and voice change.   Eyes: Negative for eye problems and icterus.  Respiratory: Negative for chest tightness, cough and shortness of breath.   Cardiovascular: Negative for chest pain and leg swelling.  Gastrointestinal: Negative for abdominal distention and abdominal pain.   Endocrine: Negative for hot flashes.  Genitourinary: Negative for difficulty urinating, dysuria and frequency.   Musculoskeletal: Negative for arthralgias.  Skin: Negative for itching and rash.  Neurological: Negative for light-headedness and numbness.  Hematological: Negative for adenopathy. Does not bruise/bleed easily.  Psychiatric/Behavioral: Negative for confusion.    MEDICAL HISTORY:  Past Medical History:  Diagnosis Date  . Aortic atherosclerosis ([place])    by CT  . Arthritis   . Atrial fibrillation with RVR ([place]) 06/2013   during hospitalization  . Basal cell carcinoma of nose 1998  . CAD (coronary artery disease)    by CT  . Cataract   . Cholelithiasis 10/2017   by CT  . COPD (chronic obstructive pulmonary disease) ([place])   . Dysrhythmia   . ED (erectile dysfunction)   . Ex-smoker   . GERD (gastroesophageal reflux disease)   . Glaucoma    Dr. Matilde Sprang  . Glaucoma   . Hepatic steatosis    by CT  . History of chicken pox   . History of pneumonia 06/2013   with sepsis and [facility] hospitalization  . Hyperlipidemia   . Rosacea   . Seborrheic dermatitis     SURGICAL HISTORY: Past Surgical History:  Procedure Laterality Date  . CYSTOSCOPY WITH URETHRAL DILATATION  12/04/2017   Procedure: CYSTOSCOPY WITH URETHRAL DILATATION;  Surgeon: Billey Co, MD;  Location: [facility] ORS;  Service: Urology;;  . CYSTOSCOPY/URETEROSCOPY/HOLMIUM LASER/STENT PLACEMENT Left 12/04/2017   Procedure: CYSTOSCOPY/URETEROSCOPY/HOLMIUM LASER/STENT PLACEMENT;  Surgeon: Billey Co, MD;  Location: [facility] ORS;  Service: Urology;  Laterality: Left;  . ROTATOR CUFF REPAIR  2016    SOCIAL HISTORY: Social History   Socioeconomic History  . Marital status: Married  Spouse name: Not on file  . Number of children: 1  . Years of education: Not on file  . Highest education level: Not on file  Occupational History  . Not on file  Social Needs  . Financial resource strain: Not on file  . Food  insecurity    Worry: Not on file    Inability: Not on file  . Transportation needs    Medical: Not on file    Non-medical: Not on file  Tobacco Use  . Smoking status: Former Smoker    Packs/day: 2.00    Years: 53.00    Pack years: 106.00    Types: Cigarettes    Quit date: 06/03/2013    Years since quitting: 5.3  . Smokeless tobacco: Never Used  Substance and Sexual Activity  . Alcohol use: No  . Drug use: No  . Sexual activity: Not on file  Lifestyle  . Physical activity    Days per week: Not on file    Minutes per session: Not on file  . Stress: Not on file  Relationships  . Social Herbalist on phone: Not on file    Gets together: Not on file    Attends religious service: Not on file    Active member of club or organization: Not on file    Attends meetings of clubs or organizations: Not on file    Relationship status: Not on file  . Intimate partner violence    Fear of current or ex partner: Not on file    Emotionally abused: Not on file    Physically abused: Not on file    Forced sexual activity: Not on file  Other Topics Concern  . 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 COPD Father   . Cancer Father        lung (smoker)  . COPD Mother   . CAD Neg Hx   . Stroke Neg Hx   . Bladder Cancer Neg Hx   . Prostate cancer Neg Hx     ALLERGIES:  has No Known Allergies.  MEDICATIONS:  Current Outpatient Medications  Medication Sig Dispense Refill  . aspirin EC 81 MG tablet Take 81 mg by mouth daily. 1 tab by mouth in AM    . AZOPT 1 % ophthalmic suspension     . Cyanocobalamin (RA VITAMIN B-12 TR) 1000 MCG TBCR Take 1 tablet by mouth daily. One tab by mouth daily    .  Glucosamine-Chondroit-Vit C-Mn (GLUCOSAMINE 1500 COMPLEX) CAPS Take 1 capsule by mouth daily.    . Ipratropium-Albuterol (COMBIVENT RESPIMAT) 20-100 MCG/ACT AERS respimat Inhale 1 puff into the lungs every 6 (six) hours as needed for wheezing. 4 g 11  . losartan (COZAAR) 50 MG tablet     . Multiple Vitamin (MULTI-VITAMINS) TABS Take by mouth. 1 tab by mouth daily    . Omega-3 Fatty Acids (FISH OIL) 1000 MG CAPS Take 1 capsule by mouth daily. 1 cap by mouth daily    . pantoprazole (PROTONIX) 40 MG tablet Take 1 tablet (40 mg total) by mouth daily as needed. 30 tablet 3  . pravastatin (PRAVACHOL) 80 MG tablet Take  1 tablet (80 mg total) by mouth daily.    . rivaroxaban (XARELTO) 20 MG TABS tablet Take 20 mg by mouth daily with supper.    . sotalol (BETAPACE) 80 MG tablet Take 80 mg by mouth 2 (two) times daily. One tab by mouth twice a day    . tadalafil (ADCIRCA/CIALIS) 20 MG tablet Take 1 tablet (20 mg total) by mouth daily as needed for erectile dysfunction. 10 tablet 11  . latanoprost (XALATAN) 0.005 % ophthalmic solution Place 1 drop into both eyes at bedtime.    Marland Kitchen losartan (COZAAR) 25 MG tablet     . sulfamethoxazole-trimethoprim (BACTRIM DS,SEPTRA DS) 800-160 MG tablet Take 1 tablet by mouth daily. (Patient not taking: Reported on 10/08/2018) 9 tablet 0  . tamsulosin (FLOMAX) 0.4 MG CAPS capsule Take 1 capsule (0.4 mg total) by mouth daily after supper. (Patient not taking: Reported on 10/08/2018) 20 capsule 0   No current facility-administered medications for this visit.      PHYSICAL EXAMINATION: ECOG PERFORMANCE STATUS: 0 - Asymptomatic Vitals:   10/08/18 0904  BP: (!) 147/83  Pulse: 69  Resp: 18  Temp: 98.2 F (36.8 C)   Filed Weights   10/08/18 0904  Weight: 160 lb 8 oz (72.8 kg)    Physical Exam Constitutional:      General: He is not in acute distress. HENT:     Head: Normocephalic and atraumatic.  Eyes:     General: No scleral icterus.    Pupils: Pupils are equal,  round, and reactive to light.  Neck:     Musculoskeletal: Normal range of motion and neck supple.  Cardiovascular:     Rate and Rhythm: Normal rate and regular rhythm.     Heart sounds: Normal heart sounds.  Pulmonary:     Effort: Pulmonary effort is normal. No respiratory distress.     Breath sounds: No wheezing.  Abdominal:     General: Bowel sounds are normal. There is no distension.     Palpations: Abdomen is soft. There is no mass.     Tenderness: There is no abdominal tenderness.  Musculoskeletal: Normal range of motion.        General: No deformity.  Skin:    General: Skin is warm and dry.     Findings: No erythema or rash.  Neurological:     Mental Status: He is alert and oriented to person, place, and time.     Cranial Nerves: No cranial nerve deficit.     Coordination: Coordination normal.  Psychiatric:        Behavior: Behavior normal.        Thought Content: Thought content normal.     LABORATORY DATA:  I have reviewed the data as listed Lab Results  Component Value Date   WBC 6.8 11/16/2017   HGB 13.4 11/16/2017   HCT 39.8 11/16/2017   MCV 89.4 11/16/2017   PLT 289.0 11/16/2017   Recent Labs    10/20/17 1402 11/16/17 1538  NA  --  142  K  --  4.4  CL  --  103  CO2  --  33*  GLUCOSE  --  89  BUN 16 16  CREATININE 1.16 1.07  CALCIUM  --  9.6  GFRNONAA 64  --   GFRAA 74  --   PROT  --  6.7  ALBUMIN  --  4.0  AST  --  21  ALT  --  27  ALKPHOS  --  49  BILITOT  --  0.4   Iron/TIBC/Ferritin/ %Sat No results found for: IRON, TIBC, FERRITIN, IRONPCTSAT    RADIOGRAPHIC STUDIES: I have personally reviewed the radiological images as listed and agreed with the findings in the report.  Nm Pet Image Initial (pi) Skull Base To Thigh  Result Date: 10/04/2018 CLINICAL DATA:  Initial treatment strategy for lung nodule. EXAM: NUCLEAR MEDICINE PET SKULL BASE TO THIGH TECHNIQUE: 9.0 mCi F-18 FDG was injected intravenously. Full-ring PET imaging was  performed from the skull base to thigh after the radiotracer. CT data was obtained and used for attenuation correction and anatomic localization. Fasting blood glucose: 107 mg/dl COMPARISON:  Multiple exams, including Chest CT of 09/27/2018 FINDINGS: Mediastinal blood pool activity: SUV max 2.5 Liver activity: SUV max 3.1 NECK: No significant abnormal hypermetabolic activity in this region. Incidental CT findings: none CHEST: The previous left lower lobe pulmonary nodule has nearly completely resolved with only very faint linear residual opacity in this vicinity for example on image 128/3. No associated accentuated metabolic activity. 3 mm right upper lobe nodule on image 75/3, no accentuated metabolic activity but below sensitive PET-CT size thresholds. Incidental CT findings: Airway thickening is present, suggesting bronchitis or reactive airways disease. Mild scarring posteriorly in the lingula. ABDOMEN/PELVIS: Gallstones with gallbladder wall thickening and abnormal hypermetabolic activity in this vicinity, maximum SUV 7.9. Incidental CT findings: Oval-shaped 1.4 by 0.8 cm structure medial to the right adrenal gland and an adjacent slightly lobulated hypodense 3.0 by 1.6 cm structure along the lesser sac, neither hypermetabolic, both stable from 09/25/2015. Aortoiliac atherosclerotic vascular disease. Periampullary duodenal diverticulum without inflammatory findings. Scattered diverticula of the descending and sigmoid colon. Bilateral nonobstructive nephrolithiasis. Prostatomegaly. Large right hydrocele, 560 cubic cm. Aortoiliac atherosclerotic vascular disease. SKELETON: No significant abnormal hypermetabolic activity in this region. Incidental CT findings: Degenerative arthropathy of both hips. Lower lumbar degenerative facet arthropathy. IMPRESSION: 1. The left lower lobe pulmonary nodule of concern has nearly completely resolved, with only some very minimal residual scarring, and has no accentuated metabolic  activity. This was likely an inflammatory lesion. 2. However, there is abnormal metabolic activity with maximum SUV 7.9 within or along the gallbladder, with multiple gallstones. Possible soft tissue mass in the gallbladder. Gallbladder malignancy is not excluded, consider further workup by hepatic protocol MRI with and without contrast. 3. Hypodense structures adjacent to the left adrenal gland/lesser sac are unchanged over the last 3 years and not hypermetabolic, likely benign lesions such as lymphangioma. 4. Other imaging findings of potential clinical significance: 560 cubic cm right hydrocele. Airway thickening is present, suggesting bronchitis or reactive airways disease. Bilateral nonobstructive nephrolithiasis. Prostatomegaly. Aortic Atherosclerosis (ICD10-I70.0). Electronically Signed   By: Van Clines M.D.   On: 10/04/2018 10:56   Ct Chest Lung Cancer Screening Low Dose Wo Contrast  Result Date: 09/28/2018 CLINICAL DATA:  67 year old male former smoker, quit 5 years ago, 106 pack-year history of smoking, for follow-up lung cancer screening EXAM: CT CHEST WITHOUT CONTRAST LOW-DOSE FOR LUNG CANCER SCREENING TECHNIQUE: Multidetector CT imaging of the chest was performed following the standard protocol without IV contrast. COMPARISON:  Low-dose lung cancer screening CT chest dated 09/24/2017 FINDINGS: Cardiovascular: Heart is normal in size.  No pericardial effusion. No evidence of thoracic aortic aneurysm. Mild atherosclerotic calcifications of the aortic arch. Mild three-vessel coronary atherosclerosis. Mediastinum/Nodes: No suspicious mediastinal lymphadenopathy. Visualized thyroid is unremarkable. Lungs/Pleura: Mild centrilobular emphysematous changes. Mild lingular scarring/atelectasis. No focal consolidation. Scattered bilateral pulmonary nodules measuring up to 3.6 mm in the right upper  lobe which are unchanged. However, there is a new 11.1 mm nodular opacity in the central left lower lobe  (image 241) which is new. No pleural effusion or pneumothorax. Upper Abdomen: Visualized upper abdomen is notable for cholelithiasis, vascular calcifications, and bilateral nonobstructing renal calculi measuring up to 5 mm in the right lower pole. Musculoskeletal: Degenerative changes of the lower thoracic spine. IMPRESSION: Lung-RADS 4B, suspicious. Additional imaging evaluation or consultation with Pulmonology or Thoracic Surgery recommended. New 11.1 mm nodular opacity in the central left lower lobe. Consider follow up low-dose chest CT without contrast in 3 months (please use the following order, "CT CHEST LCS NODULE FOLLOW-UP W/O CM") or PET-CT. These results will be called to the ordering clinician or representative by the Radiologist Assistant, and communication documented in the PACS or zVision Dashboard. Aortic Atherosclerosis (ICD10-I70.0) and Emphysema (ICD10-J43.9). Electronically Signed   By: Julian Hy M.D.   On: 09/28/2018 08:47      ASSESSMENT & PLAN:  1. Lung nodule   2. Abnormal positron emission tomography (PET) scan    CT images and PET scan images were independently reviewed by me and discussed with patient. Lung nodule has complete resolution on PET scan and has no metabolic activity likely reflecting a reactive process. Former smoker, recommend continue lung cancer screening program.  Increased metabolic activity in the gallbladder, reactive versus malignancy.  His case was discussed on tumor board on 10/07/2018 and consensus reached upon proceeding with MRI of the liver for further evaluation. Patient agrees with the plan. Further management plan depending on MRI liver results. All questions were answered. The patient knows to call the clinic with any problems questions or concerns. Follow-up to be determined. cc Ria Bush, MD    Return of visit:  Thank you for this kind referral and the opportunity to participate in the care of this patient. A copy of  today's note is routed to referring provider  Total face to face encounter time for this patient visit was 45 min. >50% of the time was  spent in counseling and coordination of care.    Earlie Server, MD, PhD Hematology Oncology [facility] Pager- [phone] 10/09/2018

## 2018-10-12 ENCOUNTER — Encounter: Payer: Self-pay | Admitting: *Deleted

## 2018-10-12 NOTE — Progress Notes (Signed)
  Oncology Nurse Navigator Documentation  Navigator Location: [facility] Onc (10/12/18 1300)   )Navigator Encounter Type: Telephone (10/12/18 1300) Telephone: Outgoing Call (10/12/18 1300)                       Barriers/Navigation Needs: No Barriers At This Time (10/12/18 1300)   Interventions: None Required (10/12/18 1300)      phone call made to patient to follow up med-onc visit with Dr. Tasia Catchings on Friday 9/4. All questions answered during phone call. Reviewed upcoming appts. Instructed to call with any further questions or needs. Pt verbalized understanding. Nothing further needed at this time.               Time Spent with Patient: 30 (10/12/18 1300)

## 2018-10-15 ENCOUNTER — Telehealth: Payer: Self-pay

## 2018-10-15 NOTE — Telephone Encounter (Signed)
Santiago Glad calling to confirm that it is liver MRI w/wo and not an abdomen MRI w/wo with the dx of thickening wall of gallbladder.

## 2018-10-15 NOTE — Telephone Encounter (Signed)
Yes. Liver MRI, which was recommended by radiology. Thanks.

## 2018-10-15 NOTE — Telephone Encounter (Signed)
LM informing Santiago Glad MRI ordered is correct.

## 2018-10-17 ENCOUNTER — Ambulatory Visit
Admission: RE | Admit: 2018-10-17 | Discharge: 2018-10-17 | Disposition: A | Discharge status: Home / Self Care | Payer: Medicare Other | Source: Ambulatory Visit | Attending: Oncology | Admitting: Oncology

## 2018-10-17 DIAGNOSIS — K828 Other specified diseases of gallbladder: Secondary | ICD-10-CM | POA: Insufficient documentation

## 2018-10-17 DIAGNOSIS — R948 Abnormal results of function studies of other organs and systems: Secondary | ICD-10-CM | POA: Insufficient documentation

## 2018-10-17 DIAGNOSIS — K8020 Calculus of gallbladder without cholecystitis without obstruction: Secondary | ICD-10-CM | POA: Diagnosis not present

## 2018-10-17 LAB — POCT I-STAT CREATININE: Creatinine, Ser: 1.5 mg/dL — ABNORMAL HIGH (ref 0.61–1.24)

## 2018-10-17 MED ORDER — GADOBUTROL 1 MMOL/ML IV SOLN
7.0000 mL | Freq: Once | INTRAVENOUS | Status: AC | PRN
  Administered 2018-10-17: 7 mL via INTRAVENOUS

## 2018-10-18 ENCOUNTER — Encounter: Payer: Self-pay | Admitting: *Deleted

## 2018-10-18 DIAGNOSIS — K8020 Calculus of gallbladder without cholecystitis without obstruction: Secondary | ICD-10-CM

## 2018-10-18 NOTE — Progress Notes (Signed)
  Oncology Nurse Navigator Documentation  Navigator Location: [facility] Onc (10/18/18 1400)   )Navigator Encounter Type: Telephone (10/18/18 1400) Telephone: Patient Update;Outgoing Call (10/18/18 1400)                       Barriers/Navigation Needs: Coordination of Care (10/18/18 1400)   Interventions: Coordination of Care (10/18/18 1400)   Coordination of Care: Appts (10/18/18 1400)       phone call made to patient to review results from MRI. Per Dr. Tasia Catchings, pt does not need to follow up with her at this time but she would like for him to see Dr. Dahlia Byes to further discuss cholelithiasis. Referral has been placed. Pt made aware that he should hear from their office with that appt. Also, made pt aware that Raquel Sarna will coordinate his next lung screening study. Instructed pt to call with any further questions or needs. Nothing further needed at this time.            Time Spent with Patient: 30 (10/18/18 1400)

## 2018-10-25 DIAGNOSIS — I4891 Unspecified atrial fibrillation: Secondary | ICD-10-CM | POA: Diagnosis not present

## 2018-10-25 DIAGNOSIS — R0602 Shortness of breath: Secondary | ICD-10-CM | POA: Diagnosis not present

## 2018-10-25 DIAGNOSIS — I2510 Atherosclerotic heart disease of native coronary artery without angina pectoris: Secondary | ICD-10-CM | POA: Diagnosis not present

## 2018-10-25 DIAGNOSIS — I10 Essential (primary) hypertension: Secondary | ICD-10-CM | POA: Diagnosis not present

## 2018-10-25 DIAGNOSIS — E782 Mixed hyperlipidemia: Secondary | ICD-10-CM | POA: Diagnosis not present

## 2018-10-27 ENCOUNTER — Ambulatory Visit (INDEPENDENT_AMBULATORY_CARE_PROVIDER_SITE_OTHER): Payer: Medicare Other | Admitting: Surgery

## 2018-10-27 ENCOUNTER — Other Ambulatory Visit: Payer: Self-pay

## 2018-10-27 ENCOUNTER — Encounter: Payer: Self-pay | Admitting: Surgery

## 2018-10-27 VITALS — BP 132/74 | HR 76 | Temp 97.8°F | Ht 66.0 in | Wt 160.0 lb

## 2018-10-27 DIAGNOSIS — K8070 Calculus of gallbladder and bile duct without cholecystitis without obstruction: Secondary | ICD-10-CM

## 2018-10-27 NOTE — Patient Instructions (Signed)
Return as needed.The patient is aware to call back for any questions or concerns.  

## 2018-10-29 ENCOUNTER — Encounter: Payer: Self-pay | Admitting: Surgery

## 2018-10-29 ENCOUNTER — Other Ambulatory Visit: Payer: Self-pay

## 2018-10-29 ENCOUNTER — Telehealth: Payer: Self-pay

## 2018-10-29 DIAGNOSIS — D1809 Hemangioma of other sites: Secondary | ICD-10-CM

## 2018-10-29 NOTE — Telephone Encounter (Signed)
Per Dr.Pabon-referral placed to Dr.Anna, patient notified.

## 2018-10-29 NOTE — Progress Notes (Signed)
Patient ID: Victor Cortez, male   DOB: 1950/05/24, 68 y.o.   MRN: [id number]  HPI Victor Cortez is a 68 y.o. male seen in consultation at the request of Dr. Tasia Catchings for cholelithiasis.  He does have a history of smoking of part of the screening process he had a CT scan of the chest and abdomen for potential mass within the gallbladder liver was not excluded and this prompted an MRI showing evidence of cholelithiasis without gallbladder cancer or liver cancer, small lymphangioma within liver.  Please note I have personally reviewed the images.  He reports no evidence of abdominal pain no nausea or vomiting no evidence of obstructive jaundice.  He does have some dyspnea on exertion related to his COPD.  HPI  Past Medical History:  Diagnosis Date  . Aortic atherosclerosis ([place])    by CT  . Arthritis   . Atrial fibrillation with RVR ([place]) 06/2013   during hospitalization  . Basal cell carcinoma of nose 1998  . CAD (coronary artery disease)    by CT  . Cataract   . Cholelithiasis 10/2017   by CT  . COPD (chronic obstructive pulmonary disease) ([place])   . Dysrhythmia   . ED (erectile dysfunction)   . Ex-smoker   . GERD (gastroesophageal reflux disease)   . Glaucoma    Dr. Matilde Sprang  . Glaucoma   . Hepatic steatosis    by CT  . History of chicken pox   . History of pneumonia 06/2013   with sepsis and [facility] hospitalization  . Hyperlipidemia   . Rosacea   . Seborrheic dermatitis     Past Surgical History:  Procedure Laterality Date  . CYSTOSCOPY WITH URETHRAL DILATATION  12/04/2017   Procedure: CYSTOSCOPY WITH URETHRAL DILATATION;  Surgeon: Billey Co, MD;  Location: [facility] ORS;  Service: Urology;;  . CYSTOSCOPY/URETEROSCOPY/HOLMIUM LASER/STENT PLACEMENT Left 12/04/2017   Procedure: CYSTOSCOPY/URETEROSCOPY/HOLMIUM LASER/STENT PLACEMENT;  Surgeon: Billey Co, MD;  Location: [facility] ORS;  Service: Urology;  Laterality: Left;  . ROTATOR CUFF REPAIR  2016    Family History  Problem Relation  Age of Onset  . COPD Father   . Cancer Father        lung (smoker)  . COPD Mother   . CAD Neg Hx   . Stroke Neg Hx   . Bladder Cancer Neg Hx   . Prostate cancer Neg Hx     Social History Social History   Tobacco Use  . Smoking status: Former Smoker    Packs/day: 2.00    Years: 53.00    Pack years: 106.00    Types: Cigarettes    Quit date: 06/03/2013    Years since quitting: 5.4  . Smokeless tobacco: Never Used  Substance Use Topics  . Alcohol use: No  . Drug use: No    No Known Allergies  Current Outpatient Medications  Medication Sig Dispense Refill  . aspirin EC 81 MG tablet Take 81 mg by mouth daily. 1 tab by mouth in AM    . AZOPT 1 % ophthalmic suspension     . Cyanocobalamin (RA VITAMIN B-12 TR) 1000 MCG TBCR Take 1 tablet by mouth daily. One tab by mouth daily    . Glucosamine-Chondroit-Vit C-Mn (GLUCOSAMINE 1500 COMPLEX) CAPS Take 1 capsule by mouth daily.    . Ipratropium-Albuterol (COMBIVENT RESPIMAT) 20-100 MCG/ACT AERS respimat Inhale 1 puff into the lungs every 6 (six) hours as needed for wheezing. 4 g 11  . latanoprost (XALATAN) 0.005 %  ophthalmic solution Place 1 drop into both eyes at bedtime.    Marland Kitchen losartan (COZAAR) 25 MG tablet     . losartan (COZAAR) 50 MG tablet     . Multiple Vitamin (MULTI-VITAMINS) TABS Take by mouth. 1 tab by mouth daily    . Omega-3 Fatty Acids (FISH OIL) 1000 MG CAPS Take 1 capsule by mouth daily. 1 cap by mouth daily    . pravastatin (PRAVACHOL) 80 MG tablet Take 1 tablet (80 mg total) by mouth daily.    . rivaroxaban (XARELTO) 20 MG TABS tablet Take 20 mg by mouth daily with supper.    . sotalol (BETAPACE) 80 MG tablet Take 80 mg by mouth 2 (two) times daily. One tab by mouth twice a day    . tadalafil (ADCIRCA/CIALIS) 20 MG tablet Take 1 tablet (20 mg total) by mouth daily as needed for erectile dysfunction. 10 tablet 11   No current facility-administered medications for this visit.      Review of Systems Full ROS  was  asked and was negative except for the information on the HPI  Physical Exam Blood pressure 132/74, pulse 76, temperature 97.8 F (36.6 C), temperature source Skin, height 5\' 6"  (1.676 m), weight 160 lb (72.6 kg), SpO2 98 %. CONSTITUTIONAL: NAD EYES: Pupils are equal, round, and reactive to light, Sclera are non-icteric. EARS, NOSE, MOUTH AND THROAT: The oropharynx is clear. The oral mucosa is pink and moist. Hearing is intact to voice. LYMPH NODES:  Lymph nodes in the neck are normal. RESPIRATORY:  Lungs some chronic wheezing. There is normal respiratory effort, with equal breath sounds bilaterally, and without pathologic use of accessory muscles. CARDIOVASCULAR: Heart is regular without murmurs, gallops, or rubs. GI: The abdomen is  soft, nontender, and nondistended. There are no palpable masses. There is no hepatosplenomegaly. There are normal bowel sounds in all quadrants. GU: Rectal deferred.   MUSCULOSKELETAL: Normal muscle strength and tone. No cyanosis or edema.   SKIN: Turgor is good and there are no pathologic skin lesions or ulcers. NEUROLOGIC: Motor and sensation is grossly normal. Cranial nerves are grossly intact. PSYCH:  Oriented to person, place and time. Affect is normal.  Data Reviewed  I have personally reviewed the patient's imaging, laboratory findings and medical records.    Assessment/Plan Incidental cholelithiasis.  I had an extensive discussion with the patient regarding these findings.  Discussed with him the natural history of gallstones.  We typically do not do cholecystectomy unless they become symptomatic or cause some complications such as pancreatitis or choledocholithiasis.  Currently the patient seems to be asymptomatic and does not have any complications.  No need for surgical intervention at this time.  We will follow him as needed Regarding the small hemangioma within the stomach I will refer this to GI for endoscopic evaluation to make sure there is  nothing to be concerned about.   Caroleen Hamman, MD FACS General Surgeon 10/29/2018, 2:39 PM

## 2018-11-01 ENCOUNTER — Other Ambulatory Visit: Payer: Self-pay

## 2018-11-01 DIAGNOSIS — Z20822 Contact with and (suspected) exposure to covid-19: Secondary | ICD-10-CM

## 2018-11-02 LAB — NOVEL CORONAVIRUS, NAA: SARS-CoV-2, NAA: NOT DETECTED

## 2018-11-04 ENCOUNTER — Telehealth: Payer: Self-pay

## 2018-11-04 NOTE — Telephone Encounter (Signed)
Pt. Called back, given COVID 19 results. 

## 2018-11-16 DIAGNOSIS — Z23 Encounter for immunization: Secondary | ICD-10-CM | POA: Diagnosis not present

## 2018-11-29 ENCOUNTER — Other Ambulatory Visit: Payer: Self-pay

## 2018-11-29 ENCOUNTER — Ambulatory Visit (INDEPENDENT_AMBULATORY_CARE_PROVIDER_SITE_OTHER): Payer: Medicare Other | Admitting: Gastroenterology

## 2018-11-29 VITALS — BP 147/81 | HR 61 | Temp 97.9°F | Ht 66.0 in | Wt 159.0 lb

## 2018-11-29 DIAGNOSIS — R935 Abnormal findings on diagnostic imaging of other abdominal regions, including retroperitoneum: Secondary | ICD-10-CM

## 2018-11-29 NOTE — Progress Notes (Signed)
Victor Bellows MD, MRCP(U.K) [address]  [place]  [place], [place] [address]  Main: [phone]  Fax: [phone]   Gastroenterology Consultation  Referring Provider:     Jules Husbands, MD Primary Care Physician:  Ria Bush, MD Primary Gastroenterologist:  Dr. Jonathon Cortez Reason for Consultation:     Hemangioma of the stomach        HPI:   Victor Cortez is a 68 y.o. 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 Dahlia Byes.  Recently had an MRI liver with and without contrast that demonstrated in the upper abdomen immediately inferior to the proximal stomach a multi lobar T1 hypointense T2 hyperintense nonenhancing lesion similar to examinations back in 2017 favoring to represent a benign lesion such as a small lymphangioma.  He has been referred to me for endoscopic evaluation. He denies any abdominal pain, weight loss.  Ex-smoker quit 5 years back.  Father had lung cancer but he was a smoker.  He is on Xarelto Past Medical History:  Diagnosis Date  . Aortic atherosclerosis ([place])    by CT  . Arthritis   . Atrial fibrillation with RVR ([place]) 06/2013   during hospitalization  . Basal cell carcinoma of nose 1998  . CAD (coronary artery disease)    by CT  . Cataract   . Cholelithiasis 10/2017   by CT  . COPD (chronic obstructive pulmonary disease) ([place])   . Dysrhythmia   . ED (erectile dysfunction)   . Ex-smoker   . GERD (gastroesophageal reflux disease)   . Glaucoma    Dr. Matilde Sprang  . Glaucoma   . Hepatic steatosis    by CT  . History of chicken pox   . History of pneumonia 06/2013   with sepsis and [facility] hospitalization  . Hyperlipidemia   . Rosacea   . Seborrheic dermatitis     Past Surgical History:  Procedure Laterality Date  . CYSTOSCOPY WITH URETHRAL DILATATION  12/04/2017   Procedure: CYSTOSCOPY WITH URETHRAL DILATATION;  Surgeon: Billey Co, MD;  Location: [facility] ORS;  Service: Urology;;  . CYSTOSCOPY/URETEROSCOPY/HOLMIUM LASER/STENT PLACEMENT Left  12/04/2017   Procedure: CYSTOSCOPY/URETEROSCOPY/HOLMIUM LASER/STENT PLACEMENT;  Surgeon: Billey Co, MD;  Location: [facility] ORS;  Service: Urology;  Laterality: Left;  . ROTATOR CUFF REPAIR  2016    Prior to Admission medications   Medication Sig Start Date End Date Taking? Authorizing Provider  aspirin EC 81 MG tablet Take 81 mg by mouth daily. 1 tab by mouth in AM    [provider]  AZOPT 1 % ophthalmic suspension  08/16/18   [provider]  Cyanocobalamin (RA VITAMIN B-12 TR) 1000 MCG TBCR Take 1 tablet by mouth daily. One tab by mouth daily    [provider]  Glucosamine-Chondroit-Vit C-Mn (GLUCOSAMINE 1500 COMPLEX) CAPS Take 1 capsule by mouth daily.    [provider]  Ipratropium-Albuterol (COMBIVENT RESPIMAT) 20-100 MCG/ACT AERS respimat Inhale 1 puff into the lungs every 6 (six) hours as needed for wheezing. 11/16/17   Ria Bush, MD  latanoprost (XALATAN) 0.005 % ophthalmic solution Place 1 drop into both eyes at bedtime. 05/15/13   [provider]  losartan (COZAAR) 25 MG tablet  11/25/17   [provider]  losartan (COZAAR) 50 MG tablet  07/19/18   [provider]  Multiple Vitamin (MULTI-VITAMINS) TABS Take by mouth. 1 tab by mouth daily    [provider]  Omega-3 Fatty Acids (FISH OIL) 1000 MG CAPS Take 1 capsule  by mouth daily. 1 cap by mouth daily    [provider]  pravastatin (PRAVACHOL) 80 MG tablet Take 1 tablet (80 mg total) by mouth daily. 10/01/17   Ria Bush, MD  rivaroxaban (XARELTO) 20 MG TABS tablet Take 20 mg by mouth daily with supper.    [provider]  sotalol (BETAPACE) 80 MG tablet Take 80 mg by mouth 2 (two) times daily. One tab by mouth twice a day 06/07/13   [provider]  tadalafil (ADCIRCA/CIALIS) 20 MG tablet Take 1 tablet (20 mg total) by mouth daily as needed for erectile dysfunction. 01/14/18   Billey Co, MD    Family History   Problem Relation Age of Onset  . COPD Father   . Cancer Father        lung (smoker)  . COPD Mother   . CAD Neg Hx   . Stroke Neg Hx   . Bladder Cancer Neg Hx   . Prostate cancer Neg Hx      Social History   Tobacco Use  . Smoking status: Former Smoker    Packs/day: 2.00    Years: 53.00    Pack years: 106.00    Types: Cigarettes    Quit date: 06/03/2013    Years since quitting: 5.4  . Smokeless tobacco: Never Used  Substance Use Topics  . Alcohol use: No  . Drug use: No    Allergies as of 11/29/2018  . (No Known Allergies)    Review of Systems:    All systems reviewed and negative except where noted in HPI.   Physical Exam:  There were no vitals taken for this visit. No LMP for male patient. Psych:  Alert and cooperative. Normal mood and affect. General:   Alert,  Well-developed, well-nourished, pleasant and cooperative in NAD Head:  Normocephalic and atraumatic. Eyes:  Sclera clear, no icterus.   Conjunctiva pink. Ears:  Normal auditory acuity. Nose:  No deformity, discharge, or lesions. Mouth:  No deformity or lesions,oropharynx pink & moist. Neck:  Supple; no masses or thyromegaly. Lungs:  Respirations even and unlabored.  Clear throughout to auscultation.   No wheezes, crackles, or rhonchi. No acute distress. Heart:  Regular rate and rhythm; no murmurs, clicks, rubs, or gallops. Abdomen:  Normal bowel sounds.  No bruits.  Soft, non-tender and non-distended without masses, hepatosplenomegaly or hernias noted.  No guarding or rebound tenderness.    Neurologic:  Alert and oriented x3;  grossly normal neurologically. Skin:  Intact without significant lesions or rashes. No jaundice. Lymph Nodes:  No significant cervical adenopathy. Psych:  Alert and cooperative. Normal mood and affect.  Imaging Studies: No results found.  Assessment and Plan:   Victor Cortez is a 68 y.o. y/o male has been referred for possible lymphangioma of the stomach seen on recent MRI.   Per radiologist appears to be stable when compared to the prior imaging.  Has been referred for endoscopic evaluation.  Plan 1 EGD with Xarelto holding instructions   I have discussed alternative options, risks & benefits,  which include, but are not limited to, bleeding, infection, perforation,respiratory complication & drug reaction.  The patient agrees with this plan & written consent will be obtained.    Follow up in prn  Dr Victor Bellows MD,MRCP(U.K)

## 2018-12-03 ENCOUNTER — Telehealth: Payer: Self-pay

## 2018-12-03 NOTE — Telephone Encounter (Signed)
Called and left a message for call back to scheduled procedure with Dr. Vicente Males. Tried mobile number and voicemail is not set up. We got blood thinner request back.

## 2018-12-06 ENCOUNTER — Other Ambulatory Visit: Payer: Self-pay | Admitting: Family Medicine

## 2018-12-16 ENCOUNTER — Other Ambulatory Visit: Payer: Self-pay

## 2018-12-16 DIAGNOSIS — R935 Abnormal findings on diagnostic imaging of other abdominal regions, including retroperitoneum: Secondary | ICD-10-CM

## 2018-12-16 NOTE — Telephone Encounter (Signed)
Spoke with pt yesterday and was able to proceed with scheduling upper endoscopy procedure. I informed pt that his cardiologist gave instructions to hold the Xarelto for 5 days.

## 2018-12-17 ENCOUNTER — Other Ambulatory Visit
Admission: RE | Admit: 2018-12-17 | Discharge: 2018-12-17 | Disposition: A | Discharge status: Home / Self Care | Payer: Medicare Other | Source: Ambulatory Visit | Attending: Gastroenterology | Admitting: Gastroenterology

## 2018-12-17 ENCOUNTER — Other Ambulatory Visit: Payer: Self-pay

## 2018-12-17 DIAGNOSIS — Z01812 Encounter for preprocedural laboratory examination: Secondary | ICD-10-CM | POA: Diagnosis not present

## 2018-12-17 DIAGNOSIS — Z20828 Contact with and (suspected) exposure to other viral communicable diseases: Secondary | ICD-10-CM | POA: Insufficient documentation

## 2018-12-17 LAB — SARS CORONAVIRUS 2 (TAT 6-24 HRS): SARS Coronavirus 2: NEGATIVE

## 2018-12-20 MED ORDER — SODIUM CHLORIDE 0.9 % IV SOLN
INTRAVENOUS | Status: DC
  Administered 2018-12-21: 10:00:00 1000 mL via INTRAVENOUS

## 2018-12-21 ENCOUNTER — Ambulatory Visit
Admission: RE | Admit: 2018-12-21 | Discharge: 2018-12-21 | Disposition: A | Discharge status: Home / Self Care | Payer: Medicare Other | Attending: Gastroenterology | Admitting: Gastroenterology

## 2018-12-21 ENCOUNTER — Ambulatory Visit: Payer: Medicare Other | Admitting: Anesthesiology

## 2018-12-21 ENCOUNTER — Other Ambulatory Visit: Payer: Self-pay

## 2018-12-21 ENCOUNTER — Encounter: Payer: Self-pay | Admitting: *Deleted

## 2018-12-21 ENCOUNTER — Encounter
Admission: RE | Disposition: A | Discharge status: Home / Self Care | Payer: Self-pay | Source: Home / Self Care | Attending: Gastroenterology

## 2018-12-21 DIAGNOSIS — K760 Fatty (change of) liver, not elsewhere classified: Secondary | ICD-10-CM | POA: Diagnosis not present

## 2018-12-21 DIAGNOSIS — N529 Male erectile dysfunction, unspecified: Secondary | ICD-10-CM | POA: Insufficient documentation

## 2018-12-21 DIAGNOSIS — Z85828 Personal history of other malignant neoplasm of skin: Secondary | ICD-10-CM | POA: Insufficient documentation

## 2018-12-21 DIAGNOSIS — R935 Abnormal findings on diagnostic imaging of other abdominal regions, including retroperitoneum: Secondary | ICD-10-CM | POA: Insufficient documentation

## 2018-12-21 DIAGNOSIS — E785 Hyperlipidemia, unspecified: Secondary | ICD-10-CM | POA: Insufficient documentation

## 2018-12-21 DIAGNOSIS — H409 Unspecified glaucoma: Secondary | ICD-10-CM | POA: Diagnosis not present

## 2018-12-21 DIAGNOSIS — K2980 Duodenitis without bleeding: Secondary | ICD-10-CM | POA: Diagnosis not present

## 2018-12-21 DIAGNOSIS — K3189 Other diseases of stomach and duodenum: Secondary | ICD-10-CM | POA: Diagnosis not present

## 2018-12-21 DIAGNOSIS — K219 Gastro-esophageal reflux disease without esophagitis: Secondary | ICD-10-CM | POA: Diagnosis not present

## 2018-12-21 DIAGNOSIS — I2510 Atherosclerotic heart disease of native coronary artery without angina pectoris: Secondary | ICD-10-CM | POA: Diagnosis not present

## 2018-12-21 DIAGNOSIS — I4891 Unspecified atrial fibrillation: Secondary | ICD-10-CM | POA: Diagnosis not present

## 2018-12-21 DIAGNOSIS — Z79899 Other long term (current) drug therapy: Secondary | ICD-10-CM | POA: Diagnosis not present

## 2018-12-21 DIAGNOSIS — Z7982 Long term (current) use of aspirin: Secondary | ICD-10-CM | POA: Diagnosis not present

## 2018-12-21 DIAGNOSIS — Z801 Family history of malignant neoplasm of trachea, bronchus and lung: Secondary | ICD-10-CM | POA: Diagnosis not present

## 2018-12-21 DIAGNOSIS — Z7901 Long term (current) use of anticoagulants: Secondary | ICD-10-CM | POA: Insufficient documentation

## 2018-12-21 DIAGNOSIS — Z87891 Personal history of nicotine dependence: Secondary | ICD-10-CM | POA: Diagnosis not present

## 2018-12-21 DIAGNOSIS — M1990 Unspecified osteoarthritis, unspecified site: Secondary | ICD-10-CM | POA: Insufficient documentation

## 2018-12-21 DIAGNOSIS — J449 Chronic obstructive pulmonary disease, unspecified: Secondary | ICD-10-CM | POA: Diagnosis not present

## 2018-12-21 HISTORY — PX: ESOPHAGOGASTRODUODENOSCOPY (EGD) WITH PROPOFOL: SHX5813

## 2018-12-21 SURGERY — ESOPHAGOGASTRODUODENOSCOPY (EGD) WITH PROPOFOL
Anesthesia: General

## 2018-12-21 MED ORDER — PROPOFOL 10 MG/ML IV BOLUS
INTRAVENOUS | Status: DC | PRN
  Administered 2018-12-21: 10 mg via INTRAVENOUS
  Administered 2018-12-21: 70 mg via INTRAVENOUS
  Administered 2018-12-21: 30 mg via INTRAVENOUS
  Administered 2018-12-21: 10 mg via INTRAVENOUS

## 2018-12-21 NOTE — Anesthesia Post-op Follow-up Note (Signed)
Anesthesia QCDR form completed.        

## 2018-12-21 NOTE — Anesthesia Preprocedure Evaluation (Signed)
Anesthesia Evaluation  Patient identified by MRN, date of birth, ID band Patient awake    Reviewed: Allergy & Precautions, H&P , NPO status , Patient's Chart, lab work & pertinent test results  History of Anesthesia Complications Negative for: history of anesthetic complications  Airway Mallampati: II  TM Distance: >3 FB Neck ROM: limited    Dental  (+) Missing, Chipped, Poor Dentition   Pulmonary neg sleep apnea, COPD,  COPD inhaler, Not current smoker, former smoker,           Cardiovascular (-) hypertension+ CAD  (-) Past MI and (-) CHF + dysrhythmias Atrial Fibrillation (-) Valvular Problems/Murmurs     Neuro/Psych neg Seizures negative psych ROS   GI/Hepatic Neg liver ROS, GERD  Medicated,  Endo/Other  neg diabetes  Renal/GU Renal disease     Musculoskeletal  (+) Arthritis ,   Abdominal   Peds  Hematology negative hematology ROS (+)   Anesthesia Other Findings  No date: Cataract 10/2017: Cholelithiasis     Comment:  by CT No date: COPD (chronic obstructive pulmonary disease) ([facility]) No date: Dysrhythmia No date: ED (erectile dysfunction) No date: Ex-smokerPast Medical History: No date: Aortic atherosclerosis ([facility])     Comment:  by CT No date: Arthritis 06/2013: Atrial fibrillation with RVR ([facility])     Comment:  during hospitalization 1998: Basal cell carcinoma of nose No date: CAD (coronary artery disease)     Comment:  by CT No date: GERD (gastroesophageal reflux disease) No date: Glaucoma     Comment:  Dr. Matilde Sprang No date: Glaucoma No date: Hepatic steatosis     Comment:  by CT No date: History of chicken pox 06/2013: History of pneumonia     Comment:  with sepsis and [facility] hospitalization No date: Hyperlipidemia No date: Rosacea No date: Seborrheic dermatitis  Past Surgical History: 2016: ROTATOR CUFF REPAIR  BMI    Body Mass Index:  25.82 kg/m      Reproductive/Obstetrics negative OB  ROS                             Anesthesia Physical  Anesthesia Plan  ASA: III  Anesthesia Plan: General   Post-op Pain Management:    Induction: Intravenous  PONV Risk Score and Plan:   Airway Management Planned: Natural Airway and Nasal Cannula  Additional Equipment:   Intra-op Plan:   Post-operative Plan:   Informed Consent: I have reviewed the patients History and Physical, chart, labs and discussed the procedure including the risks, benefits and alternatives for the proposed anesthesia with the patient or authorized representative who has indicated his/her understanding and acceptance.     Dental Advisory Given  Plan Discussed with: Anesthesiologist, CRNA and Surgeon  Anesthesia Plan Comments: (Patient consented for risks of anesthesia including but not limited to:  - adverse reactions to medications - risk of intubation if required - damage to teeth, lips or other oral mucosa - sore throat or hoarseness - Damage to heart, brain, lungs or loss of life  Patient voiced understanding.)        Anesthesia Quick Evaluation

## 2018-12-21 NOTE — Op Note (Signed)
[facility] Gastroenterology Patient Name: Victor Cortez Procedure Date: 12/21/2018 10:24 AM MRN: [id number] Account #: [id number] Date of Birth: July 15, 1950 Admit Type: Outpatient Age: 68 Room: [facility] ENDO ROOM 1 Gender: Male Note Status: Finalized Procedure:             Upper GI endoscopy Indications:           Abnormal MRI of the GI tract Providers:             [name] MD, MD Medicines:             Monitored Anesthesia Care Complications:         No immediate complications. Procedure:             Pre-Anesthesia Assessment:                        - ASA Grade Assessment: II - A patient with mild                         systemic disease.                        - After reviewing the risks and benefits, the patient                         was deemed in satisfactory condition to undergo the                         procedure.                        After obtaining informed consent, the endoscope was                         passed under direct vision. Throughout the procedure,                         the patient's blood pressure, pulse, and oxygen                         saturations were monitored continuously. The Endoscope                         was introduced through the mouth, and advanced to the                         third part of duodenum. The upper GI endoscopy was                         accomplished with ease. The patient tolerated the                         procedure well. Findings:      The examined esophagus was normal.      The stomach was normal.      The cardia and gastric fundus were normal on retroflexion.      Localized moderate mucosal changes characterized by erythema, erosion,       inflammation and ulceration were found in the first portion of the       duodenum. Biopsies were taken with a  cold forceps for histology.      The cardia and gastric fundus were normal on retroflexion. Impression:            - Normal esophagus.       - Normal stomach.                        - Mucosal changes in the duodenum. Biopsied. Recommendation:        - Await pathology results.                        - Discharge patient to home (with escort).                        - Resume previous diet.                        - Continue present medications.                        - Return to my office in 3 weeks. Procedure Code(s):     --- Professional ---                        [phone], Esophagogastroduodenoscopy, flexible,                         transoral; with biopsy, single or multiple Diagnosis Code(s):     --- Professional ---                        K31.89, Other diseases of stomach and duodenum                        R93.3, Abnormal findings on diagnostic imaging of                         other parts of digestive tract CPT copyright 2019 American Medical Association. All rights reserved. The codes documented in this report are preliminary and upon coder review may  be revised to meet current compliance requirements. [name], MD [name] MD, MD 12/21/2018 10:39:49 AM This report has been signed electronically. Number of Addenda: 0 Note Initiated On: 12/21/2018 10:24 AM Estimated Blood Loss:  Estimated blood loss: none.      [facility]

## 2018-12-21 NOTE — Anesthesia Postprocedure Evaluation (Signed)
Anesthesia Post Note  Patient: Victor Cortez  Procedure(s) Performed: ESOPHAGOGASTRODUODENOSCOPY (EGD) WITH PROPOFOL (N/A )  Patient location during evaluation: Endoscopy Anesthesia Type: General Level of consciousness: awake and alert Pain management: pain level controlled Vital Signs Assessment: post-procedure vital signs reviewed and stable Respiratory status: spontaneous breathing, nonlabored ventilation, respiratory function stable and patient connected to nasal cannula oxygen Cardiovascular status: blood pressure returned to baseline and stable Postop Assessment: no apparent nausea or vomiting Anesthetic complications: no     Last Vitals:  Vitals:   12/21/18 1042 12/21/18 1049  BP: (!) 85/56 (!) 92/47  Pulse: 79   Resp: (!) 23   Temp: 36.4 C   SpO2: 97%     Last Pain:  Vitals:   12/21/18 1049  TempSrc:   PainSc: 0-No pain                 Precious Haws Vin [place]

## 2018-12-21 NOTE — Transfer of Care (Signed)
Immediate Anesthesia Transfer of Care Note  Patient: Victor Cortez  Procedure(s) Performed: ESOPHAGOGASTRODUODENOSCOPY (EGD) WITH PROPOFOL (N/A )  Patient Location: PACU  Anesthesia Type:General  Level of Consciousness: sedated  Airway & Oxygen Therapy: Patient Spontanous Breathing and Patient connected to nasal cannula oxygen  Post-op Assessment: Report given to RN and Post -op Vital signs reviewed and stable  Post vital signs: Reviewed and stable  Last Vitals:  Vitals Value Taken Time  BP 85/56 12/21/18 1042  Temp 36.4 C 12/21/18 1042  Pulse 79 12/21/18 1042  Resp 23 12/21/18 1042  SpO2 97 % 12/21/18 1042    Last Pain:  Vitals:   12/21/18 1042  TempSrc:   PainSc: 0-No pain         Complications: No apparent anesthesia complications

## 2018-12-21 NOTE — H&P (Signed)
Victor Bellows, MD [address] [place], [place], [place], [address] [address], [place], [place], [place], [address] Phone: [phone]  Fax: [phone]  Primary Care Physician:  Ria Bush, MD   Pre-Procedure History & Physical: HPI:  Victor Cortez is a 68 y.o. male is here for an endoscopy    Past Medical History:  Diagnosis Date  . Aortic atherosclerosis ([place])    by CT  . Arthritis   . Atrial fibrillation with RVR ([place]) 06/2013   during hospitalization  . Basal cell carcinoma of nose 1998  . CAD (coronary artery disease)    by CT  . Cataract   . Cholelithiasis 10/2017   by CT  . COPD (chronic obstructive pulmonary disease) ([place])   . Dysrhythmia   . ED (erectile dysfunction)   . Ex-smoker   . GERD (gastroesophageal reflux disease)   . Glaucoma    Dr. Matilde Sprang  . Glaucoma   . Hepatic steatosis    by CT  . History of chicken pox   . History of pneumonia 06/2013   with sepsis and [facility] hospitalization  . Hyperlipidemia   . Rosacea   . Seborrheic dermatitis     Past Surgical History:  Procedure Laterality Date  . CYSTOSCOPY WITH URETHRAL DILATATION  12/04/2017   Procedure: CYSTOSCOPY WITH URETHRAL DILATATION;  Surgeon: Billey Co, MD;  Location: [facility] ORS;  Service: Urology;;  . CYSTOSCOPY/URETEROSCOPY/HOLMIUM LASER/STENT PLACEMENT Left 12/04/2017   Procedure: CYSTOSCOPY/URETEROSCOPY/HOLMIUM LASER/STENT PLACEMENT;  Surgeon: Billey Co, MD;  Location: [facility] ORS;  Service: Urology;  Laterality: Left;  . ROTATOR CUFF REPAIR  2016    Prior to Admission medications   Medication Sig Start Date End Date Taking? Authorizing Provider  aspirin EC 81 MG tablet Take 81 mg by mouth daily. 1 tab by mouth in AM   Yes [provider]  AZOPT 1 % ophthalmic suspension  08/16/18  Yes [provider]  COMBIVENT RESPIMAT 20-100 MCG/ACT AERS respimat INHALE 1 PUFF INTO THE LUNGS EVERY 6 HOURS AS NEEDED FOR WHEEZING **NEED OFFICE VISIT** 12/06/18   Yes Ria Bush, MD  latanoprost (XALATAN) 0.005 % ophthalmic solution Place 1 drop into both eyes at bedtime. 05/15/13  Yes [provider]  losartan (COZAAR) 25 MG tablet  11/25/17  Yes [provider]  pravastatin (PRAVACHOL) 80 MG tablet Take 1 tablet (80 mg total) by mouth daily. 10/01/17  Yes Ria Bush, MD  sotalol (BETAPACE) 80 MG tablet Take 80 mg by mouth 2 (two) times daily. One tab by mouth twice a day 06/07/13  Yes [provider]  Cyanocobalamin (RA VITAMIN B-12 TR) 1000 MCG TBCR Take 1 tablet by mouth daily. One tab by mouth daily    [provider]  Glucosamine-Chondroit-Vit C-Mn (GLUCOSAMINE 1500 COMPLEX) CAPS Take 1 capsule by mouth daily.    [provider]  losartan (COZAAR) 50 MG tablet  07/19/18   [provider]  Multiple Vitamin (MULTI-VITAMINS) TABS Take by mouth. 1 tab by mouth daily    [provider]  Omega-3 Fatty Acids (FISH OIL) 1000 MG CAPS Take 1 capsule by mouth daily. 1 cap by mouth daily    [provider]  rivaroxaban (XARELTO) 20 MG TABS tablet Take 20 mg by mouth daily with supper.    [provider]  tadalafil (ADCIRCA/CIALIS) 20 MG tablet Take 1 tablet (20 mg total) by mouth daily as needed for erectile dysfunction. Patient not taking: Reported on 11/29/2018 01/14/18  Billey Co, MD    Allergies as of 12/16/2018  . (No Known Allergies)    Family History  Problem Relation Age of Onset  . COPD Father   . Cancer Father        lung (smoker)  . COPD Mother   . CAD Neg Hx   . Stroke Neg Hx   . Bladder Cancer Neg Hx   . Prostate cancer Neg Hx     Social History   Socioeconomic History  . Marital status: Married    Spouse name: Not on file  . Number of children: 1  . Years of education: Not on file  . Highest education level: Not on file  Occupational History  . Not on file  Social Needs  . Financial resource strain: Not on file  . Food  insecurity    Worry: Not on file    Inability: Not on file  . Transportation needs    Medical: Not on file    Non-medical: Not on file  Tobacco Use  . Smoking status: Former Smoker    Packs/day: 2.00    Years: 53.00    Pack years: 106.00    Types: Cigarettes    Quit date: 06/03/2013    Years since quitting: 5.5  . Smokeless tobacco: Never Used  Substance and Sexual Activity  . Alcohol use: No  . Drug use: No  . Sexual activity: Not on file  Lifestyle  . Physical activity    Days per week: Not on file    Minutes per session: Not on file  . Stress: Not on file  Relationships  . Social Herbalist on phone: Not on file    Gets together: Not on file    Attends religious service: Not on file    Active member of club or organization: Not on file    Attends meetings of clubs or organizations: Not on file    Relationship status: Not on file  . Intimate partner violence    Fear of current or ex partner: Not on file    Emotionally abused: Not on file    Physically abused: Not on file    Forced sexual activity: Not on file  Other Topics Concern  . 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 Neck:  Supple; no masses or thyromegaly. Lungs:  Clear throughout to auscultation, normal respiratory effort.    Heart:  +S1, +S2, Regular rate and rhythm, No edema. Abdomen:  Soft, nontender and nondistended. Normal bowel sounds, without guarding, and without rebound.   Neurologic:   Alert and  oriented x4;  grossly normal neurologically.  Impression/Plan: Victor Cortez is here for an endoscopy  to be performed for  evaluation of abnormal finding on MRI    Risks, benefits, limitations, and alternatives regarding endoscopy have been reviewed with the  patient.  Questions have been answered.  All parties agreeable.   Victor Bellows, MD  12/21/2018, 10:29 AM

## 2018-12-22 ENCOUNTER — Encounter: Payer: Self-pay | Admitting: Gastroenterology

## 2018-12-22 LAB — SURGICAL PATHOLOGY

## 2018-12-23 ENCOUNTER — Encounter: Payer: Self-pay | Admitting: Family Medicine

## 2018-12-26 ENCOUNTER — Encounter: Payer: Self-pay | Admitting: Gastroenterology

## 2019-01-06 ENCOUNTER — Other Ambulatory Visit: Payer: Self-pay

## 2019-01-06 ENCOUNTER — Other Ambulatory Visit: Payer: Self-pay | Admitting: Family Medicine

## 2019-01-06 ENCOUNTER — Ambulatory Visit (INDEPENDENT_AMBULATORY_CARE_PROVIDER_SITE_OTHER): Payer: Medicare Other | Admitting: Urology

## 2019-01-06 ENCOUNTER — Ambulatory Visit
Admission: RE | Admit: 2019-01-06 | Discharge: 2019-01-06 | Disposition: A | Discharge status: Home / Self Care | Payer: Medicare Other | Source: Ambulatory Visit | Attending: Urology | Admitting: Urology

## 2019-01-06 ENCOUNTER — Encounter: Payer: Self-pay | Admitting: Urology

## 2019-01-06 VITALS — BP 178/75 | HR 69 | Ht 66.0 in | Wt 160.0 lb

## 2019-01-06 DIAGNOSIS — N200 Calculus of kidney: Secondary | ICD-10-CM | POA: Insufficient documentation

## 2019-01-06 DIAGNOSIS — N529 Male erectile dysfunction, unspecified: Secondary | ICD-10-CM

## 2019-01-06 DIAGNOSIS — R310 Gross hematuria: Secondary | ICD-10-CM | POA: Diagnosis not present

## 2019-01-06 DIAGNOSIS — K8020 Calculus of gallbladder without cholecystitis without obstruction: Secondary | ICD-10-CM | POA: Diagnosis not present

## 2019-01-06 LAB — BLADDER SCAN AMB NON-IMAGING

## 2019-01-06 NOTE — Patient Instructions (Signed)
Dietary Guidelines to Help Prevent Kidney Stones Kidney stones are deposits of minerals and salts that form inside your kidneys. Your risk of developing kidney stones may be greater depending on your diet, your lifestyle, the medicines you take, and whether you have certain medical conditions. Most people can reduce their chances of developing kidney stones by following the instructions below. Depending on your overall health and the type of kidney stones you tend to develop, your dietitian may give you more specific instructions. What are tips for following this plan? Reading food labels  Choose foods with "no salt added" or "low-salt" labels. Limit your sodium intake to less than 1500 mg per day.  Choose foods with calcium for each meal and snack. Try to eat about 300 mg of calcium at each meal. Foods that contain 200-500 mg of calcium per serving include: ? 8 oz (237 ml) of milk, fortified nondairy milk, and fortified fruit juice. ? 8 oz (237 ml) of kefir, yogurt, and soy yogurt. ? 4 oz (118 ml) of tofu. ? 1 oz of cheese. ? 1 cup (300 g) of dried figs. ? 1 cup (91 g) of cooked broccoli. ? 1-3 oz can of sardines or mackerel.  Most people need 1000 to 1500 mg of calcium each day. Talk to your dietitian about how much calcium is recommended for you. Shopping  Buy plenty of fresh fruits and vegetables. Most people do not need to avoid fruits and vegetables, even if they contain nutrients that may contribute to kidney stones.  When shopping for convenience foods, choose: ? Whole pieces of fruit. ? Premade salads with dressing on the side. ? Low-fat fruit and yogurt smoothies.  Avoid buying frozen meals or prepared deli foods.  Look for foods with live cultures, such as yogurt and kefir. Cooking  Do not add salt to food when cooking. Place a salt shaker on the table and allow each person to add his or her own salt to taste.  Use vegetable protein, such as beans, textured vegetable  protein (TVP), or tofu instead of meat in pasta, casseroles, and soups. Meal planning   Eat less salt, if told by your dietitian. To do this: ? Avoid eating processed or premade food. ? Avoid eating fast food.  Eat less animal protein, including cheese, meat, poultry, or fish, if told by your dietitian. To do this: ? Limit the number of times you have meat, poultry, fish, or cheese each week. Eat a diet free of meat at least 2 days a week. ? Eat only one serving each day of meat, poultry, fish, or seafood. ? When you prepare animal protein, cut pieces into small portion sizes. For most meat and fish, one serving is about the size of one deck of cards.  Eat at least 5 servings of fresh fruits and vegetables each day. To do this: ? Keep fruits and vegetables on hand for snacks. ? Eat 1 piece of fruit or a handful of berries with breakfast. ? Have a salad and fruit at lunch. ? Have two kinds of vegetables at dinner.  Limit foods that are high in a substance called oxalate. These include: ? Spinach. ? Rhubarb. ? Beets. ? Potato chips and french fries. ? Nuts.  If you regularly take a diuretic medicine, make sure to eat at least 1-2 fruits or vegetables high in potassium each day. These include: ? Avocado. ? Banana. ? Orange, prune, carrot, or tomato juice. ? Baked potato. ? Cabbage. ? Beans and split   peas. General instructions   Drink enough fluid to keep your urine clear or pale yellow. This is the most important thing you can do.  Talk to your health care provider and dietitian about taking daily supplements. Depending on your health and the cause of your kidney stones, you may be advised: ? Not to take supplements with vitamin C. ? To take a calcium supplement. ? To take a daily probiotic supplement. ? To take other supplements such as magnesium, fish oil, or vitamin B6.  Take all medicines and supplements as told by your health care provider.  Limit alcohol intake to no  more than 1 drink a day for nonpregnant women and 2 drinks a day for men. One drink equals 12 oz of beer, 5 oz of wine, or 1 oz of hard liquor.  Lose weight if told by your health care provider. Work with your dietitian to find strategies and an eating plan that works best for you. What foods are not recommended? Limit your intake of the following foods, or as told by your dietitian. Talk to your dietitian about specific foods you should avoid based on the type of kidney stones and your overall health. Grains Breads. Bagels. Rolls. Baked goods. Salted crackers. Cereal. Pasta. Vegetables Spinach. Rhubarb. Beets. Canned vegetables. Victor Cortez. Olives. Meats and other protein foods Nuts. Nut butters. Large portions of meat, poultry, or fish. Salted or cured meats. Deli meats. Hot dogs. Sausages. Dairy Cheese. Beverages Regular soft drinks. Regular vegetable juice. Seasonings and other foods Seasoning blends with salt. Salad dressings. Canned soups. Soy sauce. Ketchup. Barbecue sauce. Canned pasta sauce. Casseroles. Pizza. Lasagna. Frozen meals. Potato chips. [place] fries. Summary  You can reduce your risk of kidney stones by making changes to your diet.  The most important thing you can do is drink enough fluid. You should drink enough fluid to keep your urine clear or pale yellow.  Ask your health care provider or dietitian how much protein from animal sources you should eat each day, and also how much salt and calcium you should have each day. This information is not intended to replace advice given to you by your health care provider. Make sure you discuss any questions you have with your health care provider. Document Released: 05/17/2010 Document Revised: 05/12/2018 Document Reviewed: 01/01/2016 Elsevier Patient Education  2020 [employer].

## 2019-01-06 NOTE — Progress Notes (Signed)
   01/06/2019 2:56 PM   Victor Cortez 01-28-1951 [id number]  Reason for visit: Follow up nephrolithiasis, urethral stricture, ED  HPI: I saw Victor Cortez back for the above issues.  I originally met him in November 2019 when he underwent a left ureteroscopy for a left distal ureteral stone.  He had a 6 French fossa navicularis stricture that required serial dilation at that time.  He reports he has been urinating well since that time and denies any urinary complaints today.  He denies any flank pain, gross hematuria, or dysuria.  Regarding his erectile dysfunction, he feels that the 20 mg Cialis is not working very well for him anymore.  He is not bothered by this and is not interested in further treatment for erectile dysfunction.  Overall, he is doing very well from a urologic perspective and has no complaints today.  PVR today is 55 mL.  I personally reviewed the KUB today, stable 1 cm right lower pole stone.  We discussed general stone prevention strategies including adequate hydration with goal of producing 2.5 L of urine daily, increasing citric acid intake, increasing calcium intake during high oxalate meals, minimizing animal protein, and decreasing salt intake. Information about dietary recommendations given today.  We also discussed return precautions including gross hematuria, flank pain, difficulty urinating, or dysuria.  RTC 1 year with KUB prior  A total of 25 minutes were spent face-to-face with the patient, greater than 50% was spent in patient education, counseling, and coordination of care regarding nephrolithiasis, history of urethral stricture, and ED.  Billey Co, [facility] [address], [place], [place] [address] [phone]

## 2019-02-08 ENCOUNTER — Other Ambulatory Visit: Payer: Self-pay | Admitting: Family Medicine

## 2019-02-10 ENCOUNTER — Other Ambulatory Visit: Payer: Self-pay

## 2019-02-10 MED ORDER — COMBIVENT RESPIMAT 20-100 MCG/ACT IN AERS: INHALATION_SPRAY | RESPIRATORY_TRACT | 0 refills | Status: DC

## 2019-02-10 NOTE — Telephone Encounter (Signed)
Refilled inhaler until appointment on 02/28/2019

## 2019-02-12 IMAGING — CT CT CHEST LUNG CANCER SCREENING LOW DOSE W/O CM
2 of 5 series · 15 of 40 positions shown, 18 images · non-contrast
Comparison: Low-dose lung cancer screening chest CT 09/24/2016.

CLINICAL DATA: 67-year-old male former smoker (quit 4 years ago)
with 106 pack-year history of smoking. Lung cancer screening
examination.

EXAM:
CT CHEST WITHOUT CONTRAST LOW-DOSE FOR LUNG CANCER SCREENING
TECHNIQUE: Multidetector CT imaging of the chest was performed following the
standard protocol without IV contrast.

[Series 3: lung · axial · 0.69mm/px · z∈[-1409,-1079]mm · 12 of 366 slices shown, 15 images (1 of 2)]
[im 18/366  mediastinal]
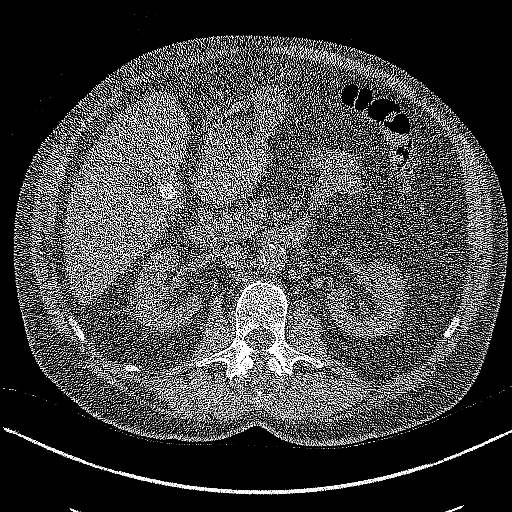
[im 18/366  lung]
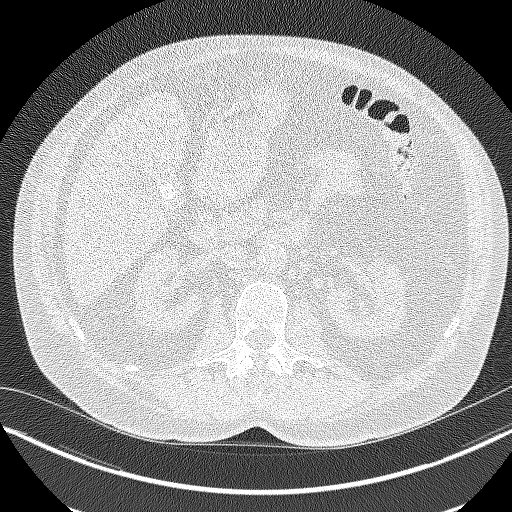
[im 53/366  lung]
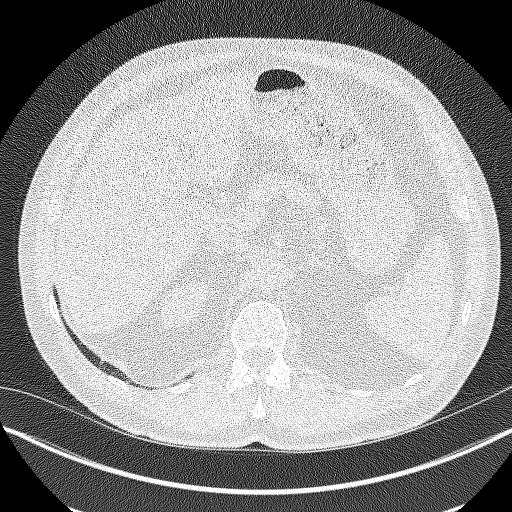
[im 87/366  lung]
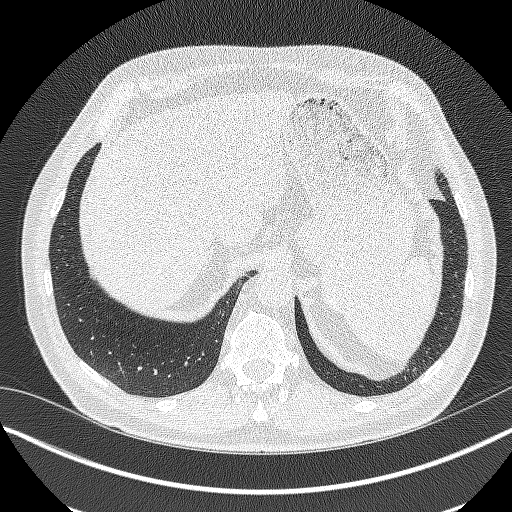
[im 105/366  lung]
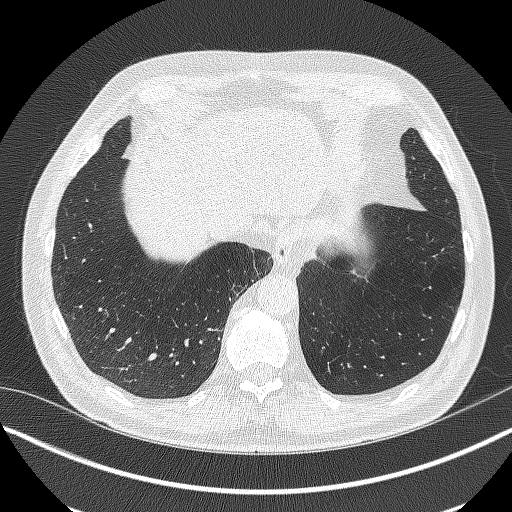
[im 140/366  mediastinal]
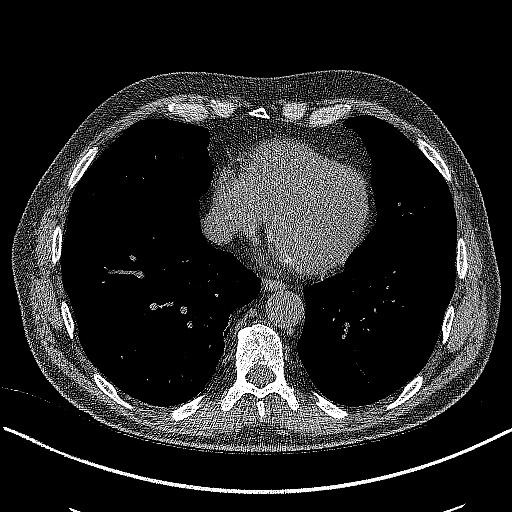
[im 140/366  lung]
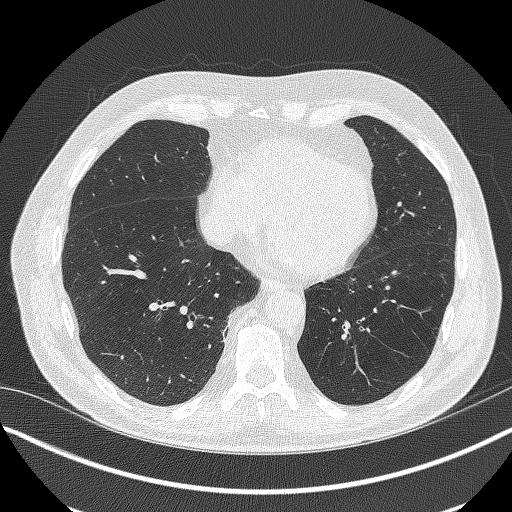
[im 174/366  lung]
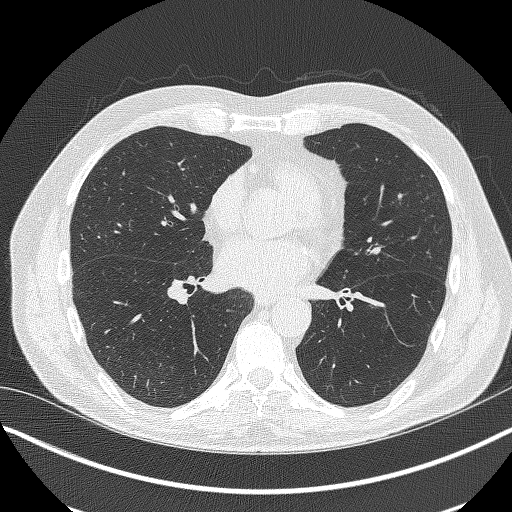
[im 192/366  lung]
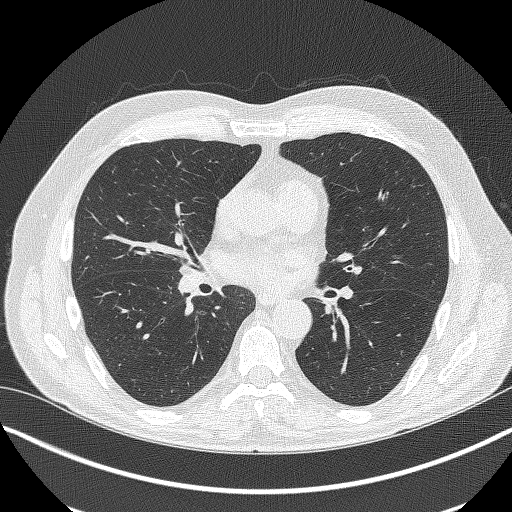
[im 226/366  lung]
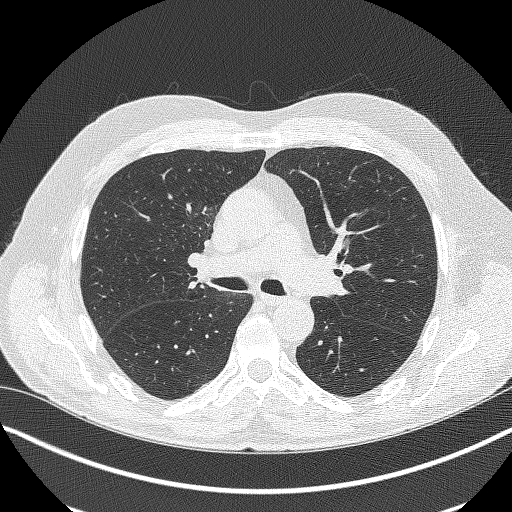
[im 261/366  mediastinal]
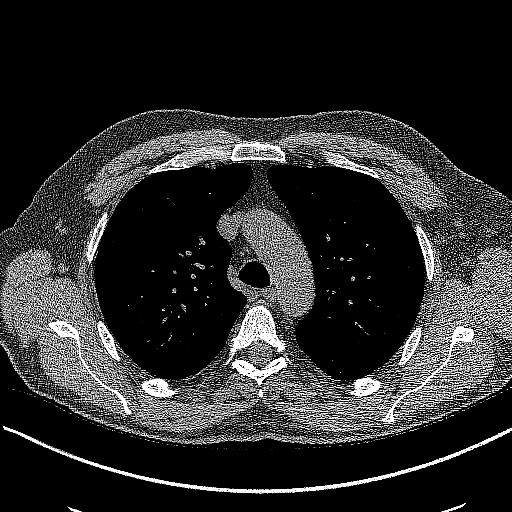
[im 261/366  lung]
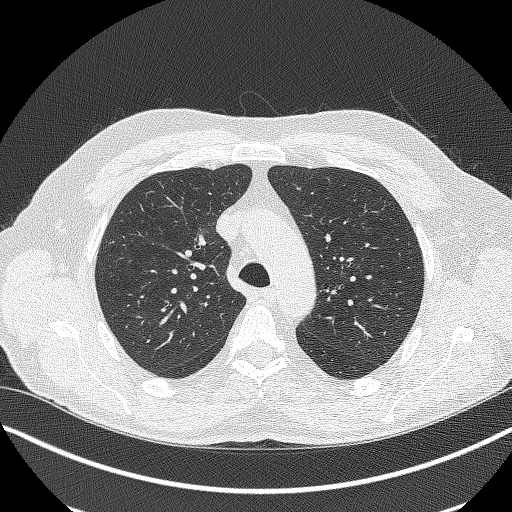
[im 279/366  lung]
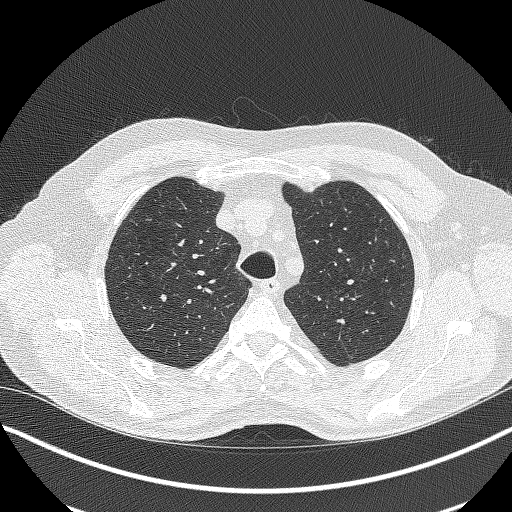
[im 313/366  lung]
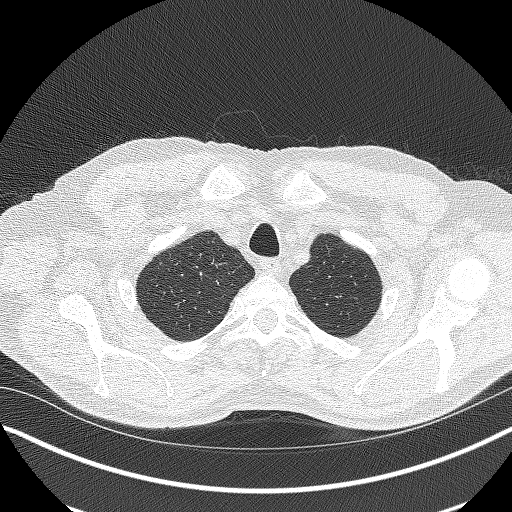
[im 348/366  lung]
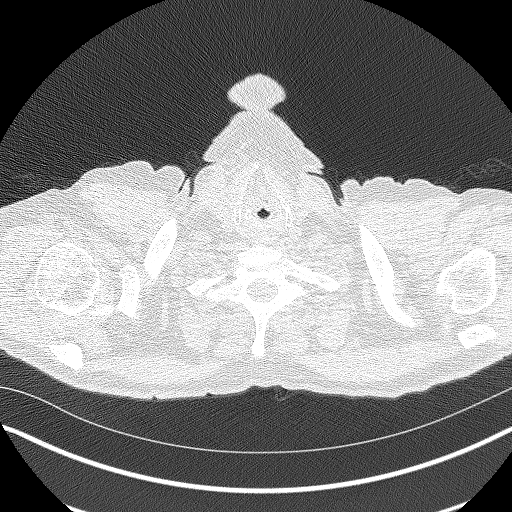

[Series 4: lung · coronal · 0.69mm/px · 3 of 295 slices shown (2 of 2)]
[im 59/295  lung]
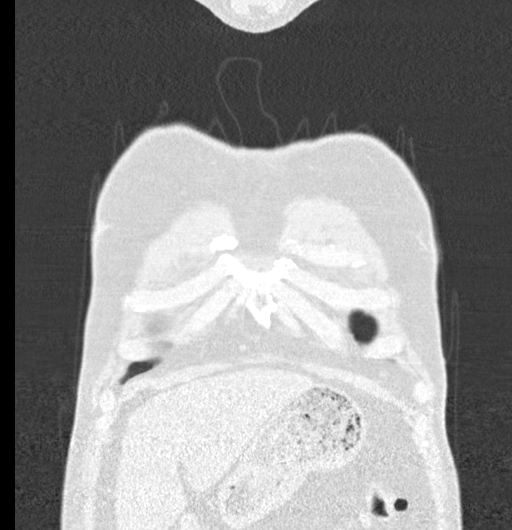
[im 118/295  lung]
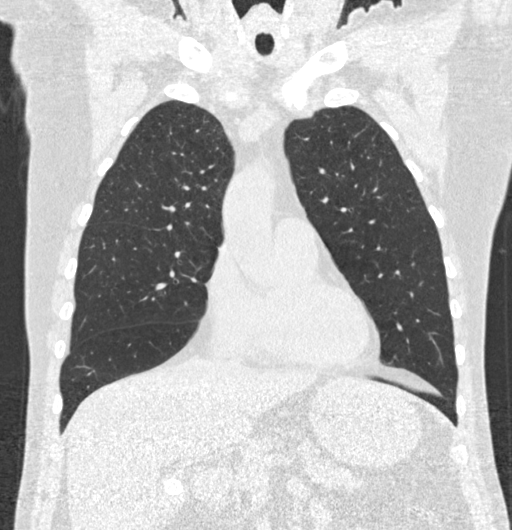
[im 177/295  lung]
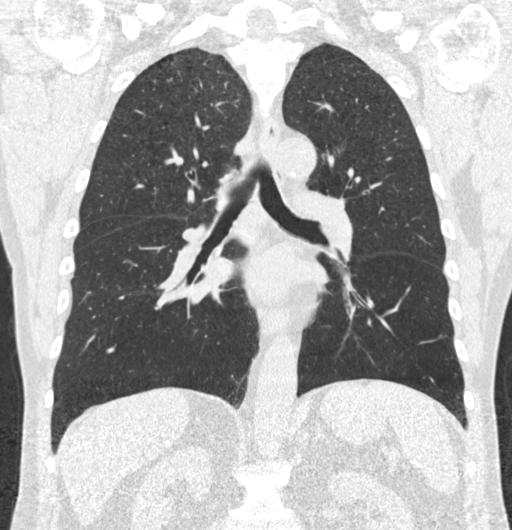

[15 of 40 positions shown; findings below may reference images not displayed]

FINDINGS: Cardiovascular: Heart size is normal. There is no significant
pericardial fluid, thickening or pericardial calcification. There is
aortic atherosclerosis, as well as atherosclerosis of the great
vessels of the mediastinum and the coronary arteries, including
calcified atherosclerotic plaque in the left anterior descending,
left circumflex and right coronary arteries.

Mediastinum/Nodes: No pathologically enlarged mediastinal or hilar
lymph nodes. Please note that accurate exclusion of hilar adenopathy
is limited on noncontrast CT scans. Esophagus is unremarkable in
appearance. No axillary lymphadenopathy.

Lungs/Pleura: Multiple tiny pulmonary nodules are noted throughout
both lungs, with the largest of these in the right upper lobe (axial
image 80 of series 3) demonstrating a volume derived mean diameter
of 3.9 mm. No larger more suspicious appearing pulmonary nodules or
masses are noted. No acute consolidative airspace disease. No
pleural effusions. Mild diffuse bronchial wall thickening with mild
centrilobular and paraseptal emphysema.

Upper Abdomen: Calcified gallstones in the gallbladder. Aortic
atherosclerosis.

Musculoskeletal: There are no aggressive appearing lytic or blastic
lesions noted in the visualized portions of the skeleton.
IMPRESSION: 1. Lung-RADS 2S, benign appearance or behavior. Continue annual
screening with low-dose chest CT without contrast in 12 months.
2. The "S" modifier above refers to potentially clinically
significant non lung cancer related findings. Specifically, there is
aortic atherosclerosis, in addition to three-vessel coronary artery
disease. Please note that although the presence of coronary artery
calcium documents the presence of coronary artery disease, the
severity of this disease and any potential stenosis cannot be
assessed on this non-gated CT examination. Assessment for potential
risk factor modification, dietary therapy or pharmacologic therapy
may be warranted, if clinically indicated.
3. Mild diffuse bronchial wall thickening with mild centrilobular
and paraseptal emphysema; imaging findings suggestive of underlying
COPD.
4. Cholelithiasis.

Aortic Atherosclerosis (WIBIH-5FC.C) and Emphysema (WIBIH-ANN.9).

## 2019-02-24 DIAGNOSIS — I10 Essential (primary) hypertension: Secondary | ICD-10-CM | POA: Diagnosis not present

## 2019-02-24 DIAGNOSIS — I4891 Unspecified atrial fibrillation: Secondary | ICD-10-CM | POA: Diagnosis not present

## 2019-02-24 DIAGNOSIS — I2510 Atherosclerotic heart disease of native coronary artery without angina pectoris: Secondary | ICD-10-CM | POA: Diagnosis not present

## 2019-02-24 DIAGNOSIS — E782 Mixed hyperlipidemia: Secondary | ICD-10-CM | POA: Diagnosis not present

## 2019-02-24 DIAGNOSIS — R0602 Shortness of breath: Secondary | ICD-10-CM | POA: Diagnosis not present

## 2019-02-28 ENCOUNTER — Ambulatory Visit (INDEPENDENT_AMBULATORY_CARE_PROVIDER_SITE_OTHER): Payer: Medicare Other | Admitting: Family Medicine

## 2019-02-28 ENCOUNTER — Encounter: Payer: Self-pay | Admitting: Family Medicine

## 2019-02-28 ENCOUNTER — Other Ambulatory Visit: Payer: Self-pay

## 2019-02-28 VITALS — BP 140/82 | HR 66 | Temp 97.7°F | Ht 66.0 in | Wt 164.2 lb

## 2019-02-28 DIAGNOSIS — J449 Chronic obstructive pulmonary disease, unspecified: Secondary | ICD-10-CM

## 2019-02-28 DIAGNOSIS — I2510 Atherosclerotic heart disease of native coronary artery without angina pectoris: Secondary | ICD-10-CM | POA: Diagnosis not present

## 2019-02-28 DIAGNOSIS — M79641 Pain in right hand: Secondary | ICD-10-CM

## 2019-02-28 DIAGNOSIS — K8020 Calculus of gallbladder without cholecystitis without obstruction: Secondary | ICD-10-CM | POA: Diagnosis not present

## 2019-02-28 DIAGNOSIS — D181 Lymphangioma, any site: Secondary | ICD-10-CM | POA: Diagnosis not present

## 2019-02-28 DIAGNOSIS — I708 Atherosclerosis of other arteries: Secondary | ICD-10-CM | POA: Diagnosis not present

## 2019-02-28 DIAGNOSIS — N201 Calculus of ureter: Secondary | ICD-10-CM | POA: Diagnosis not present

## 2019-02-28 DIAGNOSIS — I700 Atherosclerosis of aorta: Secondary | ICD-10-CM | POA: Diagnosis not present

## 2019-02-28 DIAGNOSIS — I4891 Unspecified atrial fibrillation: Secondary | ICD-10-CM

## 2019-02-28 DIAGNOSIS — K2980 Duodenitis without bleeding: Secondary | ICD-10-CM | POA: Diagnosis not present

## 2019-02-28 DIAGNOSIS — K219 Gastro-esophageal reflux disease without esophagitis: Secondary | ICD-10-CM

## 2019-02-28 DIAGNOSIS — K760 Fatty (change of) liver, not elsewhere classified: Secondary | ICD-10-CM

## 2019-02-28 MED ORDER — CALCIUM CARBONATE ANTACID 500 MG PO CHEW: 1.0000 | CHEWABLE_TABLET | Freq: Every day | ORAL | Status: AC | PRN

## 2019-02-28 MED ORDER — OMEPRAZOLE 40 MG PO CPDR: 40.0000 mg | DELAYED_RELEASE_CAPSULE | Freq: Every day | ORAL | 3 refills | Status: DC

## 2019-02-28 NOTE — Progress Notes (Signed)
This visit was conducted in person.  BP 140/82 (BP Location: Left Arm, Patient Position: Sitting, Cuff Size: Normal)   Pulse 66   Temp 97.7 F (36.5 C) (Temporal)   Ht 5\' 6"  (1.676 m)   Wt 164 lb 3 oz (74.5 kg)   SpO2 95%   BMI 26.50 kg/m    CC: f/u visit Subjective:    Patient ID: Victor Cortez, male    DOB: 05-Apr-1950, 69 y.o.   MRN: [id number]  HPI: Victor Cortez is a 69 y.o. male presenting on 02/28/2019 for Follow-up   Last seen 11/2017. Recent records reviewed.   Sees urology for nephrolithiasis.  Sees cardiology Dr Humphrey Rolls, last seen on Thursday.  Saw GI for small lymphangioma seen in proximal stomach on recent MRI - stable EGD.   GERD - intermittent, manages with tums (up to 8 a day).   Quit smoking 5 yrs ago.  Glaucoma with cataracts sees eye doctor regularly.  afib - sees cardiology.  COPD with emphysema on combivent PRN and receives benefit from this medication.    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 Interim medical history since our last visit reviewed. Allergies and medications reviewed and updated. Outpatient Medications Prior to Visit  Medication Sig Dispense Refill  . aspirin EC 81 MG tablet Take 81 mg by mouth daily. 1 tab by mouth in AM    . AZOPT 1 % ophthalmic suspension     . Cyanocobalamin (RA VITAMIN B-12 TR) 1000 MCG TBCR Take 1 tablet by mouth daily. One tab by mouth daily    . Glucosamine-Chondroit-Vit C-Mn (GLUCOSAMINE 1500 COMPLEX) CAPS Take 1 capsule by mouth daily.    Marland Kitchen latanoprost (XALATAN) 0.005 % ophthalmic solution Place 1 drop into both eyes at bedtime.    Marland Kitchen losartan (COZAAR) 100 MG tablet Take 100 mg by mouth daily.    . Multiple Vitamin (MULTI-VITAMINS) TABS Take by mouth. 1 tab by mouth daily    . Omega-3 Fatty Acids (FISH OIL) 1000 MG CAPS Take 1 capsule by mouth daily. 1 cap by mouth daily    . pravastatin  (PRAVACHOL) 80 MG tablet Take 1 tablet (80 mg total) by mouth daily.    . rivaroxaban (XARELTO) 20 MG TABS tablet Take 20 mg by mouth daily with supper.    . rosuvastatin (CRESTOR) 40 MG tablet Take 40 mg by mouth daily.    . sotalol (BETAPACE) 80 MG tablet Take 80 mg by mouth 2 (two) times daily. One tab by mouth twice a day    . tadalafil (ADCIRCA/CIALIS) 20 MG tablet Take 1 tablet (20 mg total) by mouth daily as needed for erectile dysfunction. 10 tablet 11  . Ipratropium-Albuterol (COMBIVENT RESPIMAT) 20-100 MCG/ACT AERS respimat INHALE 1 PUFF INTO THE LUNGS EVERY 6 HOURS AS NEEDED 4 g 0  . losartan (COZAAR) 25 MG tablet     . losartan (COZAAR) 50 MG tablet      No facility-administered medications prior to visit.     Per HPI unless specifically indicated in ROS section below Review of Systems Objective:    BP 140/82 (BP Location: Left Arm, Patient Position: Sitting, Cuff Size: Normal)   Pulse 66   Temp 97.7 F (36.5 C) (Temporal)   Ht 5\' 6"  (1.676 m)   Wt 164 lb 3 oz (74.5 kg)   SpO2 95%  BMI 26.50 kg/m   Wt Readings from Last 3 Encounters:  02/28/19 164 lb 3 oz (74.5 kg)  01/06/19 160 lb (72.6 kg)  12/21/18 160 lb (72.6 kg)    Physical Exam Vitals and nursing note reviewed.  Constitutional:      Appearance: Normal appearance. He is not ill-appearing.  Eyes:     Extraocular Movements: Extraocular movements intact.     Pupils: Pupils are equal, round, and reactive to light.     Comments: Cataracts present  Cardiovascular:     Rate and Rhythm: Normal rate and regular rhythm.     Pulses: Normal pulses.     Heart sounds: Normal heart sounds. No murmur.  Pulmonary:     Effort: Pulmonary effort is normal. No respiratory distress.     Breath sounds: Rhonchi (coarse rhonchi bilaterally) present. No wheezing or rales.  Musculoskeletal:     Right lower leg: No edema.     Left lower leg: No edema.  Skin:    General: Skin is warm and dry.     Findings: No erythema or rash.   Neurological:     Mental Status: He is alert.  Psychiatric:        Mood and Affect: Mood normal.        Behavior: Behavior normal.       Lab Results  Component Value Date   CREATININE 1.50 (H) 10/17/2018   BUN 16 11/16/2017   NA 142 11/16/2017   K 4.4 11/16/2017   CL 103 11/16/2017   CO2 33 (H) 11/16/2017    Assessment & Plan:  This visit occurred during the SARS-CoV-2 public health emergency.  Safety protocols were in place, including screening questions prior to the visit, additional usage of staff PPE, and extensive cleaning of exam room while observing appropriate contact time as indicated for disinfecting solutions.  Encouraged he return for CPE in the summer.  Problem List Items Addressed This Visit    Thoracic aortic atherosclerosis ([place])    Thoracic and abdominal on recent MRI abdomen. Continue aspirin, statin. Encouraged limiting supplemental calcium intake (tums)      Relevant Medications   losartan (COZAAR) 100 MG tablet   Right hand pain    Ache remains at 3rd MCP joint - anticipate OA related. Suggested OTC voltaren gel      Lymphangioma    Again noted on recent PET and MRI. Overall stable since 2017. Had reassuring EGD 02/2019.       Lone atrial fibrillation with RVR    Continues xarelto through cardiology Humphrey Rolls). No recent office notes available - I have requested today.       Relevant Medications   losartan (COZAAR) 100 MG tablet   Left ureteral stone    S/p surgery 2019, now followed by urology      Hepatic steatosis    By MRI 2020      GERD (gastroesophageal reflux disease)    Persistent symptoms managed with multiple tums a day. Will trial omeprazole 40mg  daily x 3 wks then PRN.       Relevant Medications   calcium carbonate (TUMS) 500 MG chewable tablet   omeprazole (PRILOSEC) 40 MG capsule   Duodenitis determined by biopsy - Primary    Discussed with patient. He avoids NSAIDs and doesn't drink alcohol. Endorses significant GERD symptoms -  will start PPI.       COPD (chronic obstructive pulmonary disease) ([place])    combivent refilled per pt request.  Relevant Medications   Ipratropium-Albuterol (COMBIVENT RESPIMAT) 20-100 MCG/ACT AERS respimat   Cholelithiasis    Saw gen surg - no need for intervention at this time.       CAD (coronary artery disease)    Asxs. Encouraged limiting calcium supplements.       Relevant Medications   losartan (COZAAR) 100 MG tablet   Aorto-iliac atherosclerosis ([facility])    Continue aspirin, statin.       Relevant Medications   losartan (COZAAR) 100 MG tablet       Meds ordered this encounter  Medications  . calcium carbonate (TUMS) 500 MG chewable tablet    Sig: Chew 1 tablet (200 mg of elemental calcium total) by mouth daily as needed for indigestion or heartburn.  Marland Kitchen omeprazole (PRILOSEC) 40 MG capsule    Sig: Take 1 capsule (40 mg total) by mouth daily. For 3 weeks then as needed    Dispense:  30 capsule    Refill:  3  . Ipratropium-Albuterol (COMBIVENT RESPIMAT) 20-100 MCG/ACT AERS respimat    Sig: Inhale 1 puff into the lungs every 6 (six) hours as needed for wheezing or shortness of breath.    Dispense:  4 g    Refill:  6   No orders of the defined types were placed in this encounter.   Patient Instructions  Sign release for records from cardiology (Dr Humphrey Rolls) with [facility].  Start omeprazole 40mg  daily for 3 weeks daily then as needed.  Try voltaren gel to R middle finger joint Good to see you today, return in the summer for wellness visit.    Follow up plan: Return in about 6 months (around 08/28/2019), or if symptoms worsen or fail to improve, for follow up visit.  Ria Bush, MD

## 2019-02-28 NOTE — Patient Instructions (Addendum)
Sign release for records from cardiology (Dr Humphrey Rolls) with [facility].  Start omeprazole 40mg  daily for 3 weeks daily then as needed.  Try voltaren gel to R middle finger joint Good to see you today, return in the summer for wellness visit.

## 2019-03-01 DIAGNOSIS — K2980 Duodenitis without bleeding: Secondary | ICD-10-CM | POA: Insufficient documentation

## 2019-03-01 DIAGNOSIS — K8020 Calculus of gallbladder without cholecystitis without obstruction: Secondary | ICD-10-CM | POA: Insufficient documentation

## 2019-03-01 DIAGNOSIS — M79641 Pain in right hand: Secondary | ICD-10-CM | POA: Insufficient documentation

## 2019-03-01 DIAGNOSIS — K760 Fatty (change of) liver, not elsewhere classified: Secondary | ICD-10-CM | POA: Insufficient documentation

## 2019-03-01 DIAGNOSIS — I700 Atherosclerosis of aorta: Secondary | ICD-10-CM | POA: Insufficient documentation

## 2019-03-01 DIAGNOSIS — D181 Lymphangioma, any site: Secondary | ICD-10-CM | POA: Insufficient documentation

## 2019-03-01 MED ORDER — COMBIVENT RESPIMAT 20-100 MCG/ACT IN AERS: 1.0000 | INHALATION_SPRAY | Freq: Four times a day (QID) | RESPIRATORY_TRACT | 6 refills | Status: DC | PRN

## 2019-03-01 NOTE — Assessment & Plan Note (Signed)
combivent refilled per pt request.

## 2019-03-01 NOTE — Assessment & Plan Note (Signed)
By MRI 2020

## 2019-03-01 NOTE — Assessment & Plan Note (Signed)
Ache remains at 3rd MCP joint - anticipate OA related. Suggested OTC voltaren gel

## 2019-03-01 NOTE — Assessment & Plan Note (Signed)
Asxs. Encouraged limiting calcium supplements.

## 2019-03-01 NOTE — Assessment & Plan Note (Signed)
Persistent symptoms managed with multiple tums a day. Will trial omeprazole 40mg  daily x 3 wks then PRN.

## 2019-03-01 NOTE — Assessment & Plan Note (Signed)
Again noted on recent PET and MRI. Overall stable since 2017. Had reassuring EGD 02/2019.

## 2019-03-01 NOTE — Assessment & Plan Note (Addendum)
Continues xarelto through cardiology Humphrey Rolls). No recent office notes available - I have requested today.

## 2019-03-01 NOTE — Assessment & Plan Note (Addendum)
S/p surgery 2019, now followed by urology

## 2019-03-01 NOTE — Assessment & Plan Note (Signed)
Continue aspirin, statin.  

## 2019-03-01 NOTE — Assessment & Plan Note (Signed)
Discussed with patient. He avoids NSAIDs and doesn't drink alcohol. Endorses significant GERD symptoms - will start PPI.

## 2019-03-01 NOTE — Assessment & Plan Note (Signed)
Thoracic and abdominal on recent MRI abdomen. Continue aspirin, statin. Encouraged limiting supplemental calcium intake (tums)

## 2019-03-01 NOTE — Assessment & Plan Note (Signed)
Saw gen surg - no need for intervention at this time.

## 2019-03-07 ENCOUNTER — Emergency Department: Payer: Medicare Other

## 2019-03-07 ENCOUNTER — Other Ambulatory Visit: Payer: Self-pay

## 2019-03-07 ENCOUNTER — Inpatient Hospital Stay
Admission: EM | Admit: 2019-03-07 | Discharge: 2019-03-10 | DRG: 200 | Disposition: A | Discharge status: Home / Self Care | Payer: Medicare Other | Attending: Student | Admitting: Student

## 2019-03-07 DIAGNOSIS — Z85828 Personal history of other malignant neoplasm of skin: Secondary | ICD-10-CM | POA: Diagnosis not present

## 2019-03-07 DIAGNOSIS — E785 Hyperlipidemia, unspecified: Secondary | ICD-10-CM

## 2019-03-07 DIAGNOSIS — N179 Acute kidney failure, unspecified: Secondary | ICD-10-CM | POA: Diagnosis present

## 2019-03-07 DIAGNOSIS — Z801 Family history of malignant neoplasm of trachea, bronchus and lung: Secondary | ICD-10-CM | POA: Diagnosis not present

## 2019-03-07 DIAGNOSIS — E538 Deficiency of other specified B group vitamins: Secondary | ICD-10-CM | POA: Diagnosis present

## 2019-03-07 DIAGNOSIS — Z20822 Contact with and (suspected) exposure to covid-19: Secondary | ICD-10-CM | POA: Diagnosis present

## 2019-03-07 DIAGNOSIS — K219 Gastro-esophageal reflux disease without esophagitis: Secondary | ICD-10-CM | POA: Diagnosis present

## 2019-03-07 DIAGNOSIS — Z87891 Personal history of nicotine dependence: Secondary | ICD-10-CM

## 2019-03-07 DIAGNOSIS — I2510 Atherosclerotic heart disease of native coronary artery without angina pectoris: Secondary | ICD-10-CM | POA: Diagnosis present

## 2019-03-07 DIAGNOSIS — H409 Unspecified glaucoma: Secondary | ICD-10-CM | POA: Diagnosis present

## 2019-03-07 DIAGNOSIS — M1990 Unspecified osteoarthritis, unspecified site: Secondary | ICD-10-CM | POA: Diagnosis present

## 2019-03-07 DIAGNOSIS — J939 Pneumothorax, unspecified: Secondary | ICD-10-CM | POA: Diagnosis not present

## 2019-03-07 DIAGNOSIS — I700 Atherosclerosis of aorta: Secondary | ICD-10-CM | POA: Diagnosis present

## 2019-03-07 DIAGNOSIS — J449 Chronic obstructive pulmonary disease, unspecified: Secondary | ICD-10-CM | POA: Diagnosis present

## 2019-03-07 DIAGNOSIS — J9383 Other pneumothorax: Secondary | ICD-10-CM | POA: Diagnosis present

## 2019-03-07 DIAGNOSIS — Z7901 Long term (current) use of anticoagulants: Secondary | ICD-10-CM

## 2019-03-07 DIAGNOSIS — Z7982 Long term (current) use of aspirin: Secondary | ICD-10-CM | POA: Diagnosis not present

## 2019-03-07 DIAGNOSIS — R0602 Shortness of breath: Secondary | ICD-10-CM | POA: Diagnosis not present

## 2019-03-07 DIAGNOSIS — I482 Chronic atrial fibrillation, unspecified: Secondary | ICD-10-CM

## 2019-03-07 DIAGNOSIS — I480 Paroxysmal atrial fibrillation: Secondary | ICD-10-CM | POA: Diagnosis present

## 2019-03-07 DIAGNOSIS — R062 Wheezing: Secondary | ICD-10-CM | POA: Diagnosis not present

## 2019-03-07 DIAGNOSIS — Z825 Family history of asthma and other chronic lower respiratory diseases: Secondary | ICD-10-CM

## 2019-03-07 DIAGNOSIS — Z9689 Presence of other specified functional implants: Secondary | ICD-10-CM

## 2019-03-07 DIAGNOSIS — Z4682 Encounter for fitting and adjustment of non-vascular catheter: Secondary | ICD-10-CM | POA: Diagnosis not present

## 2019-03-07 LAB — CBC
HCT: 40.6 % (ref 39.0–52.0)
Hemoglobin: 13.2 g/dL (ref 13.0–17.0)
MCH: 28.5 pg (ref 26.0–34.0)
MCHC: 32.5 g/dL (ref 30.0–36.0)
MCV: 87.7 fL (ref 80.0–100.0)
Platelets: 287 10*3/uL (ref 150–400)
RBC: 4.63 MIL/uL (ref 4.22–5.81)
RDW: 13.7 % (ref 11.5–15.5)
WBC: 9.9 10*3/uL (ref 4.0–10.5)
nRBC: 0 % (ref 0.0–0.2)

## 2019-03-07 LAB — TROPONIN I (HIGH SENSITIVITY)
Troponin I (High Sensitivity): 6 ng/L (ref <18)
Troponin I (High Sensitivity): 5 ng/L (ref <18)

## 2019-03-07 LAB — BASIC METABOLIC PANEL
Anion gap: 9 (ref 5–15)
BUN: 19 mg/dL (ref 8–23)
CO2: 23 mmol/L (ref 22–32)
Calcium: 9.1 mg/dL (ref 8.9–10.3)
Chloride: 108 mmol/L (ref 98–111)
Creatinine, Ser: 1.33 mg/dL — ABNORMAL HIGH (ref 0.61–1.24)
GFR calc Af Amer: >60 mL/min (ref >60)
GFR calc non Af Amer: 54 mL/min — ABNORMAL LOW (ref >60)
Glucose, Bld: 105 mg/dL — ABNORMAL HIGH (ref 70–99)
Potassium: 4.3 mmol/L (ref 3.5–5.1)
Sodium: 140 mmol/L (ref 135–145)

## 2019-03-07 MED ORDER — LIDOCAINE HCL (PF) 1 % IJ SOLN
5.0000 mL | Freq: Once | INTRAMUSCULAR | Status: AC
  Administered 2019-03-07: 5 mL
  Filled 2019-03-07: qty 5

## 2019-03-07 NOTE — ED Notes (Signed)
Chest tube taken out by md.   md re inserted chest tube.  Pt tolerated well.  nsr on monitor.  Pt alert.

## 2019-03-07 NOTE — ED Triage Notes (Signed)
Pt arrives to ED via POV from home with c/o [facility] and CP since 330am this morning. Pt reports h/x COPD and A-fib. Pt reports generalized CP without radiation into the neck, jaw, back, or arms. No N/V/D, no fever. Pt is A&O, in NAD; RR even, regular, and unlabored.

## 2019-03-07 NOTE — Progress Notes (Signed)
Contacted by ED physician, Dr. Archie Balboa, regarding this 69 y/o male presenting with SOB.  CXR demonstrated a modest-sized PTX, without mediastinal shift.  Dr. Archie Balboa kindly agreed to place a chest tube.  Due to multiple medical comorbidities, including a-fib with RVR, COPD, renal insufficiency, CAD, atherosclerotic vascular disease, among others, I have recommended hospitalist admission, with surgery to follow in consultation.  Full consult note to follow; Dr. Genevive Bi will likely take over management of chest tube.

## 2019-03-07 NOTE — ED Provider Notes (Signed)
[facility] Emergency Department Provider Note   ____________________________________________   I have reviewed the triage vital signs and the nursing notes.   HISTORY  Chief Complaint Shortness of Breath   History limited by: Not Limited   HPI Victor Cortez is a 69 y.o. male who presents to the emergency department today because of concerns for shortness of breath.  Patient states that the shortness of breath woke him up at 3 AM last night.  He feels like he cannot get a deep breath.  Has some slight chest discomfort but no significant pain.  Patient denies any recent trauma.  States the symptoms remind him of when he has had pneumonia in the past.  Denies any recent illness.  Records reviewed. Per medical record review patient has a history of COPD, CAD  Past Medical History:  Diagnosis Date  . Aortic atherosclerosis ([place])    by CT  . Arthritis   . Atrial fibrillation with RVR ([place]) 06/2013   during hospitalization  . Basal cell carcinoma of nose 1998  . CAD (coronary artery disease)    by CT  . Cataract   . Cholelithiasis 10/2017   by CT  . COPD (chronic obstructive pulmonary disease) ([place])   . Dysrhythmia   . ED (erectile dysfunction)   . Ex-smoker   . GERD (gastroesophageal reflux disease)   . Glaucoma    Dr. Matilde Sprang  . Glaucoma   . Hepatic steatosis    by CT  . History of chicken pox   . History of pneumonia 06/2013   with sepsis and [facility] hospitalization  . Hyperlipidemia   . Rosacea   . Seborrheic dermatitis     Patient Active Problem List   Diagnosis Date Noted  . Cholelithiasis 03/01/2019  . Hepatic steatosis 03/01/2019  . Aorto-iliac atherosclerosis ([place]) 03/01/2019  . Lymphangioma 03/01/2019  . Duodenitis determined by biopsy 03/01/2019  . Right hand pain 03/01/2019  . Pre-op evaluation 11/16/2017  . Abnormal CT scan, gallbladder 11/16/2017  . Rash of face 11/16/2017  . Left ureteral stone 11/05/2017  . CAD (coronary  artery disease) 09/29/2016  . Thoracic aortic atherosclerosis ([place]) 09/29/2016  . Medicare annual wellness visit, initial 08/31/2015  . Advanced care planning/counseling discussion 08/31/2015  . Right hydrocele 11/21/2014  . Right anterior shoulder pain 02/23/2014  . Renal insufficiency 02/23/2014  . Ex-smoker 07/11/2013  . COPD (chronic obstructive pulmonary disease) ([place])   . Glaucoma   . Hyperlipidemia   . GERD (gastroesophageal reflux disease)   . History of pneumonia 06/03/2013  . [place] atrial fibrillation with RVR 06/03/2013    Past Surgical History:  Procedure Laterality Date  . CYSTOSCOPY WITH URETHRAL DILATATION  12/04/2017   Procedure: CYSTOSCOPY WITH URETHRAL DILATATION;  Surgeon: Billey Co, MD;  Location: [facility] ORS;  Service: Urology;;  . CYSTOSCOPY/URETEROSCOPY/HOLMIUM LASER/STENT PLACEMENT Left 12/04/2017   Procedure: CYSTOSCOPY/URETEROSCOPY/HOLMIUM LASER/STENT PLACEMENT;  Surgeon: Billey Co, MD;  Location: [facility] ORS;  Service: Urology;  Laterality: Left;  . 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 ROTATOR CUFF REPAIR  2016    Prior to Admission medications   Medication Sig Start Date End Date Taking? Authorizing Provider  aspirin EC 81 MG tablet Take 81 mg by mouth daily. 1 tab by mouth in AM    [provider]  AZOPT 1 % ophthalmic suspension  08/16/18   [provider]  calcium carbonate (TUMS) 500 MG chewable tablet Chew 1 tablet (200 mg of elemental calcium total) by mouth daily as needed for indigestion or heartburn. 02/28/19   Ria Bush, MD  Cyanocobalamin (RA VITAMIN B-12 TR) 1000 MCG TBCR Take 1 tablet by mouth daily. One tab by mouth daily    [provider]  Glucosamine-Chondroit-Vit C-Mn (GLUCOSAMINE 1500 COMPLEX) CAPS Take 1 capsule by mouth daily.    [provider]   Ipratropium-Albuterol (COMBIVENT RESPIMAT) 20-100 MCG/ACT AERS respimat Inhale 1 puff into the lungs every 6 (six) hours as needed for wheezing or shortness of breath. 03/01/19   Ria Bush, MD  latanoprost (XALATAN) 0.005 % ophthalmic solution Place 1 drop into both eyes at bedtime. 05/15/13   [provider]  losartan (COZAAR) 100 MG tablet Take 100 mg by mouth daily.    [provider]  Multiple Vitamin (MULTI-VITAMINS) TABS Take by mouth. 1 tab by mouth daily    [provider]  Omega-3 Fatty Acids (FISH OIL) 1000 MG CAPS Take 1 capsule by mouth daily. 1 cap by mouth daily    [provider]  omeprazole (PRILOSEC) 40 MG capsule Take 1 capsule (40 mg total) by mouth daily. For 3 weeks then as needed 02/28/19   Ria Bush, MD  pravastatin (PRAVACHOL) 80 MG tablet Take 1 tablet (80 mg total) by mouth daily. 10/01/17   Ria Bush, MD  rivaroxaban (XARELTO) 20 MG TABS tablet Take 20 mg by mouth daily with supper.    [provider]  rosuvastatin (CRESTOR) 40 MG tablet Take 40 mg by mouth daily. 10/25/18   [provider]  sotalol (BETAPACE) 80 MG tablet Take 80 mg by mouth 2 (two) times daily. One tab by mouth twice a day 06/07/13   [provider]  tadalafil (ADCIRCA/CIALIS) 20 MG tablet Take 1 tablet (20 mg total) by mouth daily as needed for erectile dysfunction. 01/14/18   Billey Co, MD    Allergies Patient has no known allergies.  Family History  Problem Relation Age of Onset  . COPD Father   . Cancer Father        lung (smoker)  . COPD Mother   . CAD Neg Hx   . Stroke Neg Hx   . Bladder Cancer Neg Hx   . Prostate cancer Neg Hx     Social History Social History   Tobacco Use  . Smoking status: Former Smoker    Packs/day: 2.00    Years: 53.00    Pack years: 106.00    Types: Cigarettes    Quit date: 06/03/2013    Years since quitting: 5.7  . Smokeless tobacco: Never Used  Substance Use Topics   . Alcohol use: No  . Drug use: No    Review of Systems Constitutional: No fever/chills Eyes: No visual changes. ENT: No sore throat. Cardiovascular: Positive for chest discomfort.  Respiratory: Positive for shortness of breath. Gastrointestinal: No abdominal pain.  No nausea, no vomiting.  No diarrhea.   Genitourinary: Negative for dysuria. Musculoskeletal: Negative for back pain. Skin: Negative for rash. Neurological: Negative for headaches, focal weakness or numbness.  ____________________________________________   PHYSICAL EXAM:  VITAL SIGNS: ED Triage Vitals  Enc Vitals Group     BP 03/07/19 2019 (!) 172/84     Pulse Rate 03/07/19 2019 78     Resp 03/07/19 2019 18     Temp 03/07/19 2019 98.2 F (36.8 C)     Temp Source  03/07/19 2019 Oral     SpO2 03/07/19 2019 96 %     Weight 03/07/19 2017 160 lb (72.6 kg)     Height 03/07/19 2017 5\' 6"  (1.676 m)     Head Circumference --      Peak Flow --      Pain Score 03/07/19 2016 7   Constitutional: Alert and oriented.  Eyes: Conjunctivae are normal.  ENT      Head: Normocephalic and atraumatic.      Nose: No congestion/rhinnorhea.      Mouth/Throat: Mucous membranes are moist.      Neck: No stridor. Hematological/Lymphatic/Immunilogical: No cervical lymphadenopathy. Cardiovascular: Normal rate, regular rhythm.  No murmurs, rubs, or gallops.  Respiratory: Normal respiratory effort without tachypnea nor retractions. Diminished breath sounds on left side. Gastrointestinal: Soft and non tender. No rebound. No guarding.  Genitourinary: Deferred Musculoskeletal: Normal range of motion in all extremities. No lower extremity edema. Neurologic:  Normal speech and language. No gross focal neurologic deficits are appreciated.  Skin:  Skin is warm, dry and intact. No rash noted. Psychiatric: Mood and affect are normal. Speech and behavior are normal. Patient exhibits appropriate insight and  judgment.  ____________________________________________    LABS (pertinent positives/negatives)  Trop hs 6 CBC wbc 9.9, hgb 13.2, plt 287 BMP wnl except glu 105, cr 1.33  ____________________________________________   EKG  I, Nance Pear, attending physician, personally viewed and interpreted this EKG  EKG Time: 2016 Rate: 78 Rhythm: normal sinus rhythm Axis: normal Intervals: qtc 458 QRS: narrow ST changes: no st elevation Impression: normal ekg ____________________________________________    RADIOLOGY  CXR Large left pneumothorax  I, Nance Pear, personally discussed these images and results by phone with the on-call radiologist and used this discussion as part of my medical decision making.   ____________________________________________   PROCEDURES  Procedures  CHEST TUBE INSERTION Date: 03/07/2019 Performed by: Nance Pear Consent: Verbal, emergent Required items: required blood products, implants, devices, and special equipment available  Indications: Pneumothorax  Patient sedated: no Anesthesia: no Preparation: skin prepped with Chloraprep  Placement location: Left lateral  Trocanter Tube size: 10 French  Tension pneumothorax heard: no Tube connected to: water seal Drainage characteristics: air  Suture material: 4-0 prolene Dressing: xeroform  Post-insertion x-ray findings: resolution of pneumothorax  Patient tolerance: Patient tolerated the procedure well with no immediate complications     ____________________________________________   INITIAL IMPRESSION / ASSESSMENT AND PLAN / ED COURSE  Pertinent labs & imaging results that were available during my care of the patient were reviewed by me and considered in my medical decision making (see chart for details).   Patient presented to the emergency department today because of concerns for acute onset of shortness of breath.  X-ray is notable for a large left-sided  pneumothorax.  Patient did have a chest tube placed.  Unfortunately the initial attempt resulted in subcutaneous placement.  On second attempt good resolution of the pneumothorax.  Patient will be admitted to the hospital.  Discussed with Dr. Celine Ahr with surgery.  ___________________________________________   FINAL CLINICAL IMPRESSION(S) / ED DIAGNOSES  Final diagnoses:  Pneumothorax, unspecified type     Note: This dictation was prepared with Dragon dictation. Any transcriptional errors that result from this process are unintentional     Nance Pear, MD 03/07/19 2321

## 2019-03-07 NOTE — ED Notes (Signed)
Pt states sob since 3am today.  Pt reports a cough.  No fever.  Hx copd.   No fever.  Denies chest pain.  No n/v/d.  Pt alert speech clear.  Iv in place.

## 2019-03-07 NOTE — ED Notes (Signed)
md in with pt to insert 23fr chest tube into left chest.  nsr on monitor. Pt on 2 liters oxygen nasal cannula.  Pt alert.

## 2019-03-08 ENCOUNTER — Inpatient Hospital Stay: Payer: Medicare Other

## 2019-03-08 ENCOUNTER — Other Ambulatory Visit: Payer: Self-pay

## 2019-03-08 ENCOUNTER — Encounter: Payer: Self-pay | Admitting: Family Medicine

## 2019-03-08 DIAGNOSIS — I482 Chronic atrial fibrillation, unspecified: Secondary | ICD-10-CM

## 2019-03-08 DIAGNOSIS — J939 Pneumothorax, unspecified: Secondary | ICD-10-CM

## 2019-03-08 DIAGNOSIS — J449 Chronic obstructive pulmonary disease, unspecified: Secondary | ICD-10-CM

## 2019-03-08 DIAGNOSIS — I480 Paroxysmal atrial fibrillation: Secondary | ICD-10-CM

## 2019-03-08 LAB — BASIC METABOLIC PANEL
Anion gap: 7 (ref 5–15)
BUN: 19 mg/dL (ref 8–23)
CO2: 26 mmol/L (ref 22–32)
Calcium: 8.8 mg/dL — ABNORMAL LOW (ref 8.9–10.3)
Chloride: 109 mmol/L (ref 98–111)
Creatinine, Ser: 1.16 mg/dL (ref 0.61–1.24)
GFR calc Af Amer: >60 mL/min (ref >60)
GFR calc non Af Amer: >60 mL/min (ref >60)
Glucose, Bld: 103 mg/dL — ABNORMAL HIGH (ref 70–99)
Potassium: 4.3 mmol/L (ref 3.5–5.1)
Sodium: 142 mmol/L (ref 135–145)

## 2019-03-08 LAB — CBC
HCT: 35.6 % — ABNORMAL LOW (ref 39.0–52.0)
Hemoglobin: 11.8 g/dL — ABNORMAL LOW (ref 13.0–17.0)
MCH: 29.1 pg (ref 26.0–34.0)
MCHC: 33.1 g/dL (ref 30.0–36.0)
MCV: 87.9 fL (ref 80.0–100.0)
Platelets: 223 10*3/uL (ref 150–400)
RBC: 4.05 MIL/uL — ABNORMAL LOW (ref 4.22–5.81)
RDW: 13.6 % (ref 11.5–15.5)
WBC: 8.7 10*3/uL (ref 4.0–10.5)
nRBC: 0 % (ref 0.0–0.2)

## 2019-03-08 LAB — SARS CORONAVIRUS 2 (TAT 6-24 HRS): SARS Coronavirus 2: NEGATIVE

## 2019-03-08 MED ORDER — OMEGA-3-ACID ETHYL ESTERS 1 G PO CAPS
1.0000 g | ORAL_CAPSULE | Freq: Every day | ORAL | Status: DC
  Administered 2019-03-08 – 2019-03-10 (×3): 1 g via ORAL
  Filled 2019-03-08 (×3): qty 1

## 2019-03-08 MED ORDER — IPRATROPIUM-ALBUTEROL 20-100 MCG/ACT IN AERS
1.0000 | INHALATION_SPRAY | Freq: Four times a day (QID) | RESPIRATORY_TRACT | Status: DC | PRN
  Administered 2019-03-08: 1 via RESPIRATORY_TRACT
  Filled 2019-03-08 (×2): qty 4

## 2019-03-08 MED ORDER — ENOXAPARIN SODIUM 40 MG/0.4ML ~~LOC~~ SOLN: 40.0000 mg | SUBCUTANEOUS | Status: DC

## 2019-03-08 MED ORDER — TRAZODONE HCL 50 MG PO TABS: 25.0000 mg | ORAL_TABLET | Freq: Every evening | ORAL | Status: DC | PRN

## 2019-03-08 MED ORDER — MAGNESIUM HYDROXIDE 400 MG/5ML PO SUSP: 30.0000 mL | Freq: Every day | ORAL | Status: DC | PRN

## 2019-03-08 MED ORDER — CYANOCOBALAMIN 500 MCG PO TABS
1000.0000 ug | ORAL_TABLET | Freq: Every day | ORAL | Status: DC
  Administered 2019-03-08 – 2019-03-10 (×3): 1000 ug via ORAL
  Filled 2019-03-08 (×3): qty 2

## 2019-03-08 MED ORDER — RIVAROXABAN 20 MG PO TABS
20.0000 mg | ORAL_TABLET | Freq: Every day | ORAL | Status: DC
  Administered 2019-03-08 – 2019-03-10 (×3): 20 mg via ORAL
  Filled 2019-03-08 (×3): qty 1

## 2019-03-08 MED ORDER — GLUCOSAMINE 1500 COMPLEX PO CAPS: 1.0000 | ORAL_CAPSULE | Freq: Every day | ORAL | Status: DC

## 2019-03-08 MED ORDER — LATANOPROST 0.005 % OP SOLN
1.0000 [drp] | Freq: Every day | OPHTHALMIC | Status: DC
  Filled 2019-03-08: qty 2.5

## 2019-03-08 MED ORDER — ONDANSETRON HCL 4 MG PO TABS: 4.0000 mg | ORAL_TABLET | Freq: Four times a day (QID) | ORAL | Status: DC | PRN

## 2019-03-08 MED ORDER — ONDANSETRON HCL 4 MG/2ML IJ SOLN: 4.0000 mg | Freq: Four times a day (QID) | INTRAMUSCULAR | Status: DC | PRN

## 2019-03-08 MED ORDER — ACETAMINOPHEN 325 MG PO TABS
650.0000 mg | ORAL_TABLET | Freq: Four times a day (QID) | ORAL | Status: DC | PRN
  Administered 2019-03-09: 650 mg via ORAL
  Filled 2019-03-08: qty 2

## 2019-03-08 MED ORDER — PANTOPRAZOLE SODIUM 40 MG PO TBEC
40.0000 mg | DELAYED_RELEASE_TABLET | Freq: Every day | ORAL | Status: DC
  Administered 2019-03-08 – 2019-03-10 (×3): 40 mg via ORAL
  Filled 2019-03-08 (×3): qty 1

## 2019-03-08 MED ORDER — ACETAMINOPHEN 650 MG RE SUPP: 650.0000 mg | Freq: Four times a day (QID) | RECTAL | Status: DC | PRN

## 2019-03-08 MED ORDER — ROSUVASTATIN CALCIUM 20 MG PO TABS
40.0000 mg | ORAL_TABLET | Freq: Every day | ORAL | Status: DC
  Administered 2019-03-08 – 2019-03-10 (×3): 40 mg via ORAL
  Filled 2019-03-08: qty 4
  Filled 2019-03-08: qty 2
  Filled 2019-03-08: qty 4
  Filled 2019-03-08 (×2): qty 2
  Filled 2019-03-08: qty 4

## 2019-03-08 MED ORDER — SODIUM CHLORIDE 0.9 % IV SOLN: INTRAVENOUS | Status: DC

## 2019-03-08 MED ORDER — MORPHINE SULFATE (PF) 2 MG/ML IV SOLN
2.0000 mg | INTRAVENOUS | Status: DC | PRN
  Administered 2019-03-08 (×2): 2 mg via INTRAVENOUS
  Filled 2019-03-08 (×2): qty 1

## 2019-03-08 MED ORDER — LOSARTAN POTASSIUM 50 MG PO TABS
100.0000 mg | ORAL_TABLET | Freq: Every day | ORAL | Status: DC
  Administered 2019-03-08 – 2019-03-10 (×3): 100 mg via ORAL
  Filled 2019-03-08 (×3): qty 2

## 2019-03-08 MED ORDER — CHLORHEXIDINE GLUCONATE CLOTH 2 % EX PADS
6.0000 | MEDICATED_PAD | Freq: Every day | CUTANEOUS | Status: DC
  Administered 2019-03-08 – 2019-03-10 (×3): 6 via TOPICAL

## 2019-03-08 MED ORDER — BRINZOLAMIDE 1 % OP SUSP
1.0000 [drp] | Freq: Two times a day (BID) | OPHTHALMIC | Status: DC
  Administered 2019-03-08 – 2019-03-10 (×5): 1 [drp] via OPHTHALMIC
  Filled 2019-03-08: qty 10

## 2019-03-08 MED ORDER — PRAVASTATIN SODIUM 40 MG PO TABS: 80.0000 mg | ORAL_TABLET | Freq: Every day | ORAL | Status: DC

## 2019-03-08 MED ORDER — ASPIRIN EC 81 MG PO TBEC: 81.0000 mg | DELAYED_RELEASE_TABLET | Freq: Every day | ORAL | Status: DC

## 2019-03-08 MED ORDER — ASPIRIN EC 81 MG PO TBEC
81.0000 mg | DELAYED_RELEASE_TABLET | Freq: Every day | ORAL | Status: DC
  Administered 2019-03-08 – 2019-03-10 (×3): 81 mg via ORAL
  Filled 2019-03-08 (×3): qty 1

## 2019-03-08 MED ORDER — KETOROLAC TROMETHAMINE 15 MG/ML IJ SOLN
15.0000 mg | Freq: Four times a day (QID) | INTRAMUSCULAR | Status: DC | PRN
  Administered 2019-03-08 – 2019-03-09 (×2): 15 mg via INTRAVENOUS
  Filled 2019-03-08 (×2): qty 1

## 2019-03-08 MED ORDER — SOTALOL HCL 80 MG PO TABS
80.0000 mg | ORAL_TABLET | Freq: Two times a day (BID) | ORAL | Status: DC
  Administered 2019-03-08 – 2019-03-10 (×5): 80 mg via ORAL
  Filled 2019-03-08 (×7): qty 1

## 2019-03-08 NOTE — H&P (Signed)
[facility] [place] NAME: Victor Cortez    MR#:  [id number]  DATE OF BIRTH:  10-05-1950  DATE OF ADMISSION:  03/07/2019  PRIMARY CARE PHYSICIAN: Ria Bush, MD   REQUESTING/REFERRING PHYSICIAN: Nance Pear, MD CHIEF COMPLAINT:   Chief Complaint  Patient presents with  . Shortness of Breath    HISTORY OF PRESENT ILLNESS:  Victor Cortez  is a 69 y.o. male with a known history of COPD, coronary artery disease, atrial fibrillation, GERD, and dyslipidemia, presented to the emergency room with acute onset of dyspnea that woke him up at 3 AM last night.  He admits to associated chest pain that he graded 6-7/10 in severity with associated palpitations.  It felt like aching and was not associated with nausea vomiting or diaphoresis.  He denied any leg pain or edema recent travels or surgeries.  He admitted to cough and wheezing.  He has been coughing clear expectorate with occasional blood-tinged.  No dysuria, oliguria or hematuria or flank pain.  Upon presentation to the emergency room, blood pressure was 172/84 with otherwise normal vital signs.  Labs revealed a BUN of 19 and creatinine 1.33 and negative troponin I of 6 and later 5.  CBC was unremarkable.  EKG showed normal sinus rhythm with a rate of 78.  Chest x-ray large left pneumothorax with no mediastinal shift.  Repeat chest x-ray showed left-sided chest tube that appeared to be in the overlying soft tissue of left chest wall which was the first attempt. Chest x-ray showed interval adjustment of the left-sided chest tube projecting over the lung apex with residual tiny left-sided pneumothorax.  The patient was given 2 mg of IV morphine sulfate and will be admitted to a medical bed for further evaluation and management. PAST MEDICAL HISTORY:   Past Medical History:  Diagnosis Date  . Aortic atherosclerosis ([place])    by CT  . Arthritis   . Atrial fibrillation with RVR ([place]) 06/2013   during  hospitalization  . Basal cell carcinoma of nose 1998  . CAD (coronary artery disease)    by CT  . Cataract   . Cholelithiasis 10/2017   by CT  . COPD (chronic obstructive pulmonary disease) ([place])   . Dysrhythmia   . ED (erectile dysfunction)   . Ex-smoker   . GERD (gastroesophageal reflux disease)   . Glaucoma    Dr. Matilde Sprang  . Glaucoma   . Hepatic steatosis    by CT  . History of chicken pox   . History of pneumonia 06/2013   with sepsis and [facility] hospitalization  . Hyperlipidemia   . Rosacea   . Seborrheic dermatitis     PAST SURGICAL HISTORY:   Past Surgical History:  Procedure Laterality Date  . CYSTOSCOPY WITH URETHRAL DILATATION  12/04/2017   Procedure: CYSTOSCOPY WITH URETHRAL DILATATION;  Surgeon: Billey Co, MD;  Location: [facility] ORS;  Service: Urology;;  . CYSTOSCOPY/URETEROSCOPY/HOLMIUM LASER/STENT PLACEMENT Left 12/04/2017   Procedure: CYSTOSCOPY/URETEROSCOPY/HOLMIUM LASER/STENT PLACEMENT;  Surgeon: Billey Co, MD;  Location: [facility] ORS;  Service: Urology;  Laterality: Left;  . 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 ROTATOR CUFF REPAIR  2016    SOCIAL HISTORY:   Social History   Tobacco Use  . Smoking status: Former Smoker    Packs/day: 2.00    Years: 53.00    Pack years:  106.00    Types: Cigarettes    Quit date: 06/03/2013    Years since quitting: 5.7  . Smokeless tobacco: Never Used  Substance Use Topics  . Alcohol use: No    FAMILY HISTORY:   Family History  Problem Relation Age of Onset  . COPD Father   . Cancer Father        lung (smoker)  . COPD Mother   . CAD Neg Hx   . Stroke Neg Hx   . Bladder Cancer Neg Hx   . Prostate cancer Neg Hx     DRUG ALLERGIES:  No Known Allergies  REVIEW OF SYSTEMS:   ROS As per history of present illness. All pertinent systems were reviewed above.  Constitutional,  HEENT, cardiovascular, respiratory, GI, GU, musculoskeletal, neuro, psychiatric, endocrine,  integumentary and hematologic systems were reviewed and are otherwise  negative/unremarkable except for positive findings mentioned above in the HPI.   MEDICATIONS AT HOME:   Prior to Admission medications   Medication Sig Start Date End Date Taking? Authorizing Provider  aspirin EC 81 MG tablet Take 81 mg by mouth daily. 1 tab by mouth in AM   Yes [provider]  AZOPT 1 % ophthalmic suspension Place 1 drop into both eyes 2 (two) times daily.  08/16/18  Yes [provider]  Cyanocobalamin (RA VITAMIN B-12 TR) 1000 MCG TBCR Take 1 tablet by mouth daily. One tab by mouth daily   Yes [provider]  Glucosamine-Chondroit-Vit C-Mn (GLUCOSAMINE 1500 COMPLEX) CAPS Take 1 capsule by mouth daily.   Yes [provider]  Ipratropium-Albuterol (COMBIVENT RESPIMAT) 20-100 MCG/ACT AERS respimat Inhale 1 puff into the lungs every 6 (six) hours as needed for wheezing or shortness of breath. 03/01/19  Yes Ria Bush, MD  losartan (COZAAR) 100 MG tablet Take 100 mg by mouth daily.   Yes [provider]  Multiple Vitamin (MULTI-VITAMINS) TABS Take by mouth. 1 tab by mouth daily   Yes [provider]  Omega-3 Fatty Acids (FISH OIL) 1000 MG CAPS Take 1 capsule by mouth daily. 1 cap by mouth daily   Yes [provider]  omeprazole (PRILOSEC) 40 MG capsule Take 1 capsule (40 mg total) by mouth daily. For 3 weeks then as needed 02/28/19  Yes Ria Bush, MD  rivaroxaban (XARELTO) 20 MG TABS tablet Take 20 mg by mouth daily.    Yes [provider]  rosuvastatin (CRESTOR) 40 MG tablet Take 40 mg by mouth daily. 10/25/18  Yes [provider]  sotalol (BETAPACE) 80 MG tablet Take 80 mg by mouth 2 (two) times daily. One tab by mouth twice a day 06/07/13  Yes [provider]  tadalafil (ADCIRCA/CIALIS) 20 MG tablet  Take 1 tablet (20 mg total) by mouth daily as needed for erectile dysfunction. 01/14/18  Yes Billey Co, MD  calcium carbonate (TUMS) 500 MG chewable tablet Chew 1 tablet (200 mg of elemental calcium total) by mouth daily as needed for indigestion or heartburn. Patient not taking: Reported on 03/07/2019 02/28/19   Ria Bush, MD  latanoprost (XALATAN) 0.005 % ophthalmic solution Place 1 drop into both eyes at bedtime. 05/15/13   [provider]  pravastatin (PRAVACHOL) 80 MG tablet Take 1 tablet (80 mg total) by mouth daily. 10/01/17   Ria Bush, MD      VITAL SIGNS:  Blood pressure (!) 162/86, pulse 71, temperature 98.3 F (36.8 C), temperature source Oral, resp. rate 20, height 5\' 6"  (1.676 m), weight  70 kg, SpO2 100 %.  PHYSICAL EXAMINATION:  Physical Exam  GENERAL:  69 y.o.-year-old Caucasian male patient lying in the bed with no acute distress.  EYES: Pupils equal, round, reactive to light and accommodation. No scleral icterus. Extraocular muscles intact.  HEENT: Head atraumatic, normocephalic. Oropharynx and nasopharynx clear.  NECK:  Supple, no jugular venous distention. No thyroid enlargement, no tenderness.  LUNGS: Diminished left basal and midlung zone breath sounds. CARDIOVASCULAR: Regular rate and rhythm, S1, S2 normal. No murmurs, rubs, or gallops.  ABDOMEN: Soft, nondistended, nontender. Bowel sounds present. No organomegaly or mass.  EXTREMITIES: No pedal edema, cyanosis, or clubbing.  NEUROLOGIC: Cranial nerves II through XII are intact. Muscle strength 5/5 in all extremities. Sensation intact. Gait not checked.  PSYCHIATRIC: The patient is alert and oriented x 3.  Normal affect and good eye contact. SKIN: No obvious rash, lesion, or ulcer.   LABORATORY PANEL:   CBC Recent Labs  Lab 03/07/19 2024  WBC 9.9  HGB 13.2  HCT 40.6  PLT 287    ------------------------------------------------------------------------------------------------------------------  Chemistries  Recent Labs  Lab 03/07/19 2024  NA 140  K 4.3  CL 108  CO2 23  GLUCOSE 105*  BUN 19  CREATININE 1.33*  CALCIUM 9.1   ------------------------------------------------------------------------------------------------------------------  Cardiac Enzymes No results for input(s): TROPONINI in the last 168 hours. ------------------------------------------------------------------------------------------------------------------  RADIOLOGY:  DG Chest 2 View  Result Date: 03/07/2019 CLINICAL DATA:  69 year old male with shortness of breath. History of COPD. EXAM: CHEST - 2 VIEW COMPARISON:  Chest radiograph dated 06/04/2013. FINDINGS: There is a large left pneumothorax (greater than 20%) measuring approximately 5.5 cm from the lateral pleural surface. Left lung base atelectasis. There is a background of emphysema. The right lung is clear. No pleural effusion. No mediastinal shift. Atherosclerotic calcification of the aortic arch. No acute osseous pathology. IMPRESSION: Large left pneumothorax.  No mediastinal shift. These results were called by telephone at the time of interpretation on 03/07/2019 at 8:40 pm to provider [facility] , who verbally acknowledged these results. Electronically Signed   By: Anner Crete M.D.   On: 03/07/2019 20:45   DG Chest Portable 1 View  Result Date: 03/07/2019 CLINICAL DATA:  Post chest tube adjustment EXAM: PORTABLE CHEST 1 VIEW COMPARISON:  Radiograph same day 10:24 p.m. FINDINGS: The heart size and mediastinal contours are within normal limits. There is been interval adjustment of the left-sided chest tube which is projecting over the left lung apex. There has been interval re-expansion of the left lung. Only a tiny residual pneumothorax is remaining. The right lung is clear. No acute osseous abnormality. IMPRESSION: Interval  adjustment of left-sided chest tube projecting over the lung apex. Residual tiny left-sided pneumothorax. Electronically Signed   By: Prudencio Pair M.D.   On: 03/07/2019 23:11   DG Chest Portable 1 View  Result Date: 03/07/2019 CLINICAL DATA:  Post chest tube insertion EXAM: PORTABLE CHEST 1 VIEW COMPARISON:  Radiograph same day 8:22 p.m. FINDINGS: The heart size and mediastinal contours are within normal limits. There is a large left-sided pneumothorax again noted measuring approximately a 5.1 cm from the overlying pleural surface. There is been interval left-sided chest tube placement which appears to be in the overlying chest wall tissues. The right lung is clear. IMPRESSION: Interval placement of left-sided chest tube which appears to be in the overlying soft tissues of the left chest wall. Large left pneumothorax These results were called by telephone at the time of interpretation on 03/07/2019 at 10:43 pm  to provider Nance Pear , who verbally acknowledged these results. Electronically Signed   By: Prudencio Pair M.D.   On: 03/07/2019 22:43      IMPRESSION AND PLAN:  1.  Large left pneumothorax that could be related to rupture emphysematous bleb with COPD, status post left chest tube placement with radiographic improvement.  The patient will be admitted to a medical bed.  We will follow chest x-ray in a.m.  A general surgery consultation will be obtained.  Dr. Celine Ahr was notified and is aware about the patient.  2.  COPD.  He does not seem to be in acute exacerbation.  We will place him on as needed Combivent.  3.  Dyslipidemia.  We will continue statin therapy.  4.  Paroxysmal atrial fibrillation.  We will continue sotalol and Xarelto  5.  GERD.  We will continue PPI therapy.  6.  Vitamin B12 deficiency.  We will continue vitamin B12.  7.  DVT prophylaxis.  Continue Xarelto   All the records are reviewed and case discussed with ED provider. The plan of care was discussed in details  with the patient (and family). I answered all questions. The patient agreed to proceed with the above mentioned plan. Further management will depend upon hospital course.   CODE STATUS: Full code  TOTAL TIME TAKING CARE OF THIS PATIENT: 55 minutes.    Christel Mormon M.D on 03/08/2019 at 3:36 AM  [facility]   From 7 PM-7 AM, contact night-coverage [email or web address]  CC: Primary care physician; Ria Bush, MD   Note: This dictation was prepared with Dragon dictation along with smaller phrase technology. Any transcriptional errors that result from this process are unintentional.

## 2019-03-08 NOTE — Plan of Care (Signed)
Phlebotomist to come and collect blood work needed for patient.

## 2019-03-08 NOTE — ED Notes (Signed)
Report called to jessica rn floor nurse 

## 2019-03-08 NOTE — Progress Notes (Signed)
PHARMACIST - PHYSICIAN ORDER COMMUNICATION  CONCERNING: P&T Medication Policy on Herbal Medications  DESCRIPTION:  This patient's order for:  glucosamine  has been noted.  This product(s) is classified as an "herbal" or natural product. Due to a lack of definitive safety studies or FDA approval, nonstandard manufacturing practices, plus the potential risk of unknown drug-drug interactions while on inpatient medications, the Pharmacy and Therapeutics Committee does not permit the use of "herbal" or natural products of this type within [place].   ACTION TAKEN: The pharmacy department is unable to verify this order at this time and your patient has been informed of this safety policy. Please reevaluate patient's clinical condition at discharge and address if the herbal or natural product(s) should be resumed at that time.   

## 2019-03-08 NOTE — Consult Note (Signed)
[facility] SURGICAL CONSULTATION NOTE (initial) - [id number]   HISTORY OF PRESENT ILLNESS (HPI):  69 y.o. male presented to [facility] ED on 02/01 for evaluation of SOB. Patient reports that around 3AM last night he noticed the acute onset of shortness of breath. This came on suddenly while he was at rest. He noticed a slight discomfort in the left side of his chest and a cough. No trauma prior to the onset of symptoms. He has a history of PNA and these symptoms felt similar to that. No fever, chills, nausea, emesis, or additional symptoms. No recent travel. He did smoke for about 50 years and quit 6 years ago. Work up in the ED was concerning for mild AKI and a left sided pneumothorax. Chest tube was placed by EDP and patient was admitted to medicine service.   Surgery is consulted by hospitalist physician Dr. Eugenie Norrie, MD in this context for evaluation and management of left pneumothorax.   PAST MEDICAL HISTORY (PMH):  Past Medical History:  Diagnosis Date  . Aortic atherosclerosis ([place])    by CT  . Arthritis   . Atrial fibrillation with RVR ([place]) 06/2013   during hospitalization  . Basal cell carcinoma of nose 1998  . CAD (coronary artery disease)    by CT  . Cataract   . Cholelithiasis 10/2017   by CT  . COPD (chronic obstructive pulmonary disease) ([place])   . Dysrhythmia   . ED (erectile dysfunction)   . Ex-smoker   . GERD (gastroesophageal reflux disease)   . Glaucoma    Dr. Matilde Sprang  . Glaucoma   . Hepatic steatosis    by CT  . History of chicken pox   . History of pneumonia 06/2013   with sepsis and [facility] hospitalization  . Hyperlipidemia   . Rosacea   . Seborrheic dermatitis      PAST SURGICAL HISTORY ([place]):  Past Surgical History:  Procedure Laterality Date  . CYSTOSCOPY WITH URETHRAL DILATATION  12/04/2017   Procedure: CYSTOSCOPY WITH URETHRAL DILATATION;  Surgeon: Billey Co, MD;  Location: [facility] ORS;  Service: Urology;;  .  CYSTOSCOPY/URETEROSCOPY/HOLMIUM LASER/STENT PLACEMENT Left 12/04/2017   Procedure: CYSTOSCOPY/URETEROSCOPY/HOLMIUM LASER/STENT PLACEMENT;  Surgeon: Billey Co, MD;  Location: [facility] ORS;  Service: Urology;  Laterality: Left;  . 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 ROTATOR CUFF REPAIR  2016     MEDICATIONS:  Prior to Admission medications   Medication Sig Start Date End Date Taking? Authorizing Provider  aspirin EC 81 MG tablet Take 81 mg by mouth daily. 1 tab by mouth in AM   Yes [provider]  AZOPT 1 % ophthalmic suspension Place 1 drop into both eyes 2 (two) times daily.  08/16/18  Yes [provider]  Cyanocobalamin (RA VITAMIN B-12 TR) 1000 MCG TBCR Take 1 tablet by mouth daily. One tab by mouth daily   Yes [provider]  Glucosamine-Chondroit-Vit C-Mn (GLUCOSAMINE 1500 COMPLEX) CAPS Take 1 capsule by mouth daily.   Yes [provider]  Ipratropium-Albuterol (COMBIVENT RESPIMAT) 20-100 MCG/ACT AERS respimat Inhale 1 puff into the lungs every 6 (six) hours as needed for wheezing or shortness of breath. 03/01/19  Yes Ria Bush, MD  losartan (COZAAR) 100 MG tablet Take 100 mg by mouth daily.   Yes [provider]  Multiple Vitamin (MULTI-VITAMINS) TABS Take by mouth. 1 tab  by mouth daily   Yes [provider]  Omega-3 Fatty Acids (FISH OIL) 1000 MG CAPS Take 1 capsule by mouth daily. 1 cap by mouth daily   Yes [provider]  omeprazole (PRILOSEC) 40 MG capsule Take 1 capsule (40 mg total) by mouth daily. For 3 weeks then as needed 02/28/19  Yes Ria Bush, MD  rivaroxaban (XARELTO) 20 MG TABS tablet Take 20 mg by mouth daily.    Yes [provider]  rosuvastatin (CRESTOR) 40 MG tablet Take 40 mg by mouth daily. 10/25/18  Yes [provider]  sotalol  (BETAPACE) 80 MG tablet Take 80 mg by mouth 2 (two) times daily. One tab by mouth twice a day 06/07/13  Yes [provider]  tadalafil (ADCIRCA/CIALIS) 20 MG tablet Take 1 tablet (20 mg total) by mouth daily as needed for erectile dysfunction. 01/14/18  Yes Billey Co, MD  calcium carbonate (TUMS) 500 MG chewable tablet Chew 1 tablet (200 mg of elemental calcium total) by mouth daily as needed for indigestion or heartburn. Patient not taking: Reported on 03/07/2019 02/28/19   Ria Bush, MD  latanoprost (XALATAN) 0.005 % ophthalmic solution Place 1 drop into both eyes at bedtime. 05/15/13   [provider]  pravastatin (PRAVACHOL) 80 MG tablet Take 1 tablet (80 mg total) by mouth daily. 10/01/17   Ria Bush, MD     ALLERGIES:  No Known Allergies   SOCIAL HISTORY:  Social History   Socioeconomic History  . Marital status: Married    Spouse name: Not on file  . Number of children: 1  . Years of education: Not on file  . Highest education level: Not on file  Occupational History  . Not on file  Tobacco Use  . Smoking status: Former Smoker    Packs/day: 2.00    Years: 53.00    Pack years: 106.00    Types: Cigarettes    Quit date: 06/03/2013    Years since quitting: 5.7  . Smokeless tobacco: Never Used  Substance and Sexual Activity  . Alcohol use: No  . Drug use: No  . Sexual activity: Not on file  Other Topics Concern  . 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 Difficulty of Paying Living Expenses: Not on file  Food Insecurity:   . Worried About Charity fundraiser in the Last Year: Not on file  . Ran Out of Food in the Last Year: Not on file  Transportation Needs:   .  Lack of Transportation (Medical): Not on file  . Lack of Transportation (Non-Medical): Not on file  Physical Activity:   . Days of Exercise per Week: Not on file  . Minutes of Exercise per Session: Not on file  Stress:   . Feeling of Stress : Not on file  Social Connections:   . Frequency of Communication with Friends and Family: Not on file  . Frequency of Social Gatherings with Friends and Family: Not on file  . Attends Religious Services: Not on file  . Active Member of Clubs or Organizations: Not on file  . Attends Archivist Meetings: Not on file  . Marital Status: Not on  file  Intimate Partner Violence:   . Fear of Current or Ex-Partner: Not on file  . Emotionally Abused: Not on file  . Physically Abused: Not on file  . Sexually Abused: Not on file     FAMILY HISTORY:  Family History  Problem Relation Age of Onset  . COPD Father   . Cancer Father        lung (smoker)  . COPD Mother   . CAD Neg Hx   . Stroke Neg Hx   . Bladder Cancer Neg Hx   . Prostate cancer Neg Hx       REVIEW OF SYSTEMS:  Review of Systems  Constitutional: Negative for chills and fever.  HENT: Negative for congestion and sore throat.   Respiratory: Positive for cough and shortness of breath. Negative for sputum production and wheezing.   Cardiovascular: Positive for chest pain. Negative for palpitations.  Gastrointestinal: Negative for abdominal pain, diarrhea, nausea and vomiting.  All other systems reviewed and are negative.   VITAL SIGNS:  Temp:  [97.8 F (36.6 C)-98.3 F (36.8 C)] 97.8 F (36.6 C) (02/02 0455) Pulse Rate:  [66-83] 71 (02/02 0455) Resp:  [15-22] 20 (02/02 0455) BP: (118-188)/(76-100) 156/83 (02/02 0455) SpO2:  [96 %-100 %] 100 % (02/02 0455) Weight:  [70 kg-72.6 kg] 70 kg (02/02 0121)     Height: 5\' 6"  (167.6 cm) Weight: 70 kg BMI (Calculated): 24.92   INTAKE/OUTPUT:  02/01 0701 - 02/02 0700 In: 0  Out: 200 [Urine:200]  PHYSICAL EXAM:  Physical  Exam Vitals and nursing note reviewed.  Constitutional:      Appearance: He is well-developed and normal weight.  HENT:     Head: Normocephalic and atraumatic.  Cardiovascular:     Rate and Rhythm: Normal rate.     Pulses: Normal pulses.     Heart sounds: Normal heart sounds. No murmur. No gallop.   Pulmonary:     Effort: Pulmonary effort is normal.     Breath sounds: Decreased breath sounds present. No wheezing or rhonchi.     Comments: Breath sounds are distant throughout all fields but sound equal without obvious rhonchi or wheezing Chest:     Comments: Chest tube to the left lateral chest wall, dressing in place, no appreciable air leak present Abdominal:     Palpations: Abdomen is soft.     Tenderness: There is no abdominal tenderness.  Genitourinary:    Comments: Deferred Musculoskeletal:        General: Normal range of motion.     Right lower leg: No edema.     Left lower leg: No edema.  Skin:    General: Skin is warm and dry.  Neurological:     General: No focal deficit present.     Mental Status: He is alert and oriented to person, place, and time.  Psychiatric:        Mood and Affect: Mood normal.        Behavior: Behavior normal.      Labs:  CBC Latest Ref Rng & Units 03/08/2019 03/07/2019 11/16/2017  WBC 4.0 - 10.5 K/uL 8.7 9.9 6.8  Hemoglobin 13.0 - 17.0 g/dL 11.8(L) 13.2 13.4  Hematocrit 39.0 - 52.0 % 35.6(L) 40.6 39.8  Platelets 150 - 400 K/uL 223 287 289.0   CMP Latest Ref Rng & Units 03/08/2019 03/07/2019 10/17/2018  Glucose 70 - 99 mg/dL 103(H) 105(H) -  BUN 8 - 23 mg/dL 19 19 -  Creatinine 0.61 - 1.24 mg/dL 1.16  1.33(H) 1.50(H)  Sodium 135 - 145 mmol/L 142 140 -  Potassium 3.5 - 5.1 mmol/L 4.3 4.3 -  Chloride 98 - 111 mmol/L 109 108 -  CO2 22 - 32 mmol/L 26 23 -  Calcium 8.9 - 10.3 mg/dL 8.8(L) 9.1 -  Total Protein 6.0 - 8.3 g/dL - - -  Total Bilirubin 0.2 - 1.2 mg/dL - - -  Alkaline Phos 39 - 117 U/L - - -  AST 0 - 37 U/L - - -  ALT 0 - 53 U/L - - -      Imaging studies:   CXR (03/07/2019) personally reviewed showing left sided pneumothorax, and radiologist report reviewed:  IMPRESSION: Large left pneumothorax.  No mediastinal shift.   CXR (03/07/2019) personally reviewed, showing resolution in left sided pneumothorax s/p chest tube placement, and radiologist report reviewed:  IMPRESSION: Interval adjustment of left-sided chest tube projecting over the lung apex. Residual tiny left-sided pneumothorax.   Assessment/Plan: (ICD-10's: J93.9) 69 y.o. male with spontaneous left sided pneumothorax most likely secondary to ruptured bleb, complicated by pertinent comorbidities including COPD and a history of tobacco abuse.   - Continue chest tube to -20 cm of suction for today   - Repeat CXR in AM   - Pulmonary toilet  - Mobilize if tolerates  - further management per primary service   All of the above findings and recommendations were discussed with the patient, and all of patient's questions were answered to his expressed satisfaction.  Thank you for the opportunity to participate in this patient's care.   -- Edison Simon, PA-C [facility] 03/08/2019, 7:24 AM [phone] M-F: 7am - 4pm

## 2019-03-08 NOTE — Progress Notes (Signed)
[place] [facility] [place] NAME: Victor Cortez    MR#:  [id number]  DATE OF BIRTH:  1950-07-31  SUBJECTIVE:  patient came in with acute onset shortness of breath in the middle of the night patient feeling better after getting a chest tube placed. Some local soreness. Tolerating PO diet  REVIEW OF SYSTEMS:   Review of Systems  Constitutional: Negative for chills, fever and weight loss.  HENT: Negative for ear discharge, ear pain and nosebleeds.   Eyes: Negative for blurred vision, pain and discharge.  Respiratory: Positive for shortness of breath. Negative for sputum production, wheezing and stridor.   Cardiovascular: Positive for chest pain. Negative for palpitations, orthopnea and PND.  Gastrointestinal: Negative for abdominal pain, diarrhea, nausea and vomiting.  Genitourinary: Negative for frequency and urgency.  Musculoskeletal: Negative for back pain and joint pain.  Neurological: Negative for sensory change, speech change, focal weakness and weakness.  Psychiatric/Behavioral: Negative for depression and hallucinations. The patient is not nervous/anxious.    Tolerating Diet:yes Tolerating PT: does not need  DRUG ALLERGIES:  No Known Allergies  VITALS:  Blood pressure (!) 141/89, pulse 83, temperature 97.8 F (36.6 C), temperature source Oral, resp. rate 20, height 5\' 6"  (1.676 m), weight 70 kg, SpO2 97 %.  PHYSICAL EXAMINATION:   Physical Exam  GENERAL:  69 y.o.-year-old patient lying in the bed with no acute distress. thin EYES: Pupils equal, round, reactive to light and accommodation. No scleral icterus.   HEENT: Head atraumatic, normocephalic. Oropharynx and nasopharynx clear.  NECK:  Supple, no jugular venous distention. No thyroid enlargement, no tenderness.  LUNGS: Normal breath sounds bilaterally, no wheezing, rales, rhonchi. No use of accessory muscles of respiration.  Left sided CT+ CARDIOVASCULAR: S1, S2 normal. No  murmurs, rubs, or gallops.  ABDOMEN: Soft, nontender, nondistended. Bowel sounds present. No organomegaly or mass.  EXTREMITIES: No cyanosis, clubbing or edema b/l.    NEUROLOGIC: Cranial nerves II through XII are intact. No focal Motor or sensory deficits b/l.   PSYCHIATRIC:  patient is alert and oriented x 3.  SKIN: No obvious rash, lesion, or ulcer.   LABORATORY PANEL:  CBC Recent Labs  Lab 03/08/19 0447  WBC 8.7  HGB 11.8*  HCT 35.6*  PLT 223    Chemistries  Recent Labs  Lab 03/08/19 0447  NA 142  K 4.3  CL 109  CO2 26  GLUCOSE 103*  BUN 19  CREATININE 1.16  CALCIUM 8.8*   Cardiac Enzymes No results for input(s): TROPONINI in the last 168 hours. RADIOLOGY:  DG Chest 2 View  Result Date: 03/07/2019 CLINICAL DATA:  69 year old male with shortness of breath. History of COPD. EXAM: CHEST - 2 VIEW COMPARISON:  Chest radiograph dated 06/04/2013. FINDINGS: There is a large left pneumothorax (greater than 20%) measuring approximately 5.5 cm from the lateral pleural surface. Left lung base atelectasis. There is a background of emphysema. The right lung is clear. No pleural effusion. No mediastinal shift. Atherosclerotic calcification of the aortic arch. No acute osseous pathology. IMPRESSION: Large left pneumothorax.  No mediastinal shift. These results were called by telephone at the time of interpretation on 03/07/2019 at 8:40 pm to provider [facility] , who verbally acknowledged these results. Electronically Signed   By: Anner Crete M.D.   On: 03/07/2019 20:45   DG Chest Portable 1 View  Result Date: 03/07/2019 CLINICAL DATA:  Post chest tube adjustment EXAM: PORTABLE CHEST 1 VIEW COMPARISON:  Radiograph same day  10:24 p.m. FINDINGS: The heart size and mediastinal contours are within normal limits. There is been interval adjustment of the left-sided chest tube which is projecting over the left lung apex. There has been interval re-expansion of the left lung. Only a tiny  residual pneumothorax is remaining. The right lung is clear. No acute osseous abnormality. IMPRESSION: Interval adjustment of left-sided chest tube projecting over the lung apex. Residual tiny left-sided pneumothorax. Electronically Signed   By: Prudencio Pair M.D.   On: 03/07/2019 23:11   DG Chest Portable 1 View  Result Date: 03/07/2019 CLINICAL DATA:  Post chest tube insertion EXAM: PORTABLE CHEST 1 VIEW COMPARISON:  Radiograph same day 8:22 p.m. FINDINGS: The heart size and mediastinal contours are within normal limits. There is a large left-sided pneumothorax again noted measuring approximately a 5.1 cm from the overlying pleural surface. There is been interval left-sided chest tube placement which appears to be in the overlying chest wall tissues. The right lung is clear. IMPRESSION: Interval placement of left-sided chest tube which appears to be in the overlying soft tissues of the left chest wall. Large left pneumothorax These results were called by telephone at the time of interpretation on 03/07/2019 at 10:43 pm to provider [facility] , who verbally acknowledged these results. Electronically Signed   By: Prudencio Pair M.D.   On: 03/07/2019 22:43   ASSESSMENT AND PLAN:   Victor Cortez  is a 69 y.o. male with a known history of COPD, coronary artery disease, atrial fibrillation, GERD, and dyslipidemia, presented to the emergency room with acute onset of dyspnea that woke him up at 3 AM last night.  He admits to associated chest pain that he graded 6-7/10 in severity with associated palpitations.  1. Large left pneumothorax that could be related to spontaneous rupture emphysematous bleb with COPD, status post left chest tube placement with radiographic improvement.   -CT was placed by ER MD -Dr Genevive Bi to follow pt -serial cxr's -prn pain meds -wean oxygen as able to  2.  COPD.  - He does not seem to be in acute exacerbation.   -prn  Combivent. -Ex-smoker  3.  Dyslipidemia.   - continue  statin therapy.  4.  Paroxysmal atrial fibrillation.  -on  sotalol and Xarelto -HR stable  5.  GERD. -on PPI therapy.  6.  DVT prophylaxis.  Continue Xarelto  Procedures:left side CT tube Family communication :patient in the room Consults :cardiothoracic surgery Dr Genevive Bi Discharge Disposition :home CODE STATUS: FULL DVT Prophylaxis :Xarelto Barriers to discharge: awaiting resolution of PTX  TOTAL TIME TAKING CARE OF THIS PATIENT: 25 minutes.  >50% time spent on counselling and coordination of care  Note: This dictation was prepared with Dragon dictation along with smaller phrase technology. Any transcriptional errors that result from this process are unintentional.  Fritzi Mandes M.D    [facility]   CC: Primary care physician; Ria Bush, MDPatient ID: Victor Cortez, male   DOB: 29-Jun-1950, 68 y.o.   MRN: [id number]

## 2019-03-09 ENCOUNTER — Inpatient Hospital Stay: Payer: Medicare Other

## 2019-03-09 LAB — HIV ANTIBODY (ROUTINE TESTING W REFLEX): HIV Screen 4th Generation wRfx: NONREACTIVE — AB

## 2019-03-09 NOTE — Discharge Instructions (Signed)
Pneumothorax A pneumothorax is commonly called a collapsed lung. It is a condition in which air leaks from a lung and builds up between the thin layer of tissue that covers the lungs (visceral pleura) and the interior wall of the chest cavity (parietal pleura). The air gets trapped outside the lung, between the lung and the chest wall (pleural space). The air takes up space and prevents the lung from fully expanding. This condition sometimes occurs suddenly with no apparent cause. The buildup of air may be small or large. A small pneumothorax may go away on its own. A large pneumothorax will require treatment and hospitalization. What are the causes? This condition may be caused by:  Trauma and injury to the chest wall.  Surgery and other medical procedures.  A complication of an underlying lung problem, especially chronic obstructive pulmonary disease (COPD) or emphysema. Sometimes the cause of this condition is not known. What increases the risk? You are more likely to develop this condition if:  You have an underlying lung problem.  You smoke.  You are 20-40 years old, male, tall, and underweight.  You have a personal or family history of pneumothorax.  You have an eating disorder (anorexia nervosa). This condition can also happen quickly, even in people with no history of lung problems. What are the signs or symptoms? Sometimes a pneumothorax will have no symptoms. When symptoms are present, they can include:  Chest pain.  Shortness of breath.  Increased rate of breathing.  Bluish color to your lips or skin (cyanosis). How is this diagnosed? This condition may be diagnosed by:  A medical history and physical exam.  A chest X-ray, chest CT scan, or ultrasound. How is this treated? Treatment depends on how severe your condition is. The goal of treatment is to remove the extra air and allow your lung to expand back to its normal size.  For a small pneumothorax: ? No  treatment may be needed. ? Extra oxygen is sometimes used to make it go away more quickly.  For a large pneumothorax or a pneumothorax that is causing symptoms, a procedure is done to drain the air from your lungs. To do this, a health care provider may use: ? A needle with a syringe. This is used to suck air from a pleural space where no additional leakage is taking place. ? A chest tube. This is used to suck air where there is ongoing leakage into the pleural space. The chest tube may need to remain in place for several days until the air leak has healed.  In more severe cases, surgery may be needed to repair the damage that is causing the leak.  If you have multiple pneumothorax episodes or have an air leak that will not heal, a procedure called a pleurodesis may be done. A medicine is placed in the pleural space to irritate the tissues around the lung so that the lung will stick to the chest wall, seal any leaks, and stop any buildup of air in that space. If you have an underlying lung problem, severe symptoms, or a large pneumothorax you will usually need to stay in the hospital. Follow these instructions at home: Lifestyle  Do not use any products that contain nicotine or tobacco, such as cigarettes and e-cigarettes. These are major risk factors in pneumothorax. If you need help quitting, ask your health care provider.  Do not lift anything that is heavier than 10 lb (4.5 kg), or the limit that your health care   provider tells you, until he or she says that it is safe.  Avoid activities that take a lot of effort (strenuous) for as long as told by your health care provider.  Return to your normal activities as told by your health care provider. Ask your health care provider what activities are safe for you.  Do not fly in an airplane or scuba dive until your health care provider says it is okay. General instructions  Take over-the-counter and prescription medicines only as told by your  health care provider.  If a cough or pain makes it difficult for you to sleep at night, try sleeping in a semi-upright position in a recliner or by using 2 or 3 pillows.  If you had a chest tube and it was removed, ask your health care provider when you can remove the bandage (dressing). While the dressing is in place, do not allow it to get wet.  Keep all follow-up visits as told by your health care provider. This is important. Contact a health care provider if:  You cough up thick mucus (sputum) that is yellow or green in color.  You were treated with a chest tube, and you have redness, increasing pain, or discharge at the site where it was placed. Get help right away if:  You have increasing chest pain or shortness of breath.  You have a cough that will not go away.  You begin coughing up blood.  You have pain that is getting worse or is not controlled with medicines.  The site where your chest tube was located opens up.  You feel air coming out of the site where the chest tube was placed.  You have a fever or persistent symptoms for more than 2-3 days.  You have a fever and your symptoms suddenly get worse. These symptoms may represent a serious problem that is an emergency. Do not wait to see if the symptoms will go away. Get medical help right away. Call your local emergency services (911 in the [place] Do not drive yourself to the hospital. Summary  A pneumothorax, commonly called a collapsed lung, is a condition in which air leaks from a lung and gets trapped between the lung and the chest wall (pleural space).  The buildup of air may be small or large. A small pneumothorax may go away on its own. A large pneumothorax will require treatment and hospitalization.  Treatment for this condition depends on how severe the pneumothorax is. The goal of treatment is to remove the extra air and allow the lung to expand back to its normal size. This information is not intended to  replace advice given to you by your health care provider. Make sure you discuss any questions you have with your health care provider. Document Revised: 01/02/2017 Document Reviewed: 12/29/2016 Elsevier Patient Education  2020 [employer]  

## 2019-03-09 NOTE — Progress Notes (Signed)
Patient's oxygen saturation is 94% on room air and patient is ambulating with ease around room using front wheel walker.   Marry Guan, RN   03/09/2019   5:42 PM

## 2019-03-09 NOTE — Progress Notes (Signed)
[facility] SURGICAL PROGRESS NOTE (cpt [phone])  Hospital Day(s): 2.   Interval History: Patient seen and examined, no acute events or new complaints overnight. Patient reports he is feeling better this morning. He reports that his breathing is improved. Mild discomfort at chest tube site. No fever or chills. Minimal chest tube output. CXR without pneumothorax.   Review of Systems:  Constitutional: denies fever, chills  HEENT: denies cough or congestion  Respiratory: denies any shortness of breath  Cardiovascular: denies chest pain or palpitations  Gastrointestinal: denies abdominal pain, N/V, or diarrhea/and bowel function as per interval history Genitourinary: denies burning with urination or urinary frequency   Vital signs in last 24 hours: [min-max] current  Temp:  [97.6 F (36.4 C)-97.8 F (36.6 C)] 97.6 F (36.4 C) (02/03 0538) Pulse Rate:  [70-83] 70 (02/03 0538) Resp:  [16-20] 20 (02/03 0538) BP: (124-148)/(61-89) 124/65 (02/03 0538) SpO2:  [97 %-100 %] 98 % (02/03 0538)     Height: 5\' 6"  (167.6 cm) Weight: 70 kg BMI (Calculated): 24.92   Intake/Output last 2 shifts:  02/02 0701 - 02/03 0700 In: 0  Out: 400 [Urine:400]   Physical Exam:  Constitutional: alert, cooperative and no distress  HENT: normocephalic without obvious abnormality  Eyes: PERRL, EOM's grossly intact and symmetric  Respiratory: breathing non-labored at rest  Cardiovascular: regular rate and sinus rhythm  Chest: Chest tube to the left chest wall, dried blood present, no appreciable air leak on pleurovac. Serosanguinous output.    Labs:  CBC Latest Ref Rng & Units 03/08/2019 03/07/2019 11/16/2017  WBC 4.0 - 10.5 K/uL 8.7 9.9 6.8  Hemoglobin 13.0 - 17.0 g/dL 11.8(L) 13.2 13.4  Hematocrit 39.0 - 52.0 % 35.6(L) 40.6 39.8  Platelets 150 - 400 K/uL 223 287 289.0   CMP Latest Ref Rng & Units 03/08/2019 03/07/2019 10/17/2018  Glucose 70 - 99 mg/dL 103(H) 105(H) -  BUN 8 - 23 mg/dL 19 19 -   Creatinine 0.61 - 1.24 mg/dL 1.16 1.33(H) 1.50(H)  Sodium 135 - 145 mmol/L 142 140 -  Potassium 3.5 - 5.1 mmol/L 4.3 4.3 -  Chloride 98 - 111 mmol/L 109 108 -  CO2 22 - 32 mmol/L 26 23 -  Calcium 8.9 - 10.3 mg/dL 8.8(L) 9.1 -  Total Protein 6.0 - 8.3 g/dL - - -  Total Bilirubin 0.2 - 1.2 mg/dL - - -  Alkaline Phos 39 - 117 U/L - - -  AST 0 - 37 U/L - - -  ALT 0 - 53 U/L - - -    Imaging studies:  CXR (03/09/2019) personally reviewed without evidence of pneumothorax   Assessment/Plan: (ICD-10's: J93.9) 69 y.o. male with resolved spontaneous left sided pneumothorax most likely secondary to ruptured bleb, complicated by pertinent comorbidities including COPD and a history of tobacco abuse.   - Will place chest tube to waterseal today and repeat CXR at 1300. If his CXR is without pneumothorax then we will remove the chest tube and he can be discharged from surgery standpoint.    - Pulmonary toilet             - Mobilize if tolerates             - further management per primary service    All of the above findings and recommendations were discussed with the patient, and the medical team, and all of patient's questions were answered to his expressed satisfaction.  -- Edison Simon, PA-C [facility] 03/09/2019, 7:15 AM  [phone] M-F: 7am - 4pm

## 2019-03-09 NOTE — Progress Notes (Signed)
[place] [facility] [place] NAME: Victor Cortez    MR#:  [id number]  DATE OF BIRTH:  1951-01-25  SUBJECTIVE:  patient came in with acute onset shortness of breath in the middle of the night patient feeling better after getting a chest tube placed. Some local soreness. Tolerating PO diet  REVIEW OF SYSTEMS:   Review of Systems  Constitutional: Negative for chills, fever and weight loss.  HENT: Negative for ear discharge, ear pain and nosebleeds.   Eyes: Negative for blurred vision, pain and discharge.  Respiratory: Positive for shortness of breath. Negative for sputum production, wheezing and stridor.   Cardiovascular: Positive for chest pain. Negative for palpitations, orthopnea and PND.  Gastrointestinal: Negative for abdominal pain, diarrhea, nausea and vomiting.  Genitourinary: Negative for frequency and urgency.  Musculoskeletal: Negative for back pain and joint pain.  Neurological: Negative for sensory change, speech change, focal weakness and weakness.  Psychiatric/Behavioral: Negative for depression and hallucinations. The patient is not nervous/anxious.    Tolerating Diet:yes Tolerating PT: does not need  DRUG ALLERGIES:  No Known Allergies  VITALS:  Blood pressure (!) 150/73, pulse 67, temperature 97.7 F (36.5 C), temperature source Oral, resp. rate (!) 24, height 5\' 6"  (1.676 m), weight 70 kg, SpO2 94 %.  PHYSICAL EXAMINATION:   Physical Exam  GENERAL:  69 y.o.-year-old patient lying in the bed with no acute distress. thin EYES: Pupils equal, round, reactive to light and accommodation. No scleral icterus.   HEENT: Head atraumatic, normocephalic. Oropharynx and nasopharynx clear.  NECK:  Supple, no jugular venous distention. No thyroid enlargement, no tenderness.  LUNGS: Normal breath sounds bilaterally, no wheezing, rales, rhonchi. No use of accessory muscles of respiration.  Left sided CT+ CARDIOVASCULAR: S1, S2 normal. No  murmurs, rubs, or gallops.  ABDOMEN: Soft, nontender, nondistended. Bowel sounds present. No organomegaly or mass.  EXTREMITIES: No cyanosis, clubbing or edema b/l.    NEUROLOGIC: Cranial nerves II through XII are intact. No focal Motor or sensory deficits b/l.   PSYCHIATRIC:  patient is alert and oriented x 3.  SKIN: No obvious rash, lesion, or ulcer.   LABORATORY PANEL:  CBC Recent Labs  Lab 03/08/19 0447  WBC 8.7  HGB 11.8*  HCT 35.6*  PLT 223    Chemistries  Recent Labs  Lab 03/08/19 0447  NA 142  K 4.3  CL 109  CO2 26  GLUCOSE 103*  BUN 19  CREATININE 1.16  CALCIUM 8.8*   Cardiac Enzymes No results for input(s): TROPONINI in the last 168 hours. RADIOLOGY:  DG Chest 1 View  Result Date: 03/08/2019 CLINICAL DATA:  Chest tube EXAM: CHEST  1 VIEW COMPARISON:  03/07/2019. FINDINGS: Left chest tube remains in place. No pneumothorax. Mild left lower lobe airspace disease has improved in the interval. Small left effusion has improved. Right lung remains clear. IMPRESSION: Left chest tube in place without pneumothorax Improved aeration left lung base. Electronically Signed   By: Franchot Gallo M.D.   On: 03/08/2019 16:46   DG Chest 2 View  Result Date: 03/07/2019 CLINICAL DATA:  68 year old male with shortness of breath. History of COPD. EXAM: CHEST - 2 VIEW COMPARISON:  Chest radiograph dated 06/04/2013. FINDINGS: There is a large left pneumothorax (greater than 20%) measuring approximately 5.5 cm from the lateral pleural surface. Left lung base atelectasis. There is a background of emphysema. The right lung is clear. No pleural effusion. No mediastinal shift. Atherosclerotic calcification of the aortic arch.  No acute osseous pathology. IMPRESSION: Large left pneumothorax.  No mediastinal shift. These results were called by telephone at the time of interpretation on 03/07/2019 at 8:40 pm to provider [facility] , who verbally acknowledged these results. Electronically Signed    By: Anner Crete M.D.   On: 03/07/2019 20:45   DG Chest Port 1 View  Result Date: 03/09/2019 CLINICAL DATA:  Left pneumothorax. EXAM: PORTABLE CHEST 1 VIEW COMPARISON:  Same day. FINDINGS: The heart size and mediastinal contours are within normal limits. Left-sided chest tube is unchanged in position. No pneumothorax pleural effusion is noted. Right lung is clear. Two rounded densities remain in the left lower lobe which may represent residual inflammation, but nodules cannot be excluded. The visualized skeletal structures are unremarkable. IMPRESSION: Stable position of left-sided chest tube without definite pneumothorax. 2 rounded densities are noted in the left lower lobe which may represent inflammation, but nodules cannot be excluded. CT scan may be performed for further evaluation. Electronically Signed   By: Marijo Conception M.D.   On: 03/09/2019 13:32   DG Chest Port 1 View  Result Date: 03/09/2019 CLINICAL DATA:  Left pneumothorax EXAM: PORTABLE CHEST 1 VIEW COMPARISON:  03/08/2019 FINDINGS: No significant change in AP portable examination. Left-sided chest tube remains in position with no significant residual pneumothorax. No significant change in left basilar airspace opacity. The right lung is normally aerated. Heart and mediastinum are unremarkable. IMPRESSION: No significant change in AP portable examination. Left-sided chest tube remains in position with no significant residual pneumothorax. No significant change in left basilar airspace opacity. Electronically Signed   By: Eddie Candle M.D.   On: 03/09/2019 08:51   DG Chest Portable 1 View  Result Date: 03/07/2019 CLINICAL DATA:  Post chest tube adjustment EXAM: PORTABLE CHEST 1 VIEW COMPARISON:  Radiograph same day 10:24 p.m. FINDINGS: The heart size and mediastinal contours are within normal limits. There is been interval adjustment of the left-sided chest tube which is projecting over the left lung apex. There has been interval  re-expansion of the left lung. Only a tiny residual pneumothorax is remaining. The right lung is clear. No acute osseous abnormality. IMPRESSION: Interval adjustment of left-sided chest tube projecting over the lung apex. Residual tiny left-sided pneumothorax. Electronically Signed   By: Prudencio Pair M.D.   On: 03/07/2019 23:11   DG Chest Portable 1 View  Result Date: 03/07/2019 CLINICAL DATA:  Post chest tube insertion EXAM: PORTABLE CHEST 1 VIEW COMPARISON:  Radiograph same day 8:22 p.m. FINDINGS: The heart size and mediastinal contours are within normal limits. There is a large left-sided pneumothorax again noted measuring approximately a 5.1 cm from the overlying pleural surface. There is been interval left-sided chest tube placement which appears to be in the overlying chest wall tissues. The right lung is clear. IMPRESSION: Interval placement of left-sided chest tube which appears to be in the overlying soft tissues of the left chest wall. Large left pneumothorax These results were called by telephone at the time of interpretation on 03/07/2019 at 10:43 pm to provider [facility] , who verbally acknowledged these results. Electronically Signed   By: Prudencio Pair M.D.   On: 03/07/2019 22:43   ASSESSMENT AND PLAN:   Victor Cortez  is a 69 y.o. male with a known history of COPD, coronary artery disease, atrial fibrillation, GERD, and dyslipidemia, presented to the emergency room with acute onset of dyspnea that woke him up at 3 AM last night.  He admits to associated  chest pain that he graded 6-7/10 in severity with associated palpitations.  1. Large left pneumothorax that could be related to spontaneous rupture emphysematous bleb with COPD, status post left chest tube placement with radiographic improvement.   -Chest tube was placed by ER MD -Dr Genevive Bi to follow pt -serial cxr's -prn pain meds -wean oxygen as able to  2.  COPD.  - He does not seem to be in acute exacerbation.   -prn   Combivent. -Ex-smoker  3.  Dyslipidemia.   - continue statin therapy.  4.  Paroxysmal atrial fibrillation.  -on  sotalol and Xarelto -HR stable  5.  GERD. -on PPI therapy.  6.  DVT prophylaxis.  Continue Xarelto  Procedures:left side CT tube Family communication :patient in the room Consults :cardiothoracic surgery Dr Genevive Bi Discharge Disposition :home CODE STATUS: FULL DVT Prophylaxis :Xarelto Barriers to discharge: awaiting resolution of PTX  TOTAL TIME TAKING CARE OF THIS PATIENT: 25 minutes.  >50% time spent on counselling and coordination of care  Note: This dictation was prepared with Dragon dictation along with smaller phrase technology. Any transcriptional errors that result from this process are unintentional.  Val Riles M.D    [facility]   CC: Primary care physician; Ria Bush, MDPatient ID: Victor Cortez, male   DOB: [birth year], 69 y.o.   MRN: [id number]

## 2019-03-10 ENCOUNTER — Inpatient Hospital Stay: Payer: Medicare Other

## 2019-03-10 LAB — BASIC METABOLIC PANEL
Anion gap: 7 (ref 5–15)
BUN: 23 mg/dL (ref 8–23)
CO2: 25 mmol/L (ref 22–32)
Calcium: 8.7 mg/dL — ABNORMAL LOW (ref 8.9–10.3)
Chloride: 107 mmol/L (ref 98–111)
Creatinine, Ser: 1.19 mg/dL (ref 0.61–1.24)
GFR calc Af Amer: >60 mL/min (ref >60)
GFR calc non Af Amer: >60 mL/min (ref >60)
Glucose, Bld: 106 mg/dL — ABNORMAL HIGH (ref 70–99)
Potassium: 4.1 mmol/L (ref 3.5–5.1)
Sodium: 139 mmol/L (ref 135–145)

## 2019-03-10 LAB — CBC
HCT: 34 % — ABNORMAL LOW (ref 39.0–52.0)
Hemoglobin: 11.3 g/dL — ABNORMAL LOW (ref 13.0–17.0)
MCH: 29 pg (ref 26.0–34.0)
MCHC: 33.2 g/dL (ref 30.0–36.0)
MCV: 87.4 fL (ref 80.0–100.0)
Platelets: 229 10*3/uL (ref 150–400)
RBC: 3.89 MIL/uL — ABNORMAL LOW (ref 4.22–5.81)
RDW: 13.5 % (ref 11.5–15.5)
WBC: 7.2 10*3/uL (ref 4.0–10.5)
nRBC: 0 % (ref 0.0–0.2)

## 2019-03-10 LAB — MAGNESIUM: Magnesium: 1.8 mg/dL (ref 1.7–2.4)

## 2019-03-10 LAB — PHOSPHORUS: Phosphorus: 3 mg/dL (ref 2.5–4.6)

## 2019-03-10 NOTE — Care Management Important Message (Signed)
Important Message  Patient Details  Name: Victor Cortez MRN: [id number] Date of Birth: 06-27-1950   Medicare Important Message Given:  Yes     Dannette Barbara 03/10/2019, 1:13 PM

## 2019-03-10 NOTE — Discharge Summary (Signed)
[facility] Discharge Summary   Patient: Victor Cortez [id number]  PCP: Ria Bush, MD  Date of admission: 03/07/2019   Date of discharge:  03/10/2019     Discharge Diagnoses:  Principal diagnosis Left-sided pneumothorax  Active Problems:   Pneumothorax on left   Chronic atrial fibrillation ([place])   Admitted From: Home Disposition:  Home   Recommendations for Outpatient Follow-up:  PCP in 1 wk, for f/u CT chest (Stable clustered nodular opacities in the lower left lung, indeterminate for neoplasm versus infection. Recommend either short-term follow-up chest radiographs or further evaluation with)  1. chest CT. 2. F/u CT Sx as below  Follow-up Information    Nestor Lewandowsky, MD. Go on 03/18/2019.   Specialties: Cardiothoracic Surgery, General Surgery Why: 1 week follow up, left pneumothorax, needs CXR prior to appointment - 9:30 Contact information: [address] [place] [address] [place] [address] [phone]          Diet recommendation: Cardiac diet  Activity: The patient is advised to gradually reintroduce usual activities, as tolerated  Discharge Condition: stable  Code Status: Full code   History of present illness: As per the H and P dictated on admission, BruceWhitleyis a69 y.o.malewith a known history of COPD, coronary artery disease, atrial fibrillation, GERD, and dyslipidemia, presented to the emergency room with acute onset of dyspnea that woke him up at 3 AM last night. He admits to associated chest pain that he graded 6-7/10 in severity with associated palpitations.  Hospital Course:  Assessment and plan  # Large left pneumothorax that could be related to spontaneous rupture emphysematous bleb with COPD, status post left chest tube placement with radiographic improvement.Chest tube was placed by ER MD, general surgery Dr. Elissa Hefty saw the patient and chest tube was removed on 03/09/2019, patient tolerated procedure well, remained  asymptomatic.  Denies any shortness of breath.  Repeat chest x-ray today shows resolution of pneumothorax.  Patient was cleared by general surgery for discharge home and follow-up after 1 week to repeat chest x-ray as well. #COPD. -He does not seem to be in acute exacerbation. -prn  Combivent. -Ex-smoker, #Dyslipidemia.continue statin therapy. #Paroxysmal atrial fibrillation.-on  sotalol and Xarelto. -HR stable #GERD.-on PPI therapy. Body mass index is 24.91 kg/m.  Nutrition Interventions:  No Pain control controlled substance was prescribed. - [place] Controlled Substance Reporting System database was not reviewed. - Patient was instructed, not to drive, operate heavy machinery, perform activities at heights, swimming or participation in water activities or provide baby sitting services while on Pain, Sleep and Anxiety Medications; until his outpatient Physician has advised to do so again.  - Also recommended to not to take more than prescribed Pain, Sleep and Anxiety Medications.  Patient was ambulatory without any assistance. Patient was not seen by physical therapy because patient is ambulatory and no needs for PT and OT eval.  On the day of the discharge the patient's vitals were stable, and no other acute medical condition were reported by patient. the patient was felt safe to be discharge at Home with no therapy needed on discharge.  Consultants: General surgery Dr. Edison Simon Procedures: Left-sided chest tube insertion due to pneumothorax removed on 03/09/2019.  Discharge Exam: General: Appear in no distress, no Rash; Oral Mucosa Clear, moist. Cardiovascular: S1 and S2 Present, no Murmur, Respiratory: normal respiratory effort, Bilateral Air entry present and no Crackles, no wheezes Abdomen: Bowel Sound present, Soft and no tenderness, no hernia Extremities: no Pedal edema, no calf tenderness Neurology: alert and  oriented to time, place, and person affect  appropriate.  Filed Weights   03/07/19 2017 03/08/19 0121  Weight: 72.6 kg 70 kg   Vitals:   03/10/19 0827 03/10/19 1243  BP: 115/66 123/63  Pulse: 69 65  Resp: 16 18  Temp:  97.8 F (36.6 C)  SpO2: 96% 96%    DISCHARGE MEDICATION: Allergies as of 03/10/2019   No Known Allergies     Medication List    STOP taking these medications   pravastatin 80 MG tablet Commonly known as: PRAVACHOL     TAKE these medications   aspirin EC 81 MG tablet Take 81 mg by mouth daily. 1 tab by mouth in AM   Azopt 1 % ophthalmic suspension Generic drug: brinzolamide Place 1 drop into both eyes 2 (two) times daily.   calcium carbonate 500 MG chewable tablet Commonly known as: Tums Chew 1 tablet (200 mg of elemental calcium total) by mouth daily as needed for indigestion or heartburn.   Combivent Respimat 20-100 MCG/ACT Aers respimat Generic drug: Ipratropium-Albuterol Inhale 1 puff into the lungs every 6 (six) hours as needed for wheezing or shortness of breath.   Fish Oil 1000 MG Caps Take 1 capsule by mouth daily. 1 cap by mouth daily   Glucosamine 1500 Complex Caps Take 1 capsule by mouth daily.   latanoprost 0.005 % ophthalmic solution Commonly known as: XALATAN Place 1 drop into both eyes at bedtime.   losartan 100 MG tablet Commonly known as: COZAAR Take 100 mg by mouth daily.   Multi-Vitamins Tabs Take by mouth. 1 tab by mouth daily   omeprazole 40 MG capsule Commonly known as: PRILOSEC Take 1 capsule (40 mg total) by mouth daily. For 3 weeks then as needed   RA Vitamin B-12 TR 1000 MCG Tbcr Generic drug: Cyanocobalamin Take 1 tablet by mouth daily. One tab by mouth daily   rivaroxaban 20 MG Tabs tablet Commonly known as: XARELTO Take 20 mg by mouth daily.   rosuvastatin 40 MG tablet Commonly known as: CRESTOR Take 40 mg by mouth daily.   sotalol 80 MG tablet Commonly known as: BETAPACE Take 80 mg by mouth 2 (two) times daily. One tab by mouth twice a  day   tadalafil 20 MG tablet Commonly known as: CIALIS Take 1 tablet (20 mg total) by mouth daily as needed for erectile dysfunction.      No Known Allergies Discharge Instructions    Diet - low sodium heart healthy   Complete by: As directed    Discharge instructions   Complete by: As directed    Follow-up with PCP in 1 week, repeat chest x-ray after 1 week and follow-up with CT surgery Patient will need follow-up CT scan chest for pulmonary nodules and may benefit from referral to pulmonologist as an outpatient. Discontinued pravastatin as patient was taking Crestor I do not know why he was on dual statin?,  Recommend to follow with PCP.   Increase activity slowly   Complete by: As directed       The results of significant diagnostics from this hospitalization (including imaging, microbiology, ancillary and laboratory) are listed below for reference.    Significant Diagnostic Studies: DG Chest 1 View  Result Date: 03/08/2019 CLINICAL DATA:  Chest tube EXAM: CHEST  1 VIEW COMPARISON:  03/07/2019. FINDINGS: Left chest tube remains in place. No pneumothorax. Mild left lower lobe airspace disease has improved in the interval. Small left effusion has improved. Right lung remains clear. IMPRESSION: Left  chest tube in place without pneumothorax Improved aeration left lung base. Electronically Signed   By: Franchot Gallo M.D.   On: 03/08/2019 16:46   DG Chest 2 View  Result Date: 03/10/2019 CLINICAL DATA:  Follow-up left pneumothorax after chest tube removal EXAM: CHEST - 2 VIEW COMPARISON:  Chest radiograph from one day prior. FINDINGS: Stable cardiomediastinal silhouette with normal heart size. No pneumothorax. No pleural effusion. Persistent clustered nodular opacities in the lower left lung are unchanged. No pulmonary edema. IMPRESSION: 1. No pneumothorax. 2. Stable clustered nodular opacities in the lower left lung, indeterminate for neoplasm versus infection. Recommend either short-term  follow-up chest radiographs or further evaluation with chest CT. Electronically Signed   By: Ilona Sorrel M.D.   On: 03/10/2019 09:23   DG Chest 2 View  Result Date: 03/07/2019 CLINICAL DATA:  69 year old male with shortness of breath. History of COPD. EXAM: CHEST - 2 VIEW COMPARISON:  Chest radiograph dated 06/04/2013. FINDINGS: There is a large left pneumothorax (greater than 20%) measuring approximately 5.5 cm from the lateral pleural surface. Left lung base atelectasis. There is a background of emphysema. The right lung is clear. No pleural effusion. No mediastinal shift. Atherosclerotic calcification of the aortic arch. No acute osseous pathology. IMPRESSION: Large left pneumothorax.  No mediastinal shift. These results were called by telephone at the time of interpretation on 03/07/2019 at 8:40 pm to provider [facility] , who verbally acknowledged these results. Electronically Signed   By: Anner Crete M.D.   On: 03/07/2019 20:45   DG Chest Port 1 View  Result Date: 03/09/2019 CLINICAL DATA:  Left pneumothorax. EXAM: PORTABLE CHEST 1 VIEW COMPARISON:  Same day. FINDINGS: The heart size and mediastinal contours are within normal limits. Left-sided chest tube is unchanged in position. No pneumothorax pleural effusion is noted. Right lung is clear. Two rounded densities remain in the left lower lobe which may represent residual inflammation, but nodules cannot be excluded. The visualized skeletal structures are unremarkable. IMPRESSION: Stable position of left-sided chest tube without definite pneumothorax. 2 rounded densities are noted in the left lower lobe which may represent inflammation, but nodules cannot be excluded. CT scan may be performed for further evaluation. Electronically Signed   By: Marijo Conception M.D.   On: 03/09/2019 13:32   DG Chest Port 1 View  Result Date: 03/09/2019 CLINICAL DATA:  Left pneumothorax EXAM: PORTABLE CHEST 1 VIEW COMPARISON:  03/08/2019 FINDINGS: No  significant change in AP portable examination. Left-sided chest tube remains in position with no significant residual pneumothorax. No significant change in left basilar airspace opacity. The right lung is normally aerated. Heart and mediastinum are unremarkable. IMPRESSION: No significant change in AP portable examination. Left-sided chest tube remains in position with no significant residual pneumothorax. No significant change in left basilar airspace opacity. Electronically Signed   By: Eddie Candle M.D.   On: 03/09/2019 08:51   DG Chest Portable 1 View  Result Date: 03/07/2019 CLINICAL DATA:  Post chest tube adjustment EXAM: PORTABLE CHEST 1 VIEW COMPARISON:  Radiograph same day 10:24 p.m. FINDINGS: The heart size and mediastinal contours are within normal limits. There is been interval adjustment of the left-sided chest tube which is projecting over the left lung apex. There has been interval re-expansion of the left lung. Only a tiny residual pneumothorax is remaining. The right lung is clear. No acute osseous abnormality. IMPRESSION: Interval adjustment of left-sided chest tube projecting over the lung apex. Residual tiny left-sided pneumothorax. Electronically Signed  By: Prudencio Pair M.D.   On: 03/07/2019 23:11   DG Chest Portable 1 View  Result Date: 03/07/2019 CLINICAL DATA:  Post chest tube insertion EXAM: PORTABLE CHEST 1 VIEW COMPARISON:  Radiograph same day 8:22 p.m. FINDINGS: The heart size and mediastinal contours are within normal limits. There is a large left-sided pneumothorax again noted measuring approximately a 5.1 cm from the overlying pleural surface. There is been interval left-sided chest tube placement which appears to be in the overlying chest wall tissues. The right lung is clear. IMPRESSION: Interval placement of left-sided chest tube which appears to be in the overlying soft tissues of the left chest wall. Large left pneumothorax These results were called by telephone at the  time of interpretation on 03/07/2019 at 10:43 pm to provider [facility] , who verbally acknowledged these results. Electronically Signed   By: Prudencio Pair M.D.   On: 03/07/2019 22:43    Microbiology: Recent Results (from the past 240 hour(s))  SARS CORONAVIRUS 2 (TAT 6-24 HRS) Nasopharyngeal Nasopharyngeal Swab     Status: None   Collection Time: 03/07/19 10:08 PM   Specimen: Nasopharyngeal Swab  Result Value Ref Range Status   SARS Coronavirus 2 NEGATIVE NEGATIVE Final    Comment: (NOTE) SARS-CoV-2 target nucleic acids are NOT DETECTED. The SARS-CoV-2 RNA is generally detectable in upper and lower respiratory specimens during the acute phase of infection. Negative results do not preclude SARS-CoV-2 infection, do not rule out co-infections with other pathogens, and should not be used as the sole basis for treatment or other patient management decisions. Negative results must be combined with clinical observations, patient history, and epidemiological information. The expected result is Negative. Fact Sheet for Patients: SugarRoll.be Fact Sheet for Healthcare Providers: https://www.woods-mathews.com/ This test is not yet approved or cleared by the [place] FDA and  has been authorized for detection and/or diagnosis of SARS-CoV-2 by FDA under an Emergency Use Authorization (EUA). This EUA will remain  in effect (meaning this test can be used) for the duration of the COVID-19 declaration under Section 56 4(b)(1) of the Act, 21 U.S.C. section 360bbb-3(b)(1), unless the authorization is terminated or revoked sooner. Performed at [facility], [place] [address], [place], [place] [address]      Labs: CBC: Recent Labs  Lab 03/07/19 2024 03/08/19 0447 03/10/19 0628  WBC 9.9 8.7 7.2  HGB 13.2 11.8* 11.3*  HCT 40.6 35.6* 34.0*  MCV 87.7 87.9 87.4  PLT 287 223 [id number]   Basic Metabolic Panel: Recent Labs  Lab 03/07/19 2024  03/08/19 0447 03/10/19 0628  NA 140 142 139  K 4.3 4.3 4.1  CL 108 109 107  CO2 23 26 25   GLUCOSE 105* 103* 106*  BUN 19 19 23   CREATININE 1.33* 1.16 1.19  CALCIUM 9.1 8.8* 8.7*  MG  --   --  1.8  PHOS  --   --  3.0   Liver Function Tests: No results for input(s): AST, ALT, ALKPHOS, BILITOT, PROT, ALBUMIN in the last 168 hours. No results for input(s): LIPASE, AMYLASE in the last 168 hours. No results for input(s): AMMONIA in the last 168 hours. Cardiac Enzymes: No results for input(s): CKTOTAL, CKMB, CKMBINDEX, TROPONINI in the last 168 hours. BNP (last 3 results) No results for input(s): BNP in the last 8760 hours. CBG: No results for input(s): GLUCAP in the last 168 hours.  Time spent: 35 minutes  Signed:  Val Riles  [facility]  03/10/2019 1:59 PM

## 2019-03-10 NOTE — Progress Notes (Signed)
Victor Cortez  A and O x 4. VSS. Pt tolerating diet well. No complaints of pain or nausea. IV removed intact, no new prescriptions given. Pt voiced understanding of discharge instructions with no further questions. Pt discharged with RN.  Allergies as of 03/10/2019   No Known Allergies     Medication List    STOP taking these medications   pravastatin 80 MG tablet Commonly known as: PRAVACHOL     TAKE these medications   aspirin EC 81 MG tablet Take 81 mg by mouth daily. 1 tab by mouth in AM   Azopt 1 % ophthalmic suspension Generic drug: brinzolamide Place 1 drop into both eyes 2 (two) times daily.   calcium carbonate 500 MG chewable tablet Commonly known as: Tums Chew 1 tablet (200 mg of elemental calcium total) by mouth daily as needed for indigestion or heartburn.   Combivent Respimat 20-100 MCG/ACT Aers respimat Generic drug: Ipratropium-Albuterol Inhale 1 puff into the lungs every 6 (six) hours as needed for wheezing or shortness of breath.   Fish Oil 1000 MG Caps Take 1 capsule by mouth daily. 1 cap by mouth daily   Glucosamine 1500 Complex Caps Take 1 capsule by mouth daily.   latanoprost 0.005 % ophthalmic solution Commonly known as: XALATAN Place 1 drop into both eyes at bedtime.   losartan 100 MG tablet Commonly known as: COZAAR Take 100 mg by mouth daily.   Multi-Vitamins Tabs Take by mouth. 1 tab by mouth daily   omeprazole 40 MG capsule Commonly known as: PRILOSEC Take 1 capsule (40 mg total) by mouth daily. For 3 weeks then as needed   RA Vitamin B-12 TR 1000 MCG Tbcr Generic drug: Cyanocobalamin Take 1 tablet by mouth daily. One tab by mouth daily   rivaroxaban 20 MG Tabs tablet Commonly known as: XARELTO Take 20 mg by mouth daily.   rosuvastatin 40 MG tablet Commonly known as: CRESTOR Take 40 mg by mouth daily.   sotalol 80 MG tablet Commonly known as: BETAPACE Take 80 mg by mouth 2 (two) times daily. One tab by mouth twice a day    tadalafil 20 MG tablet Commonly known as: CIALIS Take 1 tablet (20 mg total) by mouth daily as needed for erectile dysfunction.       Vitals:   03/10/19 0827 03/10/19 1243  BP: 115/66 123/63  Pulse: 69 65  Resp: 16 18  Temp:  97.8 F (36.6 C)  SpO2: 96% 96%    Francesco Sor

## 2019-03-11 ENCOUNTER — Telehealth: Payer: Self-pay

## 2019-03-11 ENCOUNTER — Other Ambulatory Visit: Payer: Self-pay

## 2019-03-11 ENCOUNTER — Ambulatory Visit (INDEPENDENT_AMBULATORY_CARE_PROVIDER_SITE_OTHER): Payer: Medicare Other | Admitting: Family Medicine

## 2019-03-11 ENCOUNTER — Encounter: Payer: Self-pay | Admitting: Family Medicine

## 2019-03-11 VITALS — BP 120/62 | HR 65 | Temp 97.8°F | Ht 66.0 in | Wt 160.2 lb

## 2019-03-11 DIAGNOSIS — J181 Lobar pneumonia, unspecified organism: Secondary | ICD-10-CM | POA: Diagnosis not present

## 2019-03-11 DIAGNOSIS — J449 Chronic obstructive pulmonary disease, unspecified: Secondary | ICD-10-CM

## 2019-03-11 DIAGNOSIS — Z8701 Personal history of pneumonia (recurrent): Secondary | ICD-10-CM | POA: Diagnosis not present

## 2019-03-11 DIAGNOSIS — Z87891 Personal history of nicotine dependence: Secondary | ICD-10-CM | POA: Diagnosis not present

## 2019-03-11 DIAGNOSIS — I708 Atherosclerosis of other arteries: Secondary | ICD-10-CM

## 2019-03-11 DIAGNOSIS — I700 Atherosclerosis of aorta: Secondary | ICD-10-CM | POA: Diagnosis not present

## 2019-03-11 DIAGNOSIS — J939 Pneumothorax, unspecified: Secondary | ICD-10-CM

## 2019-03-11 NOTE — Assessment & Plan Note (Addendum)
Reviewed recent imaging compared to imaging from last summer - anticipate abnormality seen this week corresponds to residual scarring from prior nodule that had resolved on PET scan (was not hypermetabolic at that time). Will await CXR and CT surgeon eval next week.

## 2019-03-11 NOTE — Telephone Encounter (Signed)
Patient agreed to come in today at 3:30 pm. Advised patient to check in around 3:15 pm. Appointment added to schedule.

## 2019-03-11 NOTE — Telephone Encounter (Signed)
Transition Care Management Follow-up Telephone Call  Date of discharge and from where: 03/10/2019, [facility]  How have you been since you were released from the hospital? Patient states that he is feeling much better. No symptoms noted.   Any questions or concerns? No   Items Reviewed:  Did the pt receive and understand the discharge instructions provided? Yes   Medications obtained and verified? Yes   Any new allergies since your discharge? No   Dietary orders reviewed? Yes  Do you have support at home? Yes   Functional Questionnaire: (I = Independent and D = Dependent) ADLs: I  Bathing/Dressing- I  Meal Prep- I  Eating- I  Maintaining continence- I  Transferring/Ambulation- I  Managing Meds- I  Follow up appointments reviewed:   PCP Hospital f/u appt confirmed? No Message forwarded to Dr. Danise Mina notifying him that patient states he needs to see him before his appointment with Dr. Nestor Lewandowsky on 03/18/2019. He needs a chest xray completed before that visit and can't come in until after 3:30 pm because he is returning to work on Monday. Provider has no availability on his schedule next week during this time.   [facility] f/u appt confirmed? Yes  Scheduled to see Dr. Nestor Lewandowsky on 03/18/19.  Are transportation arrangements needed? No   If their condition worsens, is the pt aware to call PCP or go to the Emergency Dept.? Yes  Was the patient provided with contact information for the PCP's office or ED? Yes  Was to pt encouraged to call back with questions or concerns? Yes

## 2019-03-11 NOTE — Telephone Encounter (Signed)
See other note. Can he come in today at 3:30pm? (I have opening at that time)

## 2019-03-11 NOTE — Progress Notes (Signed)
This visit was conducted in person.  BP 120/62 (BP Location: Right Arm, Patient Position: Sitting, Cuff Size: Normal)   Pulse 65   Temp 97.8 F (36.6 C) (Temporal)   Ht 5\' 6"  (1.676 m)   Wt 160 lb 4 oz (72.7 kg)   SpO2 100%   BMI 25.87 kg/m    CC: hosp f/u visit Subjective:    Patient ID: Norman Clay, male    DOB: [birth year], 69 y.o.   MRN: [id number]  HPI: IORI CARACAPPA is a 69 y.o. male presenting on 03/11/2019 for Hospitalization Follow-up   Recent hospitalization for large L sided PTX thought related to spontaneous rupture of emphysematous bleb from COPD s/p L chest tube placement. Planned f/u with CT surgery next week.   Incidental finding of stable clustered nodular opacities in LLL, indeterminate in cause.   Reviewing chart, he had CT chest 09/2018 showing new 11.48mm nodular opacity central LLL rec 3 mo f/u. He actually had PET scan 09/2018 showing complete resolution of LLL nodule with only minimal residual scarring, without metabolic activity.   Endorses ongoing productive cough, with some blood tinged sputum.   Date of admission: 03/07/2019 Date of discharge: 03/10/2019 TCM hosp f/u phone call completed: 03/11/2019 Will defer TCM today - and have him f/u with CT surgery for TCM visit.   Discharge Diagnoses:  Principal diagnosis Left-sided pneumothorax  Active Problems:   Pneumothorax on left   Chronic atrial fibrillation ([place])  Admitted From: Home Disposition:  Home   Recommendations for Outpatient Follow-up:  1. PCP in 1 wk, for f/u CT chest (Stable clustered nodular opacities in the lower left lung, indeterminate for neoplasm versus infection. Recommend either short-term follow-up chest radiographs or further evaluation with chest CT.) 2. F/u CT Sx as below  Diet recommendation: Cardiac diet  Activity: The patient is advised to gradually reintroduce usual activities, as tolerated  Discharge Condition: stable  Code Status: Full code       Relevant past medical, surgical, family and social history reviewed and updated as indicated. Interim medical history since our last visit reviewed. Allergies and medications reviewed and updated. Outpatient Medications Prior to Visit  Medication Sig Dispense Refill  . aspirin EC 81 MG tablet Take 81 mg by mouth daily. 1 tab by mouth in AM    . AZOPT 1 % ophthalmic suspension Place 1 drop into both eyes 2 (two) times daily.     . calcium carbonate (TUMS) 500 MG chewable tablet Chew 1 tablet (200 mg of elemental calcium total) by mouth daily as needed for indigestion or heartburn.    . Cyanocobalamin (RA VITAMIN B-12 TR) 1000 MCG TBCR Take 1 tablet by mouth daily. One tab by mouth daily    . Glucosamine-Chondroit-Vit C-Mn (GLUCOSAMINE 1500 COMPLEX) CAPS Take 1 capsule by mouth daily.    . Ipratropium-Albuterol (COMBIVENT RESPIMAT) 20-100 MCG/ACT AERS respimat Inhale 1 puff into the lungs every 6 (six) hours as needed for wheezing or shortness of breath. 4 g 6  . losartan (COZAAR) 100 MG tablet Take 100 mg by mouth daily.    . Multiple Vitamin (MULTI-VITAMINS) TABS Take by mouth. 1 tab by mouth daily    . Omega-3 Fatty Acids (FISH OIL) 1000 MG CAPS Take 1 capsule by mouth daily. 1 cap by mouth daily    . omeprazole (PRILOSEC) 40 MG capsule Take 1 capsule (40 mg total) by mouth daily. For 3 weeks then as needed 30 capsule 3  . rivaroxaban (XARELTO) 20  MG TABS tablet Take 20 mg by mouth daily.     . rosuvastatin (CRESTOR) 40 MG tablet Take 40 mg by mouth daily.    . sotalol (BETAPACE) 80 MG tablet Take 80 mg by mouth 2 (two) times daily. One tab by mouth twice a day    . tadalafil (ADCIRCA/CIALIS) 20 MG tablet Take 1 tablet (20 mg total) by mouth daily as needed for erectile dysfunction. 10 tablet 11  . latanoprost (XALATAN) 0.005 % ophthalmic solution Place 1 drop into both eyes at bedtime.     No facility-administered medications prior to visit.     Per HPI unless specifically indicated in  ROS section below Review of Systems Objective:    BP 120/62 (BP Location: Right Arm, Patient Position: Sitting, Cuff Size: Normal)   Pulse 65   Temp 97.8 F (36.6 C) (Temporal)   Ht 5\' 6"  (1.676 m)   Wt 160 lb 4 oz (72.7 kg)   SpO2 100%   BMI 25.87 kg/m   Wt Readings from Last 3 Encounters:  03/11/19 160 lb 4 oz (72.7 kg)  03/08/19 154 lb 5.2 oz (70 kg)  02/28/19 164 lb 3 oz (74.5 kg)    Physical Exam Vitals and nursing note reviewed.  Constitutional:      Appearance: Normal appearance.  Cardiovascular:     Rate and Rhythm: Normal rate and regular rhythm.     Pulses: Normal pulses.     Heart sounds: Normal heart sounds. No murmur.  Pulmonary:     Effort: Pulmonary effort is normal. No respiratory distress.     Breath sounds: Examination of the left-lower field reveals decreased breath sounds. Decreased breath sounds present. No wheezing or rales.     Comments: Dressing to L lateral chest wall c/d/i with mild surrounding ecchymosis Neurological:     Mental Status: He is alert.  Psychiatric:        Mood and Affect: Mood normal.        Behavior: Behavior normal.       DG Chest 2 View CLINICAL DATA:  Follow-up left pneumothorax after chest tube removal  EXAM: CHEST - 2 VIEW  COMPARISON:  Chest radiograph from one day prior.  FINDINGS: Stable cardiomediastinal silhouette with normal heart size. No pneumothorax. No pleural effusion. Persistent clustered nodular opacities in the lower left lung are unchanged. No pulmonary edema.  IMPRESSION: 1. No pneumothorax. 2. Stable clustered nodular opacities in the lower left lung, indeterminate for neoplasm versus infection. Recommend either short-term follow-up chest radiographs or further evaluation with chest CT.  Electronically Signed   By: Ilona Sorrel M.D.   On: 03/10/2019 09:23   Assessment & Plan:  This visit occurred during the SARS-CoV-2 public health emergency.  Safety protocols were in place, including  screening questions prior to the visit, additional usage of staff PPE, and extensive cleaning of exam room while observing appropriate contact time as indicated for disinfecting solutions.  Will defer TCM today - and have him f/u with CT surgery for TCM visit.  Problem List Items Addressed This Visit    Pneumothorax, left - Primary    Seems to be recovering well from this. Has f/u with CT surgery next week.  Requests to come into office for CXR instead of going to hospital - will order this for next week to have ready for f/u with CT surg.       Relevant Orders   DG Chest 2 View   Left lower lobe consolidation ([place])  Reviewed recent imaging compared to imaging from last summer - anticipate abnormality seen this week corresponds to residual scarring from prior nodule that had resolved on PET scan (was not hypermetabolic at that time). Will await CXR and CT surgeon eval next week.       History of pneumonia   Ex-smoker   COPD (chronic obstructive pulmonary disease) ([facility])    Increases PTX risk.           No orders of the defined types were placed in this encounter.  Orders Placed This Encounter  Procedures  . DG Chest 2 View    Standing Status:   Future    Standing Expiration Date:   05/08/2020    Order Specific Question:   Reason for Exam (SYMPTOM  OR DIAGNOSIS REQUIRED)    Answer:   f/u L lung PTX    Order Specific Question:   Preferred imaging location?    Answer:   [facility]    Order Specific Question:   Radiology Contrast Protocol - do NOT remove file path    Answer:   [email or web address]    Patient Instructions  Return Tuesday at your convenience for xray.  Keep appointment with Dr Genevive Bi next week.    Follow up plan: No follow-ups on file.  Ria Bush, MD

## 2019-03-11 NOTE — Telephone Encounter (Signed)
Called patient to complete TCM. Patient states he needs to see Dr. Danise Mina before his appointment with Dr. Nestor Lewandowsky on 03/18/2019. He needs a chest xray completed before that visit and he needs an appointment after 3:30 pm because he is returning to work on Monday. Provider has no availability on his schedule next week during this time. Please advise and forward message to appointment staff for further scheduling.

## 2019-03-11 NOTE — Patient Instructions (Addendum)
Return Tuesday at your convenience for xray.  Keep appointment with Dr Genevive Bi next week.

## 2019-03-11 NOTE — Assessment & Plan Note (Signed)
Seems to be recovering well from this. Has f/u with CT surgery next week.  Requests to come into office for CXR instead of going to hospital - will order this for next week to have ready for f/u with CT surg.

## 2019-03-11 NOTE — Assessment & Plan Note (Signed)
Increases PTX risk.

## 2019-03-11 NOTE — Telephone Encounter (Signed)
Would he be able to come in today at 3:30pm for hosp f/u visit?

## 2019-03-15 ENCOUNTER — Ambulatory Visit (INDEPENDENT_AMBULATORY_CARE_PROVIDER_SITE_OTHER)
Admission: RE | Admit: 2019-03-15 | Discharge: 2019-03-15 | Disposition: A | Discharge status: Home / Self Care | Payer: Medicare Other | Source: Ambulatory Visit | Attending: Family Medicine | Admitting: Family Medicine

## 2019-03-15 DIAGNOSIS — J939 Pneumothorax, unspecified: Secondary | ICD-10-CM

## 2019-03-17 ENCOUNTER — Encounter: Payer: Self-pay | Admitting: Radiology

## 2019-03-17 ENCOUNTER — Ambulatory Visit
Admission: RE | Admit: 2019-03-17 | Discharge: 2019-03-17 | Disposition: A | Discharge status: Home / Self Care | Payer: Medicare Other | Source: Ambulatory Visit | Attending: Family Medicine | Admitting: Family Medicine

## 2019-03-17 ENCOUNTER — Other Ambulatory Visit: Payer: Self-pay

## 2019-03-17 ENCOUNTER — Other Ambulatory Visit: Payer: Self-pay | Admitting: Family Medicine

## 2019-03-17 DIAGNOSIS — R911 Solitary pulmonary nodule: Secondary | ICD-10-CM | POA: Insufficient documentation

## 2019-03-17 DIAGNOSIS — R918 Other nonspecific abnormal finding of lung field: Secondary | ICD-10-CM | POA: Diagnosis not present

## 2019-03-17 MED ORDER — IOHEXOL 300 MG/ML  SOLN
75.0000 mL | Freq: Once | INTRAMUSCULAR | Status: AC | PRN
  Administered 2019-03-17: 12:00:00 75 mL via INTRAVENOUS

## 2019-03-18 ENCOUNTER — Other Ambulatory Visit: Payer: Self-pay | Admitting: *Deleted

## 2019-03-18 ENCOUNTER — Ambulatory Visit: Payer: Medicare Other | Admitting: Cardiothoracic Surgery

## 2019-03-18 ENCOUNTER — Encounter: Payer: Self-pay | Admitting: Cardiothoracic Surgery

## 2019-03-18 ENCOUNTER — Other Ambulatory Visit: Payer: Self-pay

## 2019-03-18 DIAGNOSIS — R911 Solitary pulmonary nodule: Secondary | ICD-10-CM

## 2019-03-18 NOTE — Patient Instructions (Addendum)
Will be seen by Cardiology on Monday. We will call to get you scheduled. Stop your Xarelto.  [facility] [address], [place],  [id number] [phone] Appt: Tuesday February 16th at 2:30 pm.  Report to the emergency room if you start spitting up large amounts of blood.   Follow up here in 2 weeks.

## 2019-03-18 NOTE — Progress Notes (Unsigned)
  Patient ID: Victor Cortez, male   DOB: Apr 18, 1950, 69 y.o.   MRN: [id number]  HISTORY: He returns today in follow-up.  He did have a repeat chest x-ray and a CT scan.  He was admitted to the hospital back in early February with a spontaneous left-sided pneumothorax.  A chest tube was inserted and post procedure we did manage the chest tube which was ultimately removed.  He came back today for further follow-up.  The chest x-ray he had made earlier this week shows an enlarging left lower lobe mass.  The CT scan suggested that this may be a pseudoaneurysm.  In addition there is some new left lower lobe spiculated nodules which given the recent negative PET scan in August suggest that these are either inflammatory or infectious.  Of note is that the patient is on Eliquis and aspirin for a history of atrial fibrillation.   Vitals:   03/18/19 0927  BP: (!) 160/103  Pulse: 74  Resp: 12  Temp: 98.6 F (37 C)  SpO2: 98%     EXAM:    Resp: Lungs are clear bilaterally.  No respiratory distress, normal effort. Heart:  Regular without murmurs Abd:  Abdomen is soft, non distended and non tender. No masses are palpable.  There is no rebound and no guarding.  Neurological: Alert and oriented to person, place, and time. Coordination normal.  Skin: Skin is warm and dry. No rash noted. No diaphoretic. No erythema. No pallor. There is a moderate amount of ecchymotic areas on the chest tube insertion site Psychiatric: Normal mood and affect. Normal behavior. Judgment and thought content normal.    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  I am waiting to hear back as to how best to proceed with this.  At the present time I am unavailable for surgical intervention and would appreciate their input.    Nestor Lewandowsky, MD

## 2019-03-21 ENCOUNTER — Other Ambulatory Visit: Payer: Self-pay

## 2019-03-21 ENCOUNTER — Ambulatory Visit
Admission: RE | Admit: 2019-03-21 | Discharge: 2019-03-21 | Disposition: A | Discharge status: Home / Self Care | Payer: Medicare Other | Source: Ambulatory Visit | Attending: Cardiothoracic Surgery | Admitting: Cardiothoracic Surgery

## 2019-03-21 DIAGNOSIS — J90 Pleural effusion, not elsewhere classified: Secondary | ICD-10-CM | POA: Diagnosis not present

## 2019-03-21 DIAGNOSIS — R911 Solitary pulmonary nodule: Secondary | ICD-10-CM

## 2019-03-21 MED ORDER — IOPAMIDOL (ISOVUE-370) INJECTION 76%
75.0000 mL | Freq: Once | INTRAVENOUS | Status: AC | PRN
  Administered 2019-03-21: 75 mL via INTRAVENOUS

## 2019-03-22 ENCOUNTER — Other Ambulatory Visit: Payer: Self-pay | Admitting: Cardiothoracic Surgery

## 2019-03-22 ENCOUNTER — Institutional Professional Consult (permissible substitution) (INDEPENDENT_AMBULATORY_CARE_PROVIDER_SITE_OTHER): Payer: Medicare Other | Admitting: Cardiothoracic Surgery

## 2019-03-22 VITALS — BP 180/79 | HR 80 | Temp 98.1°F | Resp 20 | Ht 66.0 in | Wt 162.0 lb

## 2019-03-22 DIAGNOSIS — I700 Atherosclerosis of aorta: Secondary | ICD-10-CM | POA: Diagnosis not present

## 2019-03-22 DIAGNOSIS — R911 Solitary pulmonary nodule: Secondary | ICD-10-CM

## 2019-03-22 DIAGNOSIS — I708 Atherosclerosis of other arteries: Secondary | ICD-10-CM | POA: Diagnosis not present

## 2019-03-22 NOTE — Progress Notes (Unsigned)
Ct

## 2019-03-22 NOTE — Progress Notes (Signed)
[address]       [place] [address]             [phone]                    Cheick S He Balfour Medical Record [id number] Date of Birth: 1950-10-31  Referring: Nestor Lewandowsky, MD Primary Care: Ria Bush, MD Primary Cardiologist: No primary care provider on file.  Chief Complaint:    Chief Complaint  Patient presents with  . Lung Lesion    Surgical eval, Chest CT 03/17/19, CTA Chest 03/21/19, PET Scan 10/04/18    History of Present Illness:    Victor Cortez 69 y.o. male is seen in the office  today for follow after recent hospitalization at [facility] for PTX.  Patient has a long history of smoking 1-1/2 to 2 packs/day for 50 years, he quit smoking 6 years ago after he was hospitalized for pneumonia.  Since August 2017 he has been enrolled in a lung cancer screening program.  In August 2020 CT scan suggested new left lower lobe lung lesion, however a week later PET scan showed resolution of the lung nodule.  For at least 6 months the patient has had history of minor hemoptysis.  Approximately 2 weeks ago he awoke from sleep with shortness of breath, he went to work but because of persistent left chest discomfort and shortness of breath went to the [facility] emergency room on February 1.  He had a left pneumothorax on chest x-ray, chest tube was placed in the emergency room, from the patient's history and a series of x-rays there was some difficulty in placing this chest tube and it was repositioned.,  Ultimately his lung reexpanded and the chest tube was removed.   A repeat CT scan of the chest was done February 12th.  The patient was seen by Dr. Faith Rogue and referred to our office.  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  After review with interventional radiologist a repeat CT scan was done today specifically with contrast timing to evaluate the pulmonary arteries.  In  addition between the 2 scans the patient discontinued his anticoagulation with Xarelto .  He had been on Xarelto for atrial fibrillation first noted 6 years ago associated with his admission for pneumonia.  The patient is unaware of any palpitations or the duration of his atrial fibrillation, on exam in the office today is does not appear to be in atrial fibrillation   Current Activity/ Functional Status:  Patient is independent with mobility/ambulation, transfers, ADL's, IADL's.   Zubrod Score: At the time of surgery this patient's most appropriate activity status/level should be described as: []     0    Normal activity, no symptoms [x]     1    Restricted in physical strenuous activity but ambulatory, able to do out light work []     2    Ambulatory and capable of self care, unable to do work activities, up and about               >50 % of waking hours                              []     3    Only limited self care, in bed greater than 50% of waking hours []   4    Completely disabled, no self care, confined to bed or chair []     5    Moribund   Past Medical History:  Diagnosis Date  . Aortic atherosclerosis ([place])    by CT  . Arthritis   . Atrial fibrillation with RVR ([place]) 06/2013   during hospitalization  . Basal cell carcinoma of nose 1998  . CAD (coronary artery disease)    by CT  . Cataract   . Cholelithiasis 10/2017   by CT  . COPD (chronic obstructive pulmonary disease) ([place])   . Dysrhythmia   . ED (erectile dysfunction)   . Ex-smoker   . GERD (gastroesophageal reflux disease)   . Glaucoma    Dr. Matilde Sprang  . Glaucoma   . Hepatic steatosis    by CT  . History of chicken pox   . History of pneumonia 06/2013   with sepsis and [facility] hospitalization  . Hyperlipidemia   . Rosacea   . Seborrheic dermatitis     Past Surgical History:  Procedure Laterality Date  . CYSTOSCOPY WITH URETHRAL DILATATION  12/04/2017   Procedure: CYSTOSCOPY WITH URETHRAL DILATATION;  Surgeon:  Billey Co, MD;  Location: [facility] ORS;  Service: Urology;;  . CYSTOSCOPY/URETEROSCOPY/HOLMIUM LASER/STENT PLACEMENT Left 12/04/2017   Procedure: CYSTOSCOPY/URETEROSCOPY/HOLMIUM LASER/STENT PLACEMENT;  Surgeon: Billey Co, MD;  Location: [facility] ORS;  Service: Urology;  Laterality: Left;  . 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 ROTATOR CUFF REPAIR  2016    Family History  Problem Relation Age of Onset  . COPD Father   . Cancer Father        lung (smoker)  . COPD Mother   . CAD Neg Hx   . Stroke Neg Hx   . Bladder Cancer Neg Hx   . Prostate cancer Neg Hx      Social History   Tobacco Use  Smoking Status Former Smoker  . Packs/day: 2.00  . Years: 53.00  . Pack years: 106.00  . Types: Cigarettes  . Quit date: 06/03/2013  . Years since quitting: 5.8  Smokeless Tobacco Never Used    Social History   Substance and Sexual Activity  Alcohol Use No     No Known Allergies  Current Outpatient Medications  Medication Sig Dispense Refill  . AZOPT 1 % ophthalmic suspension Place 1 drop into both eyes 2 (two) times daily.     . calcium carbonate (TUMS) 500 MG chewable tablet Chew 1 tablet (200 mg of elemental calcium total) by mouth daily as needed for indigestion or heartburn.    . Cyanocobalamin (RA VITAMIN B-12 TR) 1000 MCG TBCR Take 1 tablet by mouth daily. One tab by mouth daily    . Glucosamine-Chondroit-Vit C-Mn (GLUCOSAMINE 1500 COMPLEX) CAPS Take 1 capsule by mouth daily.    . Ipratropium-Albuterol (COMBIVENT RESPIMAT) 20-100 MCG/ACT AERS respimat Inhale 1 puff into the lungs every 6 (six) hours as needed for wheezing or shortness of breath. 4 g 6  . losartan (COZAAR) 100 MG tablet Take 100 mg by mouth daily.    . Multiple Vitamin (MULTI-VITAMINS) TABS Take by mouth. 1 tab by mouth daily    . Omega-3 Fatty Acids (FISH OIL) 1000  MG CAPS Take 1 capsule by mouth daily. 1 cap by mouth daily    . omeprazole (PRILOSEC) 40 MG capsule Take 1 capsule (40 mg total) by  mouth daily. For 3 weeks then as needed 30 capsule 3  . rosuvastatin (CRESTOR) 40 MG tablet Take 40 mg by mouth daily.    . sotalol (BETAPACE) 80 MG tablet Take 80 mg by mouth 2 (two) times daily. One tab by mouth twice a day    . tadalafil (ADCIRCA/CIALIS) 20 MG tablet Take 1 tablet (20 mg total) by mouth daily as needed for erectile dysfunction. 10 tablet 11  . aspirin EC 81 MG tablet Take 81 mg by mouth daily. 1 tab by mouth in AM    . rivaroxaban (XARELTO) 20 MG TABS tablet Take 20 mg by mouth daily.      No current facility-administered medications for this visit.      Review of Systems:     Cardiac Review of Systems: [Y] = yes  or   [ N ] = no   Chest Pain [ n   ]  Resting SOB [  n ] Exertional SOB  [ y ]  Vertell Limber Florencio.Farrier  ]   Pedal Edema [n   ]    Palpitations [  n] Syncope  n[  ]   Presyncope [  n ]   General Review of Systems: [Y] = yes [  ]=no Constitional: recent weight change [  ];  Wt loss over the last 3 months [   ] anorexia [  ]; fatigue [  ]; nausea [  ]; night sweats [  ]; fever [  ]; or chills [  ];           Eye : blurred vision [  ]; diplopia [   ]; vision changes [  ];  Amaurosis fugax[  ]; Resp: cough [  ];  wheezing[ y ];  hemoptysis[ y ]; shortness of breath[y  ]; paroxysmal nocturnal dyspnea[  ]; dyspnea on exertion[ y ]; or orthopnea[  ];  GI:  gallstones[  ], vomiting[  ];  dysphagia[  ]; melena[  ];  hematochezia [  ]; heartburn[  ];   Hx of  Colonoscopy[  ]; GU: kidney stones [  ]; hematuria[  ];   dysuria [  ];  nocturia[  ];  history of     obstruction [  ]; urinary frequency [  ]             Skin: rash, swelling[  ];, hair loss[  ];  peripheral edema[  ];  or itching[  ]; Musculosketetal: myalgias[  ];  joint swelling[  ];  joint erythema[  ];  joint pain[  ];  back pain[  ];  Heme/Lymph: bruising[  ];  bleeding[  ];  anemia[  ];   Neuro: TIA[  ];  headaches[  ];  stroke[  ];  vertigo[  ];  seizures[  ];   paresthesias[  ];  difficulty walking[  ];  Psych:depression[  ]; anxiety[  ];  Endocrine: diabetes[  ];  thyroid dysfunction[  ];  Immunizations: Flu up to date [  ]; Pneumococcal up to date [  ];  Other:     PHYSICAL EXAMINATION: BP (!) 180/79   Pulse 80   Temp 98.1 F (36.7 C) (Skin)   Resp 20   Ht 5\' 6"  (1.676 m)   Wt 162 lb (73.5 kg)   SpO2 96% Comment: RA  BMI 26.15 kg/m  General appearance: alert, cooperative, appears stated age and no distress Head: Normocephalic, without obvious abnormality, atraumatic Lymph nodes: Cervical, supraclavicular, and axillary nodes  normal. Resp: clear to auscultation bilaterally Back: symmetric, no curvature. ROM normal. No CVA tenderness. Cardio: regular rate and rhythm, S1, S2 normal, no murmur, click, rub or gallop GI: soft, non-tender; bowel sounds normal; no masses,  no organomegaly Extremities: extremities normal, atraumatic, no cyanosis or edema and Homans sign is negative, no sign of DVT Neurologic: Grossly normal  Diagnostic Studies & Laboratory data:     Recent Radiology Findings:   DG Chest 1 View  Result Date: 03/08/2019 CLINICAL DATA:  Chest tube EXAM: CHEST  1 VIEW COMPARISON:  03/07/2019. FINDINGS: Left chest tube remains in place. No pneumothorax. Mild left lower lobe airspace disease has improved in the interval. Small left effusion has improved. Right lung remains clear. IMPRESSION: Left chest tube in place without pneumothorax Improved aeration left lung base. Electronically Signed   By: Franchot Gallo M.D.   On: 03/08/2019 16:46   DG Chest 2 View  Result Date: 03/16/2019 CLINICAL DATA:  Follow-up left lung pneumothorax EXAM: CHEST - 2 VIEW COMPARISON:  CT September 27, 2018, radiograph 03/10/2019, PET CT October 04, 2018 FINDINGS: No residual or recurrent left pneumothorax. Masslike opacity is again seen in the left lower lobe. Blunting of the left  costophrenic sulcus may reflect small volume pleural fluid or scarring. Remaining portions of the lungs are clear. The cardiomediastinal contours are unremarkable. No acute osseous or soft tissue abnormality. Degenerative changes are present in the imaged spine and shoulders. IMPRESSION: No residual or recurrent left pneumothorax. Increasing size and attenuation of the left lower lobe masslike opacity concerning for infection or neoplasm. Consider further evaluation with cross-sectional imaging as this has increased in size from most recent cross-sectional comparison studies. Scarring versus trace effusion of the left costophrenic sulcus. Electronically Signed   By: Lovena Le M.D.   On: 03/16/2019 00:03   DG Chest 2 View  Result Date: 03/10/2019 CLINICAL DATA:  Follow-up left pneumothorax after chest tube removal EXAM: CHEST - 2 VIEW COMPARISON:  Chest radiograph from one day prior. FINDINGS: Stable cardiomediastinal silhouette with normal heart size. No pneumothorax. No pleural effusion. Persistent clustered nodular opacities in the lower left lung are unchanged. No pulmonary edema. IMPRESSION: 1. No pneumothorax. 2. Stable clustered nodular opacities in the lower left lung, indeterminate for neoplasm versus infection. Recommend either short-term follow-up chest radiographs or further evaluation with chest CT. Electronically Signed   By: Ilona Sorrel M.D.   On: 03/10/2019 09:23   DG Chest 2 View  Result Date: 03/07/2019 CLINICAL DATA:  69 year old male with shortness of breath. History of COPD. EXAM: CHEST - 2 VIEW COMPARISON:  Chest radiograph dated 06/04/2013. FINDINGS: There is a large left pneumothorax (greater than 20%) measuring approximately 5.5 cm from the lateral pleural surface. Left lung base atelectasis. There is a background of emphysema. The right lung is clear. No pleural effusion. No mediastinal shift. Atherosclerotic calcification of the aortic arch. No acute osseous pathology. IMPRESSION:  Large left pneumothorax.  No mediastinal shift. These results were called by telephone at the time of interpretation on 03/07/2019 at 8:40 pm to provider [facility] , who verbally acknowledged these results. Electronically Signed   By: Anner Crete M.D.   On: 03/07/2019 20:45   CT Chest W Contrast  Result Date: 03/17/2019 CLINICAL DATA:  Follow-up for left lower lung mass noted on recent chest radiographs. EXAM: CT CHEST WITH CONTRAST TECHNIQUE: Multidetector CT imaging of the chest was performed during intravenous contrast administration. CONTRAST:  54mL OMNIPAQUE IOHEXOL 300 MG/ML  SOLN  COMPARISON:  Current chest radiograph, 03/15/2019. Prior chest CT, 09/27/2018 FINDINGS: Cardiovascular: Heart normal in size and configuration. No pericardial effusion. Mild three-vessel coronary artery calcifications. None great vessels normal in caliber. Mild aortic atherosclerosis. Mediastinum/Nodes: Prominent left infrahilar lymph nodes, largest 1 cm short axis, new since the prior CT. No mediastinal or right hilar masses or enlarged lymph nodes. Normal thyroid. No neck base or axillary masses or adenopathy. Trachea and esophagus are unremarkable. Lungs/Pleura: Well-defined oval mass in the left lower lobe abutting the oblique fissure, measuring 2.6 x 2.0 x 2.5 cm. This contains a central oval area high attenuation, mean 170 Hounsfield units, consistent with an enhancing vascular structure. There are additional left lower lobe opacities that are more irregular, 1 centered in the left lower lobe, image 104, series 3, measuring 2.1 x 1.5 x 1.7 cm. There are adjacent additional irregular opacities with hazy and reticular opacities along the margins that extend from the left lower lobe adjacent to the oblique fissure, image 83, inferiorly to the posterior lung base. On the prior exam there was a focal opacity in the left lower lobe, which corresponds to the opacity noted on image 104, series 3. This opacity is increased  in size. Remainder of the lungs is clear. Trace left pleural effusion. Small area of nonspecific pleural thickening noted in the left upper hemithorax centered on image 60, series 2. No pneumothorax. Upper Abdomen: No acute findings.  Gallstones. Musculoskeletal: No fracture or acute finding. No osteoblastic or osteolytic lesions. IMPRESSION: 1. Masslike opacity noted on the current chest radiograph appears to be a combination of a well-defined, 2.5 cm left lower lobe opacity and adjacent more irregular focal opacities. The well-defined 2.5 cm opacity contains a central oval area of high attenuation consistent with an enhancing vascular structure. This could reflect a pseudoaneurysm. It is new since the prior CT. There is no defined feeding or draining vessel. 2. Adjacent irregular focal opacities in the left lower lobe are noted, 1 enlarged compared to the prior CT and the others new. Although neoplastic disease is in the differential diagnosis, these findings are suspected to be inflammatory/infectious in etiology. 3. There is mild associated left infrahilar adenopathy. 4. Remainder of the lungs is clear. 5. Mild coronary artery and aortic atherosclerosis. Aortic Atherosclerosis (ICD10-I70.0). Electronically Signed   By: Lajean Manes M.D.   On: 03/17/2019 12:18   CT ANGIO CHEST PE W OR WO CONTRAST  Result Date: 03/21/2019 CLINICAL DATA:  Evaluate for vascular lesion or pseudoaneurysm. History of left pneumothorax and chest tube. EXAM: CT ANGIOGRAPHY CHEST WITH CONTRAST TECHNIQUE: Multidetector CT imaging of the chest was performed using the standard protocol during bolus administration of intravenous contrast. Multiplanar CT image reconstructions and MIPs were obtained to evaluate the vascular anatomy. CONTRAST:  33mL ISOVUE-370 IOPAMIDOL (ISOVUE-370) INJECTION 76% COMPARISON:  03/17/2019 FINDINGS: Cardiovascular: There is contrast within the aorta and the pulmonary arteries on this examination. Normal  appearance of the pulmonary arteries without filling defects or pulmonary emboli. Mild atherosclerotic disease in the distal aortic arch and descending thoracic aorta without aneurysm or dissection. Ascending thoracic aorta measures up to 3.4 cm. Great vessels are patent. Mild atherosclerotic disease in left subclavian artery. Heart size is normal. No significant pericardial fluid. Mediastinum/Nodes: Normal appearance of the mediastinal structures. Stable fullness or lymphadenopathy in left hilum measuring up to 1.1 cm on sequence 4, image 59. No significant axillary lymph node enlargement. Lungs/Pleura: Trachea and mainstem bronchi are patent. Emphysematous changes noted in the upper lobes. No  large pleural effusions. Focal pleural thickening along the left anterolateral chest adjacent to the third rib that is new since 2020 and could be related to prior chest tube.Again noted is a rounded lesion in left lower lobe adjacent to the left major fissure. This lesion no longer contains contrast as a did on the recent examination. Previously, this was concerning for a vascular pseudoaneurysm. Structure now measures 2.1 x 2.4 x 3.6 cm and previously measured 2.4 x 2.4 x 4.1 cm. There continues to be additional small nodular structures in the left lower lobe. Lesion along the posterior left lower lobe on sequence 7, image 108 is a multi lobulated structure measuring 2.2 x 1.1 cm, previously measured 2.1 x 1.7 cm. Lesion is more distinct and has decreased inflammatory changes. Overall, the parenchymal densities and lesions in the left lower lobe have decreased in size. There continues to be dependent densities in left lower lobe which have not significantly changed. No significant pleural fluid. New 4 mm nodule in the left lower lobe on sequence 7, image 88. Upper Abdomen: Multiple calcified gallstones. Possible cyst involving the posterolateral left kidney. Stable lymph node or nodule just medial to the left adrenal gland.  This node measures roughly 1.0 cm in the short axis and measured roughly 0.9 cm on 09/25/2015. Small lymph nodes in the upper abdomen are stable. No acute abnormality in the upper abdomen. Musculoskeletal: No acute bone abnormality. Review of the MIP images confirms the above findings. IMPRESSION: 1. Left lower lobe lesion adjacent to the left major fissure has decreased in size and there is no longer contrast enhancement or vascular flow within this lesion. Based on the previous examination, this lesion was a concerning for pseudoaneurysm. 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 Recommend continued short-term follow-up to ensure persistent thrombosis and regression of this lesion. 2. Scattered patchy and nodular densities throughout the left lower lobe. Overall, the left lower lobe densities have decreased in size. Decreased inflammatory changes in left lower lobe. There may be a new 4 mm nodule. Based on the interval change in these lesions/densities, suspect this represents an infectious or inflammatory process. 3. Left hilar lymphadenopathy. This hilar lymphadenopathy is new since 2020 and could be reactive. Recommend attention to this area on follow up imaging. 4. Pleural thickening near the left third rib. This is new since 2020 and may be related to the recent chest tube. Recommend attention to this area on follow up imaging. 5. Aortic Atherosclerosis (ICD10-I70.0) and Emphysema (ICD10-J43.9). 6. Cholelithiasis. Electronically Signed   By: Markus Daft M.D.   On: 03/21/2019 12:50   DG Chest Port 1 View  Result Date: 03/09/2019 CLINICAL DATA:  Left pneumothorax. EXAM: PORTABLE CHEST 1 VIEW COMPARISON:  Same day. FINDINGS: The heart size and mediastinal contours are within normal limits. Left-sided chest tube is unchanged in position. No pneumothorax pleural effusion is noted. Right lung is clear. Two rounded densities remain in the left lower  lobe which may represent residual inflammation, but nodules cannot be excluded. The visualized skeletal structures are unremarkable. IMPRESSION: Stable position of left-sided chest tube without definite pneumothorax. 2 rounded densities are noted in the left lower lobe which may represent inflammation, but nodules cannot be excluded. CT scan may be performed for further evaluation. Electronically Signed   By: Marijo Conception M.D.   On: 03/09/2019 13:32   DG Chest Port 1 View  Result Date: 03/09/2019 CLINICAL DATA:  Left pneumothorax EXAM: PORTABLE  CHEST 1 VIEW COMPARISON:  03/08/2019 FINDINGS: No significant change in AP portable examination. Left-sided chest tube remains in position with no significant residual pneumothorax. No significant change in left basilar airspace opacity. The right lung is normally aerated. Heart and mediastinum are unremarkable. IMPRESSION: No significant change in AP portable examination. Left-sided chest tube remains in position with no significant residual pneumothorax. No significant change in left basilar airspace opacity. Electronically Signed   By: Eddie Candle M.D.   On: 03/09/2019 08:51   On my review of the films the patient had malposition of the chest tube which was then repositioned on subsequent films , the left lower lung mass density first appeared on the film the day following placement of chest tube   I have independently reviewed the above radiology studies  and reviewed the findings with the patient.   Recent Lab Findings: Lab Results  Component Value Date   WBC 7.2 03/10/2019   HGB 11.3 (L) 03/10/2019   HCT 34.0 (L) 03/10/2019   PLT 229 03/10/2019   GLUCOSE 106 (H) 03/10/2019   CHOL 202 (H) 11/16/2017   TRIG 221.0 (H) 11/16/2017   HDL 40.90 11/16/2017   LDLDIRECT 152.0 11/16/2017   LDLCALC 154 (H) 08/31/2015   ALT 27 11/16/2017   AST 21 11/16/2017   NA 139 03/10/2019   K 4.1 03/10/2019   CL 107 03/10/2019   CREATININE 1.19 03/10/2019   BUN  23 03/10/2019   CO2 25 03/10/2019   TSH 1.39 08/31/2015   INR 1.1 (H) 11/16/2017   HGBA1C 6.3 08/31/2015      Assessment / Plan:   #1 formation of left lower lobe mass, with contrast following placement of chest tube on February 1, with holding of anticoagulation, and CT timed to accentuate pulmonary artery vessels the mass is decreased in size and no longer appears to have connection with contrast filling.-The time course of this is suspicious that its chest tube placement trauma.  At this point would continue holding anticoagulation,  in approximately 3 weeks obtain a follow-up CT of the chest. #2 history of atrial fibrillation had been on anticoagulation-the burden of his atrial fibrillation is unknown #3 waxing and waning pulmonary nodules especially left lower lobe-so far none none has been dominant and will need continued to have follow-up. #4 COPD    Patient will contact us immediately if he has any hemoptysis  We will plan to see him back in 3 weeks with a follow-up CTA of the chest.  I  spent 60 minutes with  the patient face to face .   Grace Isaac MD      [address] [place] [address] Office [phone]     03/22/2019 2:25 PM

## 2019-03-25 NOTE — Progress Notes (Signed)
Fax letter to [phone] per patient

## 2019-04-01 ENCOUNTER — Ambulatory Visit
Admission: RE | Admit: 2019-04-01 | Discharge: 2019-04-01 | Disposition: A | Discharge status: Home / Self Care | Payer: Medicare Other | Attending: Physician Assistant | Admitting: Physician Assistant

## 2019-04-01 ENCOUNTER — Ambulatory Visit (INDEPENDENT_AMBULATORY_CARE_PROVIDER_SITE_OTHER): Payer: Medicare Other | Admitting: Cardiothoracic Surgery

## 2019-04-01 ENCOUNTER — Encounter: Payer: Self-pay | Admitting: Cardiothoracic Surgery

## 2019-04-01 ENCOUNTER — Ambulatory Visit
Admission: RE | Admit: 2019-04-01 | Discharge: 2019-04-01 | Disposition: A | Discharge status: Home / Self Care | Payer: Medicare Other | Source: Ambulatory Visit | Attending: Physician Assistant | Admitting: Physician Assistant

## 2019-04-01 ENCOUNTER — Other Ambulatory Visit: Payer: Self-pay

## 2019-04-01 VITALS — BP 165/83 | HR 71 | Temp 98.2°F | Resp 14 | Ht 66.0 in | Wt 162.0 lb

## 2019-04-01 DIAGNOSIS — I700 Atherosclerosis of aorta: Secondary | ICD-10-CM | POA: Diagnosis not present

## 2019-04-01 DIAGNOSIS — J939 Pneumothorax, unspecified: Secondary | ICD-10-CM | POA: Insufficient documentation

## 2019-04-01 DIAGNOSIS — I708 Atherosclerosis of other arteries: Secondary | ICD-10-CM

## 2019-04-01 DIAGNOSIS — D1809 Hemangioma of other sites: Secondary | ICD-10-CM

## 2019-04-01 DIAGNOSIS — I729 Aneurysm of unspecified site: Secondary | ICD-10-CM

## 2019-04-01 DIAGNOSIS — R079 Chest pain, unspecified: Secondary | ICD-10-CM | POA: Diagnosis not present

## 2019-04-01 NOTE — Progress Notes (Signed)
  Patient ID: Victor Cortez, male   DOB: 1950/11/23, 69 y.o.   MRN: [id number]  HISTORY: He returns today in follow-up.  He has no new complaints.  He has not had any hemoptysis since his stop the Xarelto.  He did see Dr. Servando Cortez who did not recommend any intervention for his left lower lobe mass.   Vitals:   04/01/19 0857  BP: (!) 165/83  Pulse: 71  Resp: 14  Temp: 98.2 F (36.8 C)  SpO2: 94%     EXAM:    Resp: Lungs are clear bilaterally.  No respiratory distress, normal effort. Heart:  Regular without murmurs Abd:  Abdomen is soft, non distended and non tender. No masses are palpable.  There is no rebound and no guarding.  Neurological: Alert and oriented to person, place, and time. Coordination normal.  Skin: Skin is warm and dry. No rash noted. No diaphoretic. No erythema. No pallor.  Psychiatric: Normal mood and affect. Normal behavior. Judgment and thought content normal.    ASSESSMENT: He did have a chest x-ray made today which have independently reviewed.  I see no change in the left lower lobe lesion which measures approximately 2.8 cm.   PLAN:   The patient is scheduled to follow-up with Dr. Servando Cortez with a repeat CT scan of the chest.  I will see the patient afterwards.  He understands if he has any further episodes of hemoptysis he is to contact us immediately.    Victor Lewandowsky, MD

## 2019-04-01 NOTE — Patient Instructions (Signed)
Follow up with Dr Servando Snare as scheduled.   Follow-up with our office as needed.  Please call and ask to speak with a nurse if you develop questions or concerns.

## 2019-04-20 ENCOUNTER — Ambulatory Visit
Admission: RE | Admit: 2019-04-20 | Discharge: 2019-04-20 | Disposition: A | Discharge status: Home / Self Care | Payer: Medicare Other | Source: Ambulatory Visit | Attending: Cardiothoracic Surgery | Admitting: Cardiothoracic Surgery

## 2019-04-20 ENCOUNTER — Ambulatory Visit: Payer: Medicare Other

## 2019-04-20 ENCOUNTER — Other Ambulatory Visit: Payer: Self-pay

## 2019-04-20 DIAGNOSIS — R911 Solitary pulmonary nodule: Secondary | ICD-10-CM | POA: Insufficient documentation

## 2019-04-20 HISTORY — DX: Essential (primary) hypertension: I10

## 2019-04-20 MED ORDER — IOHEXOL 350 MG/ML SOLN
75.0000 mL | Freq: Once | INTRAVENOUS | Status: AC | PRN
  Administered 2019-04-20: 75 mL via INTRAVENOUS

## 2019-04-21 ENCOUNTER — Ambulatory Visit (INDEPENDENT_AMBULATORY_CARE_PROVIDER_SITE_OTHER): Payer: Medicare Other | Admitting: Cardiothoracic Surgery

## 2019-04-21 ENCOUNTER — Encounter: Payer: Self-pay | Admitting: Cardiothoracic Surgery

## 2019-04-21 VITALS — BP 175/90 | HR 67 | Temp 98.1°F | Resp 24 | Ht 66.0 in | Wt 159.0 lb

## 2019-04-21 DIAGNOSIS — I700 Atherosclerosis of aorta: Secondary | ICD-10-CM | POA: Diagnosis not present

## 2019-04-21 DIAGNOSIS — R911 Solitary pulmonary nodule: Secondary | ICD-10-CM | POA: Diagnosis not present

## 2019-04-21 DIAGNOSIS — I708 Atherosclerosis of other arteries: Secondary | ICD-10-CM | POA: Diagnosis not present

## 2019-04-21 NOTE — Progress Notes (Signed)
Victor Cortez       [name] [address]             [phone]                    [name] [place] Medical Record [id number] Date of Birth: 10/23/1950  Referring: Nestor Lewandowsky, MD Primary Care: Ria Bush, MD Primary Cardiologist: No primary care provider on file.  Chief Complaint:    Chief Complaint  Patient presents with  . Lung Lesion    F/U pulmonary nodules w/ CTA 04/20/19, s/p pneumothorax 03/07/19    History of Present Illness:    Victor Cortez 69 y.o. male is seen in the office  today for follow CT scan of the chest.  The patient had a recent hospitalization at [facility] for PTX.  He has has a long history of smoking 1-1/2 to 2 packs/day for 50 years, he quit smoking 6 years ago after he was hospitalized for pneumonia.  Since August 2017 he has been enrolled in a lung cancer screening program.  In August 2020 CT scan suggested new left lower lobe lung lesion, however a week later PET scan showed resolution of the lung nodule.  For at least 6 months the patient has had history of minor hemoptysis.  In February 2021 he awoke from sleep with shortness of breath, he went to work but because of persistent left chest discomfort and shortness of breath went to the [facility] emergency room on February 1.  He had a left pneumothorax on chest x-ray, chest tube was placed in the emergency room, from the patient's history and a series of x-rays there was some difficulty in placing this chest tube and it was repositioned.,  Ultimately his lung reexpanded and the chest tube was removed.   A repeat CT scan of the chest was done February 12th.  The patient was seen by Dr. Faith Rogue and referred to our office.  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  After review with interventional radiologist a repeat CT scan was done today specifically with contrast timing to evaluate the pulmonary  arteries.  In addition between the 2 scans the patient discontinued his anticoagulation with Xarelto .  He had been on Xarelto for atrial fibrillation first noted 6 years ago associated with his admission for pneumonia.  The patient is unaware of any palpitations or the duration of his atrial fibrillation, on exam in the office today is does not appear to be in atrial fibrillation  Since last seen the patient denies any shortness of breath, has had no hemoptysis, has had no chest pain.  He is unaware of any palpitations  He returns today with a follow-up CT scan.  Current Activity/ Functional Status:  Patient is independent with mobility/ambulation, transfers, ADL's, IADL's.   Zubrod Score: At the time of surgery this patient's most appropriate activity status/level should be described as: []     0    Normal activity, no symptoms [x]     1    Restricted in physical strenuous activity but ambulatory, able to do out light work []     2    Ambulatory and capable of self care, unable to do work activities, up and about               >50 % of waking hours                              []   3    Only limited self care, in bed greater than 50% of waking hours []     4    Completely disabled, no self care, confined to bed or chair []     5    Moribund   Past Medical History:  Diagnosis Date  . Aortic atherosclerosis ([place])    by CT  . Arthritis   . Atrial fibrillation with RVR ([place]) 06/2013   during hospitalization  . Basal cell carcinoma of nose 1998  . CAD (coronary artery disease)    by CT  . Cataract   . Cholelithiasis 10/2017   by CT  . COPD (chronic obstructive pulmonary disease) ([place])   . Dysrhythmia   . ED (erectile dysfunction)   . Ex-smoker   . GERD (gastroesophageal reflux disease)   . Glaucoma    Dr. Matilde Sprang  . Glaucoma   . Hepatic steatosis    by CT  . History of chicken pox   . History of pneumonia 06/2013   with sepsis and [facility] hospitalization  . Hyperlipidemia   .  Hypertension   . Rosacea   . Seborrheic dermatitis     Past Surgical History:  Procedure Laterality Date  . CYSTOSCOPY WITH URETHRAL DILATATION  12/04/2017   Procedure: CYSTOSCOPY WITH URETHRAL DILATATION;  Surgeon: Billey Co, MD;  Location: [facility] ORS;  Service: Urology;;  . CYSTOSCOPY/URETEROSCOPY/HOLMIUM LASER/STENT PLACEMENT Left 12/04/2017   Procedure: CYSTOSCOPY/URETEROSCOPY/HOLMIUM LASER/STENT PLACEMENT;  Surgeon: Billey Co, MD;  Location: [facility] ORS;  Service: Urology;  Laterality: Left;  . 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 ROTATOR CUFF REPAIR  2016    Family History  Problem Relation Age of Onset  . COPD Father   . Cancer Father        lung (smoker)  . COPD Mother   . CAD Neg Hx   . Stroke Neg Hx   . Bladder Cancer Neg Hx   . Prostate cancer Neg Hx      Social History   Tobacco Use  Smoking Status Former Smoker  . Packs/day: 2.00  . Years: 53.00  . Pack years: 106.00  . Types: Cigarettes  . Quit date: 06/03/2013  . Years since quitting: 5.8  Smokeless Tobacco Never Used    Social History   Substance and Sexual Activity  Alcohol Use No     No Known Allergies  Current Outpatient Medications  Medication Sig Dispense Refill  . aspirin EC 81 MG tablet Take 81 mg by mouth daily. 1 tab by mouth in AM    . AZOPT 1 % ophthalmic suspension Place 1 drop into both eyes 2 (two) times daily.     . calcium carbonate (TUMS) 500 MG chewable tablet Chew 1 tablet (200 mg of elemental calcium total) by mouth daily as needed for indigestion or heartburn.    . Cyanocobalamin (RA VITAMIN B-12 TR) 1000 MCG TBCR Take 1 tablet by mouth daily. One tab by mouth daily    . Glucosamine-Chondroit-Vit C-Mn (GLUCOSAMINE 1500 COMPLEX) CAPS Take 1 capsule by mouth daily.    . Ipratropium-Albuterol (COMBIVENT RESPIMAT) 20-100 MCG/ACT  AERS respimat Inhale 1 puff into the lungs every 6 (six) hours as needed for wheezing or shortness of breath. 4 g 6  . losartan (COZAAR) 100 MG tablet Take 100 mg by mouth daily.    . Multiple Vitamin (MULTI-VITAMINS) TABS Take by  mouth. 1 tab by mouth daily    . Omega-3 Fatty Acids (FISH OIL) 1000 MG CAPS Take 1 capsule by mouth daily. 1 cap by mouth daily    . omeprazole (PRILOSEC) 40 MG capsule Take 1 capsule (40 mg total) by mouth daily. For 3 weeks then as needed 30 capsule 3  . rosuvastatin (CRESTOR) 40 MG tablet Take 40 mg by mouth daily.    . sotalol (BETAPACE) 80 MG tablet Take 80 mg by mouth 2 (two) times daily. One tab by mouth twice a day    . tadalafil (ADCIRCA/CIALIS) 20 MG tablet Take 1 tablet (20 mg total) by mouth daily as needed for erectile dysfunction. 10 tablet 11   No current facility-administered medications for this visit.      Review of Systems:     Cardiac Review of Systems: [Y] = yes  or   [ N ] = no   Chest Pain [ N  ]  Resting SOB [ N ] Exertional SOB  [ Y ]  Orthopnea Aqua.Slicker  ]   Pedal Edema Aqua.Slicker  ]    Palpitations [ N] Syncope  Aqua.Slicker  ]   Presyncope [ N ]   General Review of Systems: [Y] = yes [  ]=no Constitional: recent weight change [  ];  Wt loss over the last 3 months [   ] anorexia [  ]; fatigue [  ]; nausea [  ]; night sweats [  ]; fever [  ]; or chills [  ];           Eye : blurred vision [  ]; diplopia [   ]; vision changes [  ];  Amaurosis fugax[  ]; Resp: cough [  ];  wheezing[ y ];  hemoptysis[ y ]; shortness of breath[y  ]; paroxysmal nocturnal dyspnea[  ]; dyspnea on exertion[ y ]; or orthopnea[  ];  GI:  gallstones[  ], vomiting[  ];  dysphagia[  ]; melena[  ];  hematochezia [  ]; heartburn[  ];   Hx of  Colonoscopy[  ]; GU: kidney stones [  ]; hematuria[  ];   dysuria [  ];  nocturia[  ];  history of     obstruction [  ]; urinary frequency [  ]             Skin: rash, swelling[  ];, hair loss[  ];  peripheral edema[  ];  or itching[  ]; Musculosketetal:  myalgias[  ];  joint swelling[  ];  joint erythema[  ];  joint pain[  ];  back pain[  ];  Heme/Lymph: bruising[  ];  bleeding[  ];  anemia[  ];  Neuro: TIA[  ];  headaches[  ];  stroke[  ];  vertigo[  ];  seizures[  ];   paresthesias[  ];  difficulty walking[  ];  Psych:depression[  ]; anxiety[  ];  Endocrine: diabetes[  ];  thyroid dysfunction[  ];  Immunizations: Flu up to date [  ]; Pneumococcal up to date [  ];  Other:     PHYSICAL EXAMINATION: BP (!) 175/90 (BP Location: Right Arm)   Pulse 67   Temp 98.1 F (36.7 C) (Temporal)   Resp (!) 24   Ht 5\' 6"  (1.676 m)   Wt 72.1 kg   SpO2 95% Comment: RA  BMI 25.66 kg/m  General appearance: alert, cooperative and no distress Head: Normocephalic, without obvious abnormality, atraumatic Neck: no adenopathy, no carotid  bruit, no JVD, supple, symmetrical, trachea midline and thyroid not enlarged, symmetric, no tenderness/mass/nodules Lymph nodes: Cervical, supraclavicular, and axillary nodes normal. Resp: clear to auscultation bilaterally Cardio: regular rate and rhythm, S1, S2 normal, no murmur, click, rub or gallop GI: soft, non-tender; bowel sounds normal; no masses,  no organomegaly Extremities: extremities normal, atraumatic, no cyanosis or edema Neurologic: Grossly normal  Diagnostic Studies & Laboratory data:     Recent Radiology Findings:   CT ANGIO CHEST PE W OR WO CONTRAST  Result Date: 04/20/2019 CLINICAL DATA:  Left lung nodule EXAM: CT ANGIOGRAPHY CHEST WITH CONTRAST TECHNIQUE: Multidetector CT imaging of the chest was performed using the standard protocol during bolus administration of intravenous contrast. Multiplanar CT image reconstructions and MIPs were obtained to evaluate the vascular anatomy. CONTRAST:  45mL OMNIPAQUE IOHEXOL 350 MG/ML SOLN COMPARISON:  03/21/2019 FINDINGS: Cardiovascular: Heart is normal size. Aorta is normal caliber. Mild atherosclerotic irregularity in the descending thoracic aorta. No evidence  of pulmonary embolus. Mediastinum/Nodes: No mediastinal, hilar, or axillary adenopathy. Trachea and esophagus are unremarkable. Thyroid unremarkable. Lungs/Pleura: Continued smooth round nodule in the left lower lobe. This measures 2.1 x 2.1 x 1.9 cm compared with 3.6 x 2.4 x 2.1 cm previously. Nodular densities posteriorly in the left lower lobe have further decreased in size and nearly resolved. Residual nodule measures 5 mm. This area measured up to 2.4 cm previously. Residual scarring in the area. No new or enlarging pulmonary nodules. No pleural effusions. Upper Abdomen: Imaging into the upper abdomen shows no acute findings. Gallstones noted within the gallbladder. Musculoskeletal: Chest wall soft tissues are unremarkable. No acute bony abnormality. Review of the MIP images confirms the above findings. IMPRESSION: Smooth round nodule in the left lower lobe further decreased in size, now measuring up to 2.1 cm. This presumably represents further decreased size of a thrombosed pseudoaneurysm. Additional nodular areas posteriorly in the left lower lobe have nearly completely resolved, likely resolving inflammatory process. Residual scarring in the area. No acute cardiopulmonary disease. Aortic Atherosclerosis (ICD10-I70.0). Electronically Signed   By: Rolm Baptise M.D.   On: 04/20/2019 14:48   I have independently reviewed the above radiology studies  and reviewed the findings with the patient.    DG Chest 1 View  Result Date: 03/08/2019 CLINICAL DATA:  Chest tube EXAM: CHEST  1 VIEW COMPARISON:  03/07/2019. FINDINGS: Left chest tube remains in place. No pneumothorax. Mild left lower lobe airspace disease has improved in the interval. Small left effusion has improved. Right lung remains clear. IMPRESSION: Left chest tube in place without pneumothorax Improved aeration left lung base. Electronically Signed   By: Franchot Gallo M.D.   On: 03/08/2019 16:46   DG Chest 2 View  Result Date:  03/16/2019 CLINICAL DATA:  Follow-up left lung pneumothorax EXAM: CHEST - 2 VIEW COMPARISON:  CT September 27, 2018, radiograph 03/10/2019, PET CT October 04, 2018 FINDINGS: No residual or recurrent left pneumothorax. Masslike opacity is again seen in the left lower lobe. Blunting of the left costophrenic sulcus may reflect small volume pleural fluid or scarring. Remaining portions of the lungs are clear. The cardiomediastinal contours are unremarkable. No acute osseous or soft tissue abnormality. Degenerative changes are present in the imaged spine and shoulders. IMPRESSION: No residual or recurrent left pneumothorax. Increasing size and attenuation of the left lower lobe masslike opacity concerning for infection or neoplasm. Consider further evaluation with cross-sectional imaging as this has increased in size from most recent cross-sectional comparison studies. Scarring versus trace effusion  of the left costophrenic sulcus. Electronically Signed   By: Lovena Le M.D.   On: 03/16/2019 00:03   DG Chest 2 View  Result Date: 03/10/2019 CLINICAL DATA:  Follow-up left pneumothorax after chest tube removal EXAM: CHEST - 2 VIEW COMPARISON:  Chest radiograph from one day prior. FINDINGS: Stable cardiomediastinal silhouette with normal heart size. No pneumothorax. No pleural effusion. Persistent clustered nodular opacities in the lower left lung are unchanged. No pulmonary edema. IMPRESSION: 1. No pneumothorax. 2. Stable clustered nodular opacities in the lower left lung, indeterminate for neoplasm versus infection. Recommend either short-term follow-up chest radiographs or further evaluation with chest CT. Electronically Signed   By: Ilona Sorrel M.D.   On: 03/10/2019 09:23   DG Chest 2 View  Result Date: 03/07/2019 CLINICAL DATA:  69 year old male with shortness of breath. History of COPD. EXAM: CHEST - 2 VIEW COMPARISON:  Chest radiograph dated 06/04/2013. FINDINGS: There is a large left pneumothorax (greater than  20%) measuring approximately 5.5 cm from the lateral pleural surface. Left lung base atelectasis. There is a background of emphysema. The right lung is clear. No pleural effusion. No mediastinal shift. Atherosclerotic calcification of the aortic arch. No acute osseous pathology. IMPRESSION: Large left pneumothorax.  No mediastinal shift. These results were called by telephone at the time of interpretation on 03/07/2019 at 8:40 pm to provider Avala , who verbally acknowledged these results. Electronically Signed   By: Anner Crete M.D.   On: 03/07/2019 20:45   CT Chest W Contrast  Result Date: 03/17/2019 CLINICAL DATA:  Follow-up for left lower lung mass noted on recent chest radiographs. EXAM: CT CHEST WITH CONTRAST TECHNIQUE: Multidetector CT imaging of the chest was performed during intravenous contrast administration. CONTRAST:  75mL OMNIPAQUE IOHEXOL 300 MG/ML  SOLN COMPARISON:  Current chest radiograph, 03/15/2019. Prior chest CT, 09/27/2018 FINDINGS: Cardiovascular: Heart normal in size and configuration. No pericardial effusion. Mild three-vessel coronary artery calcifications. None great vessels normal in caliber. Mild aortic atherosclerosis. Mediastinum/Nodes: Prominent left infrahilar lymph nodes, largest 1 cm short axis, new since the prior CT. No mediastinal or right hilar masses or enlarged lymph nodes. Normal thyroid. No neck base or axillary masses or adenopathy. Trachea and esophagus are unremarkable. Lungs/Pleura: Well-defined oval mass in the left lower lobe abutting the oblique fissure, measuring 2.6 x 2.0 x 2.5 cm. This contains a central oval area high attenuation, mean 170 Hounsfield units, consistent with an enhancing vascular structure. There are additional left lower lobe opacities that are more irregular, 1 centered in the left lower lobe, image 104, series 3, measuring 2.1 x 1.5 x 1.7 cm. There are adjacent additional irregular opacities with hazy and reticular opacities  along the margins that extend from the left lower lobe adjacent to the oblique fissure, image 83, inferiorly to the posterior lung base. On the prior exam there was a focal opacity in the left lower lobe, which corresponds to the opacity noted on image 104, series 3. This opacity is increased in size. Remainder of the lungs is clear. Trace left pleural effusion. Small area of nonspecific pleural thickening noted in the left upper hemithorax centered on image 60, series 2. No pneumothorax. Upper Abdomen: No acute findings.  Gallstones. Musculoskeletal: No fracture or acute finding. No osteoblastic or osteolytic lesions. IMPRESSION: 1. Masslike opacity noted on the current chest radiograph appears to be a combination of a well-defined, 2.5 cm left lower lobe opacity and adjacent more irregular focal opacities. The well-defined 2.5 cm opacity contains  a central oval area of high attenuation consistent with an enhancing vascular structure. This could reflect a pseudoaneurysm. It is new since the prior CT. There is no defined feeding or draining vessel. 2. Adjacent irregular focal opacities in the left lower lobe are noted, 1 enlarged compared to the prior CT and the others new. Although neoplastic disease is in the differential diagnosis, these findings are suspected to be inflammatory/infectious in etiology. 3. There is mild associated left infrahilar adenopathy. 4. Remainder of the lungs is clear. 5. Mild coronary artery and aortic atherosclerosis. Aortic Atherosclerosis (ICD10-I70.0). Electronically Signed   By: Lajean Manes M.D.   On: 03/17/2019 12:18   CT ANGIO CHEST PE W OR WO CONTRAST  Result Date: 03/21/2019 CLINICAL DATA:  Evaluate for vascular lesion or pseudoaneurysm. History of left pneumothorax and chest tube. EXAM: CT ANGIOGRAPHY CHEST WITH CONTRAST TECHNIQUE: Multidetector CT imaging of the chest was performed using the standard protocol during bolus administration of intravenous contrast.  Multiplanar CT image reconstructions and MIPs were obtained to evaluate the vascular anatomy. CONTRAST:  68mL ISOVUE-370 IOPAMIDOL (ISOVUE-370) INJECTION 76% COMPARISON:  03/17/2019 FINDINGS: Cardiovascular: There is contrast within the aorta and the pulmonary arteries on this examination. Normal appearance of the pulmonary arteries without filling defects or pulmonary emboli. Mild atherosclerotic disease in the distal aortic arch and descending thoracic aorta without aneurysm or dissection. Ascending thoracic aorta measures up to 3.4 cm. Great vessels are patent. Mild atherosclerotic disease in left subclavian artery. Heart size is normal. No significant pericardial fluid. Mediastinum/Nodes: Normal appearance of the mediastinal structures. Stable fullness or lymphadenopathy in left hilum measuring up to 1.1 cm on sequence 4, image 59. No significant axillary lymph node enlargement. Lungs/Pleura: Trachea and mainstem bronchi are patent. Emphysematous changes noted in the upper lobes. No large pleural effusions. Focal pleural thickening along the left anterolateral chest adjacent to the third rib that is new since 2020 and could be related to prior chest tube.Again noted is a rounded lesion in left lower lobe adjacent to the left major fissure. This lesion no longer contains contrast as a did on the recent examination. Previously, this was concerning for a vascular pseudoaneurysm. Structure now measures 2.1 x 2.4 x 3.6 cm and previously measured 2.4 x 2.4 x 4.1 cm. There continues to be additional small nodular structures in the left lower lobe. Lesion along the posterior left lower lobe on sequence 7, image 108 is a multi lobulated structure measuring 2.2 x 1.1 cm, previously measured 2.1 x 1.7 cm. Lesion is more distinct and has decreased inflammatory changes. Overall, the parenchymal densities and lesions in the left lower lobe have decreased in size. There continues to be dependent densities in left lower lobe  which have not significantly changed. No significant pleural fluid. New 4 mm nodule in the left lower lobe on sequence 7, image 88. Upper Abdomen: Multiple calcified gallstones. Possible cyst involving the posterolateral left kidney. Stable lymph node or nodule just medial to the left adrenal gland. This node measures roughly 1.0 cm in the short axis and measured roughly 0.9 cm on 09/25/2015. Small lymph nodes in the upper abdomen are stable. No acute abnormality in the upper abdomen. Musculoskeletal: No acute bone abnormality. Review of the MIP images confirms the above findings. IMPRESSION: 1. Left lower lobe lesion adjacent to the left major fissure has decreased in size and there is no longer contrast enhancement or vascular flow within this lesion. Based on the previous examination, this lesion was a concerning for  pseudoaneurysm. 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 Recommend continued short-term follow-up to ensure persistent thrombosis and regression of this lesion. 2. Scattered patchy and nodular densities throughout the left lower lobe. Overall, the left lower lobe densities have decreased in size. Decreased inflammatory changes in left lower lobe. There may be a new 4 mm nodule. Based on the interval change in these lesions/densities, suspect this represents an infectious or inflammatory process. 3. Left hilar lymphadenopathy. This hilar lymphadenopathy is new since 2020 and could be reactive. Recommend attention to this area on follow up imaging. 4. Pleural thickening near the left third rib. This is new since 2020 and may be related to the recent chest tube. Recommend attention to this area on follow up imaging. 5. Aortic Atherosclerosis (ICD10-I70.0) and Emphysema (ICD10-J43.9). 6. Cholelithiasis. Electronically Signed   By: Markus Daft M.D.   On: 03/21/2019 12:50   DG Chest Port 1 View  Result Date: 03/09/2019 CLINICAL DATA:  Left  pneumothorax. EXAM: PORTABLE CHEST 1 VIEW COMPARISON:  Same day. FINDINGS: The heart size and mediastinal contours are within normal limits. Left-sided chest tube is unchanged in position. No pneumothorax pleural effusion is noted. Right lung is clear. Two rounded densities remain in the left lower lobe which may represent residual inflammation, but nodules cannot be excluded. The visualized skeletal structures are unremarkable. IMPRESSION: Stable position of left-sided chest tube without definite pneumothorax. 2 rounded densities are noted in the left lower lobe which may represent inflammation, but nodules cannot be excluded. CT scan may be performed for further evaluation. Electronically Signed   By: Marijo Conception M.D.   On: 03/09/2019 13:32   DG Chest Port 1 View  Result Date: 03/09/2019 CLINICAL DATA:  Left pneumothorax EXAM: PORTABLE CHEST 1 VIEW COMPARISON:  03/08/2019 FINDINGS: No significant change in AP portable examination. Left-sided chest tube remains in position with no significant residual pneumothorax. No significant change in left basilar airspace opacity. The right lung is normally aerated. Heart and mediastinum are unremarkable. IMPRESSION: No significant change in AP portable examination. Left-sided chest tube remains in position with no significant residual pneumothorax. No significant change in left basilar airspace opacity. Electronically Signed   By: Eddie Candle M.D.   On: 03/09/2019 08:51   On my review of the films the patient had malposition of the chest tube which was then repositioned on subsequent films , the left lower lung mass density first appeared on the film the day following placement of chest tube   I have independently reviewed the above radiology studies  and reviewed the findings with the patient.   Recent Lab Findings: Lab Results  Component Value Date   WBC 7.2 03/10/2019   HGB 11.3 (L) 03/10/2019   HCT 34.0 (L) 03/10/2019   PLT 229 03/10/2019   GLUCOSE  106 (H) 03/10/2019   CHOL 202 (H) 11/16/2017   TRIG 221.0 (H) 11/16/2017   HDL 40.90 11/16/2017   LDLDIRECT 152.0 11/16/2017   LDLCALC 154 (H) 08/31/2015   ALT 27 11/16/2017   AST 21 11/16/2017   NA 139 03/10/2019   K 4.1 03/10/2019   CL 107 03/10/2019   CREATININE 1.19 03/10/2019   BUN 23 03/10/2019   CO2 25 03/10/2019   TSH 1.39 08/31/2015   INR 1.1 (H) 11/16/2017   HGBA1C 6.3 08/31/2015      Assessment / Plan:   #1 formation of left lower lobe mass, with contrast following placement of chest tube  on February 1, with holding of anticoagulation, and CT timed to accentuate pulmonary artery vessels the mass continues to decreased in size and no longer appears to have connection with contrast filling.-The time course of this is suspicious that its chest tube placement trauma.  Now about 6 weeks following initial chest tube placement the patient has had continue decrease in size of both the index mass and other smaller nodularity in the left lower lobe that has almost completely disappeared.  #2 history of atrial fibrillation had been on anticoagulation-the burden of his atrial fibrillation is unknown #3 waxing and waning pulmonary nodules especially left lower lobe-we will need to continue to be followed on subsequent scans  #4 COPD    I reviewed the films with the patient and with continued decrease in size we will continue to follow with CT-repeat CT in 3 to 4 months  Patient remains off Xarelto at the current time-in the office today appears to be in sinus rhythm  I  spent 28 with  the patient face to face and and revieweing CT scans current and previous    Grace Isaac MD      [place] [address] Office [phone]     04/21/2019 9:39 AM

## 2019-05-10 DIAGNOSIS — Z23 Encounter for immunization: Secondary | ICD-10-CM | POA: Diagnosis not present

## 2019-06-08 DIAGNOSIS — Z23 Encounter for immunization: Secondary | ICD-10-CM | POA: Diagnosis not present

## 2019-06-13 ENCOUNTER — Other Ambulatory Visit: Payer: Self-pay | Admitting: *Deleted

## 2019-06-13 DIAGNOSIS — R911 Solitary pulmonary nodule: Secondary | ICD-10-CM

## 2019-07-28 ENCOUNTER — Ambulatory Visit (INDEPENDENT_AMBULATORY_CARE_PROVIDER_SITE_OTHER): Payer: Medicare Other | Admitting: Cardiothoracic Surgery

## 2019-07-28 ENCOUNTER — Other Ambulatory Visit: Payer: Self-pay

## 2019-07-28 ENCOUNTER — Ambulatory Visit
Admission: RE | Admit: 2019-07-28 | Discharge: 2019-07-28 | Disposition: A | Discharge status: Home / Self Care | Payer: Medicare Other | Source: Ambulatory Visit | Attending: Cardiothoracic Surgery | Admitting: Cardiothoracic Surgery

## 2019-07-28 VITALS — BP 145/80 | HR 75 | Temp 98.1°F | Resp 20 | Ht 66.0 in | Wt 157.0 lb

## 2019-07-28 DIAGNOSIS — R911 Solitary pulmonary nodule: Secondary | ICD-10-CM

## 2019-07-28 DIAGNOSIS — I700 Atherosclerosis of aorta: Secondary | ICD-10-CM

## 2019-07-28 DIAGNOSIS — I708 Atherosclerosis of other arteries: Secondary | ICD-10-CM | POA: Diagnosis not present

## 2019-07-28 NOTE — Progress Notes (Signed)
[address]       [place] [address]             [phone]                    Victor Cortez Medical Record #[id number] Date of Birth: [birth year]  Referring: Victor Lewandowsky, MD Primary Care: Victor Bush, MD Primary Cardiologist: No primary care provider on file.  Chief Complaint:    Chief Complaint  Patient presents with  . Lung Lesion    3 month f/u with Chest CT    History of Present Illness:    Victor Cortez 69 y.o. male is seen in the office  today for follow CT scan of the chest.  Is a follow-up after the recent hospitalization at [facility] for pneumothorax   He has has a long history of smoking 1-1/2 to 2 packs/day for 50 years, he quit smoking 6 years ago after he was hospitalized for pneumonia.  Since August 2017 he has been enrolled in a lung cancer screening program.  In August 2020 CT scan suggested new left lower lobe lung lesion, however a week later PET scan showed resolution of the lung nodule.  For at least 6 months the patient has had history of minor hemoptysis.  In February 2021 he awoke from sleep with shortness of breath, he went to work but because of persistent left chest discomfort and shortness of breath went to the [facility] emergency room on February 1.  He had a left pneumothorax on chest x-ray, chest tube was placed in the emergency room, from the patient's history and a series of x-rays there was some difficulty in placing this chest tube and it was repositioned.,  Ultimately his lung reexpanded and the chest tube was removed.    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  After review with interventional radiologist a repeat CT scan was done today specifically with contrast timing to evaluate the pulmonary arteries.  In addition between the 2 scans the patient discontinued his anticoagulation with Xarelto .  He had been on Xarelto for  atrial fibrillation first noted 6 years ago associated with his admission for pneumonia.  The patient is unaware of any palpitations or the duration of his atrial fibrillation, in the office today the patient is in sinus rhythm.  He remains off Xarelto  He returns today with a follow-up CT scan.  Current Activity/ Functional Status:  Patient is independent with mobility/ambulation, transfers, ADL's, IADL's.   Zubrod Score: At the time of surgery this patient's most appropriate activity status/level should be described as: []     0    Normal activity, no symptoms [x]     1    Restricted in physical strenuous activity but ambulatory, able to do out light work []     2    Ambulatory and capable of self care, unable to do work activities, up and about               >50 % of waking hours                              []     3    Only limited self care, in bed greater than 50% of waking hours []     4    Completely disabled,  no self care, confined to bed or chair []     5    Moribund   Past Medical History:  Diagnosis Date  . Aortic atherosclerosis ([place])    by CT  . Arthritis   . Atrial fibrillation with RVR ([place]) 06/2013   during hospitalization  . Basal cell carcinoma of nose 1998  . CAD (coronary artery disease)    by CT  . Cataract   . Cholelithiasis 10/2017   by CT  . COPD (chronic obstructive pulmonary disease) ([place])   . Dysrhythmia   . ED (erectile dysfunction)   . Ex-smoker   . GERD (gastroesophageal reflux disease)   . Glaucoma    Dr. Matilde Sprang  . Glaucoma   . Hepatic steatosis    by CT  . History of chicken pox   . History of pneumonia 06/2013   with sepsis and [facility] hospitalization  . Hyperlipidemia   . Hypertension   . Rosacea   . Seborrheic dermatitis     Past Surgical History:  Procedure Laterality Date  . CYSTOSCOPY WITH URETHRAL DILATATION  12/04/2017   Procedure: CYSTOSCOPY WITH URETHRAL DILATATION;  Surgeon: Billey Co, MD;  Location: [facility] ORS;  Service:  Urology;;  . CYSTOSCOPY/URETEROSCOPY/HOLMIUM LASER/STENT PLACEMENT Left 12/04/2017   Procedure: CYSTOSCOPY/URETEROSCOPY/HOLMIUM LASER/STENT PLACEMENT;  Surgeon: Billey Co, MD;  Location: [facility] ORS;  Service: Urology;  Laterality: Left;  . 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 ROTATOR CUFF REPAIR  2016    Family History  Problem Relation Age of Onset  . COPD Father   . Cancer Father        lung (smoker)  . COPD Mother   . CAD Neg Hx   . Stroke Neg Hx   . Bladder Cancer Neg Hx   . Prostate cancer Neg Hx      Social History   Tobacco Use  Smoking Status Former Smoker  . Packs/day: 2.00  . Years: 53.00  . Pack years: 106.00  . Types: Cigarettes  . Quit date: 06/03/2013  . Years since quitting: 6.1  Smokeless Tobacco Never Used    Social History   Substance and Sexual Activity  Alcohol Use No     No Known Allergies  Current Outpatient Medications  Medication Sig Dispense Refill  . aspirin EC 81 MG tablet Take 81 mg by mouth daily. 1 tab by mouth in AM    . AZOPT 1 % ophthalmic suspension Place 1 drop into both eyes 2 (two) times daily.     . calcium carbonate (TUMS) 500 MG chewable tablet Chew 1 tablet (200 mg of elemental calcium total) by mouth daily as needed for indigestion or heartburn.    . Cyanocobalamin (RA VITAMIN B-12 TR) 1000 MCG TBCR Take 1 tablet by mouth daily. One tab by mouth daily    . Glucosamine-Chondroit-Vit C-Mn (GLUCOSAMINE 1500 COMPLEX) CAPS Take 1 capsule by mouth daily.    . Ipratropium-Albuterol (COMBIVENT RESPIMAT) 20-100 MCG/ACT AERS respimat Inhale 1 puff into the lungs every 6 (six) hours as needed for wheezing or shortness of breath. 4 g 6  . losartan (COZAAR) 100 MG tablet Take 100 mg by mouth daily.    . Multiple Vitamin (MULTI-VITAMINS) TABS Take by mouth. 1 tab by mouth daily    . Omega-3  Fatty Acids (FISH OIL) 1000 MG CAPS Take 1 capsule by mouth daily. 1 cap  by mouth daily    . omeprazole (PRILOSEC) 40 MG capsule Take 1 capsule (40 mg total) by mouth daily. For 3 weeks then as needed 30 capsule 3  . rosuvastatin (CRESTOR) 40 MG tablet Take 40 mg by mouth daily.    . sotalol (BETAPACE) 80 MG tablet Take 80 mg by mouth 2 (two) times daily. One tab by mouth twice a day    . tadalafil (ADCIRCA/CIALIS) 20 MG tablet Take 1 tablet (20 mg total) by mouth daily as needed for erectile dysfunction. 10 tablet 11   No current facility-administered medications for this visit.      PHYSICAL EXAMINATION: BP (!) 145/80   Pulse 75   Temp 98.1 F (36.7 C) (Skin)   Resp 20   Ht 5\' 6"  (1.676 m)   Wt 157 lb (71.2 kg)   SpO2 96% Comment: RA  BMI 25.34 kg/m  General appearance: alert, cooperative and no distress Lymph nodes: Cervical, supraclavicular, and axillary nodes normal. Resp: clear to auscultation bilaterally Cardio: regular rate and rhythm, S1, S2 normal, no murmur, click, rub or gallop GI: soft, non-tender; bowel sounds normal; no masses,  no organomegaly Extremities: extremities normal, atraumatic, no cyanosis or edema Neurologic: Grossly normal  Diagnostic Studies & Laboratory data:     Recent Radiology Findings:  CT CHEST WO CONTRAST  Result Date: 07/28/2019 CLINICAL DATA:  Evaluate lung nodule. Former smoker. Shortness of breath. EXAM: CT CHEST WITHOUT CONTRAST TECHNIQUE: Multidetector CT imaging of the chest was performed following the standard protocol without IV contrast. COMPARISON:  04/20/2018 FINDINGS: Cardiovascular: Normal heart size. No pericardial effusion. Aortic atherosclerosis. Three vessel coronary artery calcifications identified. Mediastinum/Nodes: No enlarged mediastinal or axillary lymph nodes. Thyroid gland, trachea, and esophagus demonstrate no significant findings. Lungs/Pleura: No pleural effusion. Centrilobular emphysema. Index subpleural nodule  within the left midlung abutting the oblique fissure measures 1.4 x 1.1 cm, image 94/8. This is compared with 2.4 x 1.8 cm on 03/21/19. On 04/20/2019 this measured 2.1 x 1.9 cm. The previous 5 mm posterior left upper lobe lung nodule has resolved in the interval. Stable 4 mm posterolateral right upper lobe lung nodule, image 32/8. This is remained unchanged since 09/24/2017 compatible with a benign nodule. Tiny calcified granuloma is noted in the posterior left lower lobe, image 115/8. No new suspicious lung nodule identified bilaterally. Upper Abdomen: No acute abnormality. Musculoskeletal: No chest wall mass or suspicious bone lesions identified. IMPRESSION: 1. Continued interval decrease in size of left midlung subpleural nodule. Previously characterized as a thrombosed pseudoaneurysm. 2. Interval resolution of previous 5 mm posterior left upper lobe lung nodule. 3. Stable 4 mm right upper lobe lung nodule compatible with a benign nodule. 4. Three vessel coronary artery calcifications noted. 5. Emphysema and aortic atherosclerosis. Aortic Atherosclerosis (ICD10-I70.0) and Emphysema (ICD10-J43.9). Electronically Signed   By: Kerby Moors M.D.   On: 07/28/2019 13:20       CT ANGIO CHEST PE W OR WO CONTRAST  Result Date: 04/20/2019 CLINICAL DATA:  Left lung nodule EXAM: CT ANGIOGRAPHY CHEST WITH CONTRAST TECHNIQUE: Multidetector CT imaging of the chest was performed using the standard protocol during bolus administration of intravenous contrast. Multiplanar CT image reconstructions and MIPs were obtained to evaluate the vascular anatomy. CONTRAST:  64mL OMNIPAQUE IOHEXOL 350 MG/ML SOLN COMPARISON:  03/21/2019 FINDINGS: Cardiovascular: Heart is normal size. Aorta is normal caliber. Mild atherosclerotic irregularity in the descending thoracic aorta. No evidence of pulmonary embolus. Mediastinum/Nodes: No mediastinal, hilar, or axillary adenopathy. Trachea and esophagus are unremarkable.  Thyroid unremarkable.  Lungs/Pleura: Continued smooth round nodule in the left lower lobe. This measures 2.1 x 2.1 x 1.9 cm compared with 3.6 x 2.4 x 2.1 cm previously. Nodular densities posteriorly in the left lower lobe have further decreased in size and nearly resolved. Residual nodule measures 5 mm. This area measured up to 2.4 cm previously. Residual scarring in the area. No new or enlarging pulmonary nodules. No pleural effusions. Upper Abdomen: Imaging into the upper abdomen shows no acute findings. Gallstones noted within the gallbladder. Musculoskeletal: Chest wall soft tissues are unremarkable. No acute bony abnormality. Review of the MIP images confirms the above findings. IMPRESSION: Smooth round nodule in the left lower lobe further decreased in size, now measuring up to 2.1 cm. This presumably represents further decreased size of a thrombosed pseudoaneurysm. Additional nodular areas posteriorly in the left lower lobe have nearly completely resolved, likely resolving inflammatory process. Residual scarring in the area. No acute cardiopulmonary disease. Aortic Atherosclerosis (ICD10-I70.0). Electronically Signed   By: Rolm Baptise M.D.   On: 04/20/2019 14:48   I have independently reviewed the above radiology studies  and reviewed the findings with the patient.    DG Chest 1 View  Result Date: 03/08/2019 CLINICAL DATA:  Chest tube EXAM: CHEST  1 VIEW COMPARISON:  03/07/2019. FINDINGS: Left chest tube remains in place. No pneumothorax. Mild left lower lobe airspace disease has improved in the interval. Small left effusion has improved. Right lung remains clear. IMPRESSION: Left chest tube in place without pneumothorax Improved aeration left lung base. Electronically Signed   By: Franchot Gallo M.D.   On: 03/08/2019 16:46   DG Chest 2 View  Result Date: 03/16/2019 CLINICAL DATA:  Follow-up left lung pneumothorax EXAM: CHEST - 2 VIEW COMPARISON:  CT September 27, 2018, radiograph 03/10/2019, PET CT October 04, 2018  FINDINGS: No residual or recurrent left pneumothorax. Masslike opacity is again seen in the left lower lobe. Blunting of the left costophrenic sulcus may reflect small volume pleural fluid or scarring. Remaining portions of the lungs are clear. The cardiomediastinal contours are unremarkable. No acute osseous or soft tissue abnormality. Degenerative changes are present in the imaged spine and shoulders. IMPRESSION: No residual or recurrent left pneumothorax. Increasing size and attenuation of the left lower lobe masslike opacity concerning for infection or neoplasm. Consider further evaluation with cross-sectional imaging as this has increased in size from most recent cross-sectional comparison studies. Scarring versus trace effusion of the left costophrenic sulcus. Electronically Signed   By: Lovena Le M.D.   On: 03/16/2019 00:03   DG Chest 2 View  Result Date: 03/10/2019 CLINICAL DATA:  Follow-up left pneumothorax after chest tube removal EXAM: CHEST - 2 VIEW COMPARISON:  Chest radiograph from one day prior. FINDINGS: Stable cardiomediastinal silhouette with normal heart size. No pneumothorax. No pleural effusion. Persistent clustered nodular opacities in the lower left lung are unchanged. No pulmonary edema. IMPRESSION: 1. No pneumothorax. 2. Stable clustered nodular opacities in the lower left lung, indeterminate for neoplasm versus infection. Recommend either short-term follow-up chest radiographs or further evaluation with chest CT. Electronically Signed   By: Ilona Sorrel M.D.   On: 03/10/2019 09:23   DG Chest 2 View  Result Date: 03/07/2019 CLINICAL DATA:  69 year old male with shortness of breath. History of COPD. EXAM: CHEST - 2 VIEW COMPARISON:  Chest radiograph dated 06/04/2013. FINDINGS: There is a large left pneumothorax (greater than 20%) measuring approximately 5.5 cm from the lateral pleural surface. Left lung base atelectasis.  There is a background of emphysema. The right lung is clear. No  pleural effusion. No mediastinal shift. Atherosclerotic calcification of the aortic arch. No acute osseous pathology. IMPRESSION: Large left pneumothorax.  No mediastinal shift. These results were called by telephone at the time of interpretation on 03/07/2019 at 8:40 pm to provider [facility] , who verbally acknowledged these results. Electronically Signed   By: Anner Crete M.D.   On: 03/07/2019 20:45   CT Chest W Contrast  Result Date: 03/17/2019 CLINICAL DATA:  Follow-up for left lower lung mass noted on recent chest radiographs. EXAM: CT CHEST WITH CONTRAST TECHNIQUE: Multidetector CT imaging of the chest was performed during intravenous contrast administration. CONTRAST:  60mL OMNIPAQUE IOHEXOL 300 MG/ML  SOLN COMPARISON:  Current chest radiograph, 03/15/2019. Prior chest CT, 09/27/2018 FINDINGS: Cardiovascular: Heart normal in size and configuration. No pericardial effusion. Mild three-vessel coronary artery calcifications. None great vessels normal in caliber. Mild aortic atherosclerosis. Mediastinum/Nodes: Prominent left infrahilar lymph nodes, largest 1 cm short axis, new since the prior CT. No mediastinal or right hilar masses or enlarged lymph nodes. Normal thyroid. No neck base or axillary masses or adenopathy. Trachea and esophagus are unremarkable. Lungs/Pleura: Well-defined oval mass in the left lower lobe abutting the oblique fissure, measuring 2.6 x 2.0 x 2.5 cm. This contains a central oval area high attenuation, mean 170 Hounsfield units, consistent with an enhancing vascular structure. There are additional left lower lobe opacities that are more irregular, 1 centered in the left lower lobe, image 104, series 3, measuring 2.1 x 1.5 x 1.7 cm. There are adjacent additional irregular opacities with hazy and reticular opacities along the margins that extend from the left lower lobe adjacent to the oblique fissure, image 83, inferiorly to the posterior lung base. On the prior exam there was  a focal opacity in the left lower lobe, which corresponds to the opacity noted on image 104, series 3. This opacity is increased in size. Remainder of the lungs is clear. Trace left pleural effusion. Small area of nonspecific pleural thickening noted in the left upper hemithorax centered on image 60, series 2. No pneumothorax. Upper Abdomen: No acute findings.  Gallstones. Musculoskeletal: No fracture or acute finding. No osteoblastic or osteolytic lesions. IMPRESSION: 1. Masslike opacity noted on the current chest radiograph appears to be a combination of a well-defined, 2.5 cm left lower lobe opacity and adjacent more irregular focal opacities. The well-defined 2.5 cm opacity contains a central oval area of high attenuation consistent with an enhancing vascular structure. This could reflect a pseudoaneurysm. It is new since the prior CT. There is no defined feeding or draining vessel. 2. Adjacent irregular focal opacities in the left lower lobe are noted, 1 enlarged compared to the prior CT and the others new. Although neoplastic disease is in the differential diagnosis, these findings are suspected to be inflammatory/infectious in etiology. 3. There is mild associated left infrahilar adenopathy. 4. Remainder of the lungs is clear. 5. Mild coronary artery and aortic atherosclerosis. Aortic Atherosclerosis (ICD10-I70.0). Electronically Signed   By: Lajean Manes M.D.   On: 03/17/2019 12:18   CT ANGIO CHEST PE W OR WO CONTRAST  Result Date: 03/21/2019 CLINICAL DATA:  Evaluate for vascular lesion or pseudoaneurysm. History of left pneumothorax and chest tube. EXAM: CT ANGIOGRAPHY CHEST WITH CONTRAST TECHNIQUE: Multidetector CT imaging of the chest was performed using the standard protocol during bolus administration of intravenous contrast. Multiplanar CT image reconstructions and MIPs were obtained to evaluate the vascular anatomy.  CONTRAST:  34mL ISOVUE-370 IOPAMIDOL (ISOVUE-370) INJECTION 76% COMPARISON:   03/17/2019 FINDINGS: Cardiovascular: There is contrast within the aorta and the pulmonary arteries on this examination. Normal appearance of the pulmonary arteries without filling defects or pulmonary emboli. Mild atherosclerotic disease in the distal aortic arch and descending thoracic aorta without aneurysm or dissection. Ascending thoracic aorta measures up to 3.4 cm. Great vessels are patent. Mild atherosclerotic disease in left subclavian artery. Heart size is normal. No significant pericardial fluid. Mediastinum/Nodes: Normal appearance of the mediastinal structures. Stable fullness or lymphadenopathy in left hilum measuring up to 1.1 cm on sequence 4, image 59. No significant axillary lymph node enlargement. Lungs/Pleura: Trachea and mainstem bronchi are patent. Emphysematous changes noted in the upper lobes. No large pleural effusions. Focal pleural thickening along the left anterolateral chest adjacent to the third rib that is new since 2020 and could be related to prior chest tube.Again noted is a rounded lesion in left lower lobe adjacent to the left major fissure. This lesion no longer contains contrast as a did on the recent examination. Previously, this was concerning for a vascular pseudoaneurysm. Structure now measures 2.1 x 2.4 x 3.6 cm and previously measured 2.4 x 2.4 x 4.1 cm. There continues to be additional small nodular structures in the left lower lobe. Lesion along the posterior left lower lobe on sequence 7, image 108 is a multi lobulated structure measuring 2.2 x 1.1 cm, previously measured 2.1 x 1.7 cm. Lesion is more distinct and has decreased inflammatory changes. Overall, the parenchymal densities and lesions in the left lower lobe have decreased in size. There continues to be dependent densities in left lower lobe which have not significantly changed. No significant pleural fluid. New 4 mm nodule in the left lower lobe on sequence 7, image 88. Upper Abdomen: Multiple calcified  gallstones. Possible cyst involving the posterolateral left kidney. Stable lymph node or nodule just medial to the left adrenal gland. This node measures roughly 1.0 cm in the short axis and measured roughly 0.9 cm on 09/25/2015. Small lymph nodes in the upper abdomen are stable. No acute abnormality in the upper abdomen. Musculoskeletal: No acute bone abnormality. Review of the MIP images confirms the above findings. IMPRESSION: 1. Left lower lobe lesion adjacent to the left major fissure has decreased in size and there is no longer contrast enhancement or vascular flow within this lesion. Based on the previous examination, this lesion was a concerning for pseudoaneurysm. 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 Recommend continued short-term follow-up to ensure persistent thrombosis and regression of this lesion. 2. Scattered patchy and nodular densities throughout the left lower lobe. Overall, the left lower lobe densities have decreased in size. Decreased inflammatory changes in left lower lobe. There may be a new 4 mm nodule. Based on the interval change in these lesions/densities, suspect this represents an infectious or inflammatory process. 3. Left hilar lymphadenopathy. This hilar lymphadenopathy is new since 2020 and could be reactive. Recommend attention to this area on follow up imaging. 4. Pleural thickening near the left third rib. This is new since 2020 and may be related to the recent chest tube. Recommend attention to this area on follow up imaging. 5. Aortic Atherosclerosis (ICD10-I70.0) and Emphysema (ICD10-J43.9). 6. Cholelithiasis. Electronically Signed   By: Markus Daft M.D.   On: 03/21/2019 12:50   DG Chest Port 1 View  Result Date: 03/09/2019 CLINICAL DATA:  Left pneumothorax. EXAM: PORTABLE CHEST 1 VIEW COMPARISON:  Same day. FINDINGS: The heart size and mediastinal contours are within normal limits. Left-sided chest tube is  unchanged in position. No pneumothorax pleural effusion is noted. Right lung is clear. Two rounded densities remain in the left lower lobe which may represent residual inflammation, but nodules cannot be excluded. The visualized skeletal structures are unremarkable. IMPRESSION: Stable position of left-sided chest tube without definite pneumothorax. 2 rounded densities are noted in the left lower lobe which may represent inflammation, but nodules cannot be excluded. CT scan may be performed for further evaluation. Electronically Signed   By: Marijo Conception M.D.   On: 03/09/2019 13:32   DG Chest Port 1 View  Result Date: 03/09/2019 CLINICAL DATA:  Left pneumothorax EXAM: PORTABLE CHEST 1 VIEW COMPARISON:  03/08/2019 FINDINGS: No significant change in AP portable examination. Left-sided chest tube remains in position with no significant residual pneumothorax. No significant change in left basilar airspace opacity. The right lung is normally aerated. Heart and mediastinum are unremarkable. IMPRESSION: No significant change in AP portable examination. Left-sided chest tube remains in position with no significant residual pneumothorax. No significant change in left basilar airspace opacity. Electronically Signed   By: Eddie Candle M.D.   On: 03/09/2019 08:51   On my review of the films the patient had malposition of the chest tube which was then repositioned on subsequent films , the left lower lung mass density first appeared on the film the day following placement of chest tube   I have independently reviewed the above radiology studies  and reviewed the findings with the patient.   Recent Lab Findings: Lab Results  Component Value Date   WBC 7.2 03/10/2019   HGB 11.3 (L) 03/10/2019   HCT 34.0 (L) 03/10/2019   PLT 229 03/10/2019   GLUCOSE 106 (H) 03/10/2019   CHOL 202 (H) 11/16/2017   TRIG 221.0 (H) 11/16/2017   HDL 40.90 11/16/2017   LDLDIRECT 152.0 11/16/2017   LDLCALC 154 (H) 08/31/2015   ALT  27 11/16/2017   AST 21 11/16/2017   NA 139 03/10/2019   K 4.1 03/10/2019   CL 107 03/10/2019   CREATININE 1.19 03/10/2019   BUN 23 03/10/2019   CO2 25 03/10/2019   TSH 1.39 08/31/2015   INR 1.1 (H) 11/16/2017   HGBA1C 6.3 08/31/2015      Assessment / Plan:   #1 formation of left lower lobe mass, with contrast following placement of chest tube on February 1, with holding of anticoagulation, and CT timed to accentuate pulmonary artery vessels the mass continues to decreased in size and no longer appears to have connection with contrast filling.-The time course of this is suspicious that its chest tube placement trauma.  CT scan today shows continued decrease in size of the left lower lobe mass. A   #2 history of atrial fibrillation had been on anticoagulation-the burden of his atrial fibrillation is unknown  #3 waxing and waning pulmonary nodules especially left lower lobe-appeared to have disappeared now   #4 COPD    I reviewed the films with the patient and with continued decrease in size we will continue to follow with CT-repeat CT in 5 months   Patient remains off Xarelto at the current time-in the office today appears to be in sinus rhythm     Grace Isaac MD      [address] [place] [address] Office [phone]     07/28/2019 2:48 PM

## 2019-08-25 DIAGNOSIS — R0602 Shortness of breath: Secondary | ICD-10-CM | POA: Diagnosis not present

## 2019-08-25 DIAGNOSIS — F17200 Nicotine dependence, unspecified, uncomplicated: Secondary | ICD-10-CM | POA: Diagnosis not present

## 2019-08-25 DIAGNOSIS — E782 Mixed hyperlipidemia: Secondary | ICD-10-CM | POA: Diagnosis not present

## 2019-08-25 DIAGNOSIS — I10 Essential (primary) hypertension: Secondary | ICD-10-CM | POA: Diagnosis not present

## 2019-08-25 DIAGNOSIS — I2510 Atherosclerotic heart disease of native coronary artery without angina pectoris: Secondary | ICD-10-CM | POA: Diagnosis not present

## 2019-08-25 DIAGNOSIS — I4891 Unspecified atrial fibrillation: Secondary | ICD-10-CM | POA: Diagnosis not present

## 2019-08-25 DIAGNOSIS — J449 Chronic obstructive pulmonary disease, unspecified: Secondary | ICD-10-CM | POA: Diagnosis not present

## 2019-08-31 DIAGNOSIS — I4891 Unspecified atrial fibrillation: Secondary | ICD-10-CM | POA: Diagnosis not present

## 2019-08-31 DIAGNOSIS — I10 Essential (primary) hypertension: Secondary | ICD-10-CM | POA: Diagnosis not present

## 2019-08-31 DIAGNOSIS — R0602 Shortness of breath: Secondary | ICD-10-CM | POA: Diagnosis not present

## 2019-08-31 DIAGNOSIS — F17200 Nicotine dependence, unspecified, uncomplicated: Secondary | ICD-10-CM | POA: Diagnosis not present

## 2019-08-31 DIAGNOSIS — I2510 Atherosclerotic heart disease of native coronary artery without angina pectoris: Secondary | ICD-10-CM | POA: Diagnosis not present

## 2019-08-31 DIAGNOSIS — J449 Chronic obstructive pulmonary disease, unspecified: Secondary | ICD-10-CM | POA: Diagnosis not present

## 2019-08-31 DIAGNOSIS — E782 Mixed hyperlipidemia: Secondary | ICD-10-CM | POA: Diagnosis not present

## 2019-10-26 DIAGNOSIS — E782 Mixed hyperlipidemia: Secondary | ICD-10-CM | POA: Diagnosis not present

## 2019-10-26 DIAGNOSIS — I10 Essential (primary) hypertension: Secondary | ICD-10-CM | POA: Diagnosis not present

## 2019-10-26 DIAGNOSIS — I4891 Unspecified atrial fibrillation: Secondary | ICD-10-CM | POA: Diagnosis not present

## 2019-10-26 DIAGNOSIS — I340 Nonrheumatic mitral (valve) insufficiency: Secondary | ICD-10-CM | POA: Diagnosis not present

## 2019-10-26 DIAGNOSIS — I2510 Atherosclerotic heart disease of native coronary artery without angina pectoris: Secondary | ICD-10-CM | POA: Diagnosis not present

## 2019-10-27 DIAGNOSIS — Z23 Encounter for immunization: Secondary | ICD-10-CM | POA: Diagnosis not present

## 2019-11-04 ENCOUNTER — Other Ambulatory Visit: Payer: Self-pay | Admitting: Family Medicine

## 2019-11-11 DIAGNOSIS — H401112 Primary open-angle glaucoma, right eye, moderate stage: Secondary | ICD-10-CM | POA: Diagnosis not present

## 2019-11-17 ENCOUNTER — Other Ambulatory Visit: Payer: Self-pay | Admitting: Cardiothoracic Surgery

## 2019-11-17 DIAGNOSIS — R911 Solitary pulmonary nodule: Secondary | ICD-10-CM

## 2019-11-28 DIAGNOSIS — H401112 Primary open-angle glaucoma, right eye, moderate stage: Secondary | ICD-10-CM | POA: Diagnosis not present

## 2020-01-04 NOTE — Progress Notes (Signed)
[address]       [place] [address]             [phone]                    Isham S Bi Minneola Medical Record #[id number] Date of Birth: 04/04/1950  Referring: Nestor Lewandowsky, MD Primary Care: Ria Bush, MD Primary Cardiologist: No primary care provider on file.  Chief Complaint:    Chief Complaint  Patient presents with  . Lung Lesion    6 month f/u with Chest CT    History of Present Illness:    Victor Cortez 69 y.o. male is seen in the office  today for follow CT scan of the chest.  Is a follow-up after hospitalization at [facility] for pneumothorax. He returns today with a follow-up CT scan of the chest.  After arrival in the office the patient was noted to have temperature of 100.2.  With fever, close examination of the patient was not done because of his possibility of Covid infection.  I did review with him the findings of the CT scan of the chest done today.  He was strongly encouraged to get Covid testing done.    He has has a long history of smoking 1-1/2 to 2 packs/day for 50 years, he quit smoking 6 years ago after he was hospitalized for pneumonia.  Since August 2017 he has been enrolled in a lung cancer screening program.  In August 2020 CT scan suggested new left lower lobe lung lesion, however a week later PET scan showed resolution of the lung nodule.    In February 2021 he awoke from sleep with shortness of breath, he went to work but because of persistent left chest discomfort and shortness of breath went to the [facility] emergency room on March 07 2019 .  He had a left pneumothorax on chest x-ray, chest tube was placed in the emergency room, from the patient's history and a series of x-rays there was some difficulty in placing this chest tube and it was repositioned.,  Ultimately his lung reexpanded and the chest tube was removed.    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  After review with interventional radiologist a repeat CT scan was done today specifically with contrast timing to evaluate the pulmonary arteries.  In addition between the 2 scans the patient discontinued his anticoagulation with Xarelto .  He had been on Xarelto for atrial fibrillation first noted 6 years ago associated with his admission for pneumonia.  The patient is unaware of any palpitations or the duration of his atrial fibrillation, in the office today the patient is in sinus rhythm.  He remains off Xarelto    Current Activity/ Functional Status:  Patient is independent with mobility/ambulation, transfers, ADL's, IADL's.   Zubrod Score: At the time of surgery this patient's most appropriate activity status/level should be described as: []     0    Normal activity, no symptoms [x]     1    Restricted in physical strenuous activity but ambulatory, able to do out light work []     2    Ambulatory and capable of self care, unable to do work activities, up and about               >50 % of waking hours                              []   3    Only limited self care, in bed greater than 50% of waking hours []     4    Completely disabled, no self care, confined to bed or chair []     5    Moribund   Past Medical History:  Diagnosis Date  . Aortic atherosclerosis ([place])    by CT  . Arthritis   . Atrial fibrillation with RVR ([place]) 06/2013   during hospitalization  . Basal cell carcinoma of nose 1998  . CAD (coronary artery disease)    by CT  . Cataract   . Cholelithiasis 10/2017   by CT  . COPD (chronic obstructive pulmonary disease) ([place])   . Dysrhythmia   . ED (erectile dysfunction)   . Ex-smoker   . GERD (gastroesophageal reflux disease)   . Glaucoma    Dr. Matilde Sprang  . Glaucoma   . Hepatic steatosis    by CT  . History of chicken pox   . History of pneumonia 06/2013   with sepsis and [facility] hospitalization  . Hyperlipidemia   . Hypertension   . Rosacea    . Seborrheic dermatitis     Past Surgical History:  Procedure Laterality Date  . CYSTOSCOPY WITH URETHRAL DILATATION  12/04/2017   Procedure: CYSTOSCOPY WITH URETHRAL DILATATION;  Surgeon: Billey Co, MD;  Location: [facility] ORS;  Service: Urology;;  . CYSTOSCOPY/URETEROSCOPY/HOLMIUM LASER/STENT PLACEMENT Left 12/04/2017   Procedure: CYSTOSCOPY/URETEROSCOPY/HOLMIUM LASER/STENT PLACEMENT;  Surgeon: Billey Co, MD;  Location: [facility] ORS;  Service: Urology;  Laterality: Left;  . 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 ROTATOR CUFF REPAIR  2016    Family History  Problem Relation Age of Onset  . COPD Father   . Cancer Father        lung (smoker)  . COPD Mother   . CAD Neg Hx   . Stroke Neg Hx   . Bladder Cancer Neg Hx   . Prostate cancer Neg Hx      Social History   Tobacco Use  Smoking Status Former Smoker  . Packs/day: 2.00  . Years: 53.00  . Pack years: 106.00  . Types: Cigarettes  . Quit date: 06/03/2013  . Years since quitting: 6.6  Smokeless Tobacco Never Used    Social History   Substance and Sexual Activity  Alcohol Use No     No Known Allergies  Current Outpatient Medications  Medication Sig Dispense Refill  . aspirin EC 81 MG tablet Take 81 mg by mouth daily. 1 tab by mouth in AM    . AZOPT 1 % ophthalmic suspension Place 1 drop into both eyes 2 (two) times daily.     . calcium carbonate (TUMS) 500 MG chewable tablet Chew 1 tablet (200 mg of elemental calcium total) by mouth daily as needed for indigestion or heartburn.    . COMBIVENT RESPIMAT 20-100 MCG/ACT AERS respimat INHALE 1 PUFF INTO THE LUNGS EVERY 6 HOURS AS NEEDED FOR WHEEZING OR SHORTNESS OF BREATH 4 g 6  . Cyanocobalamin (RA VITAMIN B-12 TR) 1000 MCG TBCR Take 1 tablet by mouth daily. One tab by mouth daily    . Glucosamine-Chondroit-Vit C-Mn  (GLUCOSAMINE 1500 COMPLEX) CAPS Take 1 capsule by mouth daily.    Marland Kitchen losartan (COZAAR) 100 MG tablet Take 100 mg by mouth daily.    . Multiple Vitamin (MULTI-VITAMINS) TABS Take by mouth. 1  tab by mouth daily    . Omega-3 Fatty Acids (FISH OIL) 1000 MG CAPS Take 1 capsule by mouth daily. 1 cap by mouth daily    . omeprazole (PRILOSEC) 40 MG capsule Take 1 capsule (40 mg total) by mouth daily. For 3 weeks then as needed 30 capsule 3  . rosuvastatin (CRESTOR) 40 MG tablet Take 40 mg by mouth daily.    . sotalol (BETAPACE) 80 MG tablet Take 80 mg by mouth 2 (two) times daily. One tab by mouth twice a day    . tadalafil (ADCIRCA/CIALIS) 20 MG tablet Take 1 tablet (20 mg total) by mouth daily as needed for erectile dysfunction. 10 tablet 11   No current facility-administered medications for this visit.      PHYSICAL EXAMINATION: BP 133/74   Pulse 65   Temp 100.2 F (37.9 C) (Skin)   Resp 20   Ht 5\' 6"  (1.676 m)   Wt 159 lb (72.1 kg)   SpO2 94% Comment: RA  BMI 25.66 kg/m  Full exam not done due to the possible risk Covid infection with fever  Diagnostic Studies & Laboratory data:     Recent Radiology Findings:  CT CHEST WO CONTRAST  Result Date: 01/05/2020 CLINICAL DATA:  Evaluate lung nodule.  Former smoker. EXAM: CT CHEST WITHOUT CONTRAST TECHNIQUE: Multidetector CT imaging of the chest was performed following the standard protocol without IV contrast. COMPARISON:  07/28/2019 FINDINGS: Cardiovascular: Heart size appears normal. No pericardial effusion. Aortic atherosclerosis. Coronary artery calcifications. Mediastinum/Nodes: No enlarged mediastinal or axillary lymph nodes. Thyroid gland, trachea, and esophagus demonstrate no significant findings. Lungs/Pleura: Mild to moderate centrilobular and paraseptal emphysema. No pleural effusion, airspace consolidation or atelectasis. Punctate nodule within the posterior left base is unchanged, image 116/8. Perifissural nodule along the oblique  fissure measures 1.0 x 0.5 cm, image 96/8. Previously 1.4 x 1.1 cm. Posterolateral right upper lobe nodule measures 3 mm, image 35/8. Unchanged. New tiny nodule in the posteromedial right upper lobe measures 3 mm, image 33/8. Upper Abdomen: No acute findings identified within the imaged portions of the upper abdomen. Gallstones identified. Aortic atherosclerosis. Musculoskeletal: Thoracic spondylosis. No acute or suspicious osseous findings. IMPRESSION: 1. Continued decrease in size of perifissural nodule along the oblique fissure compatible with a benign abnormality. 2. Stable 4 mm right upper lobe lung nodule and punctate left lung base nodule compatible with a benign process. 3. New tiny nodule in the posteromedial right upper lobe measures 3 mm. Non-contrast chest CT can be considered in 12 months for high risk patients. This recommendation follows the consensus statement: Guidelines for Management of Incidental Pulmonary Nodules Detected on CT Images: From the Fleischner Society 2017; Radiology 2017; 284:228-243. 4. Aortic Atherosclerosis (ICD10-I70.0) and Emphysema (ICD10-J43.9). 5. Coronary artery calcifications noted. 6. Gallstones. Electronically Signed   By: Kerby Moors M.D.   On: 01/05/2020 13:28    I have independently reviewed the above radiology studies  and reviewed the findings with the patient.   CT CHEST WO CONTRAST  Result Date: 07/28/2019 CLINICAL DATA:  Evaluate lung nodule. Former smoker. Shortness of breath. EXAM: CT CHEST WITHOUT CONTRAST TECHNIQUE: Multidetector CT imaging of the chest was performed following the standard protocol without IV contrast. COMPARISON:  04/20/2018 FINDINGS: Cardiovascular: Normal heart size. No pericardial effusion. Aortic atherosclerosis. Three vessel coronary artery calcifications identified. Mediastinum/Nodes: No enlarged mediastinal or axillary lymph nodes. Thyroid gland, trachea, and esophagus demonstrate no significant findings. Lungs/Pleura: No  pleural effusion. Centrilobular emphysema. Index subpleural nodule within the left  midlung abutting the oblique fissure measures 1.4 x 1.1 cm, image 94/8. This is compared with 2.4 x 1.8 cm on 03/21/19. On 04/20/2019 this measured 2.1 x 1.9 cm. The previous 5 mm posterior left upper lobe lung nodule has resolved in the interval. Stable 4 mm posterolateral right upper lobe lung nodule, image 32/8. This is remained unchanged since 09/24/2017 compatible with a benign nodule. Tiny calcified granuloma is noted in the posterior left lower lobe, image 115/8. No new suspicious lung nodule identified bilaterally. Upper Abdomen: No acute abnormality. Musculoskeletal: No chest wall mass or suspicious bone lesions identified. IMPRESSION: 1. Continued interval decrease in size of left midlung subpleural nodule. Previously characterized as a thrombosed pseudoaneurysm. 2. Interval resolution of previous 5 mm posterior left upper lobe lung nodule. 3. Stable 4 mm right upper lobe lung nodule compatible with a benign nodule. 4. Three vessel coronary artery calcifications noted. 5. Emphysema and aortic atherosclerosis. Aortic Atherosclerosis (ICD10-I70.0) and Emphysema (ICD10-J43.9). Electronically Signed   By: Kerby Moors M.D.   On: 07/28/2019 13:20       CT ANGIO CHEST PE W OR WO CONTRAST  Result Date: 04/20/2019 CLINICAL DATA:  Left lung nodule EXAM: CT ANGIOGRAPHY CHEST WITH CONTRAST TECHNIQUE: Multidetector CT imaging of the chest was performed using the standard protocol during bolus administration of intravenous contrast. Multiplanar CT image reconstructions and MIPs were obtained to evaluate the vascular anatomy. CONTRAST:  66mL OMNIPAQUE IOHEXOL 350 MG/ML SOLN COMPARISON:  03/21/2019 FINDINGS: Cardiovascular: Heart is normal size. Aorta is normal caliber. Mild atherosclerotic irregularity in the descending thoracic aorta. No evidence of pulmonary embolus. Mediastinum/Nodes: No mediastinal, hilar, or axillary  adenopathy. Trachea and esophagus are unremarkable. Thyroid unremarkable. Lungs/Pleura: Continued smooth round nodule in the left lower lobe. This measures 2.1 x 2.1 x 1.9 cm compared with 3.6 x 2.4 x 2.1 cm previously. Nodular densities posteriorly in the left lower lobe have further decreased in size and nearly resolved. Residual nodule measures 5 mm. This area measured up to 2.4 cm previously. Residual scarring in the area. No new or enlarging pulmonary nodules. No pleural effusions. Upper Abdomen: Imaging into the upper abdomen shows no acute findings. Gallstones noted within the gallbladder. Musculoskeletal: Chest wall soft tissues are unremarkable. No acute bony abnormality. Review of the MIP images confirms the above findings. IMPRESSION: Smooth round nodule in the left lower lobe further decreased in size, now measuring up to 2.1 cm. This presumably represents further decreased size of a thrombosed pseudoaneurysm. Additional nodular areas posteriorly in the left lower lobe have nearly completely resolved, likely resolving inflammatory process. Residual scarring in the area. No acute cardiopulmonary disease. Aortic Atherosclerosis (ICD10-I70.0). Electronically Signed   By: Rolm Baptise M.D.   On: 04/20/2019 14:48      DG Chest 1 View  Result Date: 03/08/2019 CLINICAL DATA:  Chest tube EXAM: CHEST  1 VIEW COMPARISON:  03/07/2019. FINDINGS: Left chest tube remains in place. No pneumothorax. Mild left lower lobe airspace disease has improved in the interval. Small left effusion has improved. Right lung remains clear. IMPRESSION: Left chest tube in place without pneumothorax Improved aeration left lung base. Electronically Signed   By: Franchot Gallo M.D.   On: 03/08/2019 16:46   DG Chest 2 View  Result Date: 03/16/2019 CLINICAL DATA:  Follow-up left lung pneumothorax EXAM: CHEST - 2 VIEW COMPARISON:  CT September 27, 2018, radiograph 03/10/2019, PET CT October 04, 2018 FINDINGS: No residual or recurrent  left pneumothorax. Masslike opacity is again seen in the  left lower lobe. Blunting of the left costophrenic sulcus may reflect small volume pleural fluid or scarring. Remaining portions of the lungs are clear. The cardiomediastinal contours are unremarkable. No acute osseous or soft tissue abnormality. Degenerative changes are present in the imaged spine and shoulders. IMPRESSION: No residual or recurrent left pneumothorax. Increasing size and attenuation of the left lower lobe masslike opacity concerning for infection or neoplasm. Consider further evaluation with cross-sectional imaging as this has increased in size from most recent cross-sectional comparison studies. Scarring versus trace effusion of the left costophrenic sulcus. Electronically Signed   By: Lovena Le M.D.   On: 03/16/2019 00:03   DG Chest 2 View  Result Date: 03/10/2019 CLINICAL DATA:  Follow-up left pneumothorax after chest tube removal EXAM: CHEST - 2 VIEW COMPARISON:  Chest radiograph from one day prior. FINDINGS: Stable cardiomediastinal silhouette with normal heart size. No pneumothorax. No pleural effusion. Persistent clustered nodular opacities in the lower left lung are unchanged. No pulmonary edema. IMPRESSION: 1. No pneumothorax. 2. Stable clustered nodular opacities in the lower left lung, indeterminate for neoplasm versus infection. Recommend either short-term follow-up chest radiographs or further evaluation with chest CT. Electronically Signed   By: Ilona Sorrel M.D.   On: 03/10/2019 09:23   DG Chest 2 View  Result Date: 03/07/2019 CLINICAL DATA:  69 year old male with shortness of breath. History of COPD. EXAM: CHEST - 2 VIEW COMPARISON:  Chest radiograph dated 06/04/2013. FINDINGS: There is a large left pneumothorax (greater than 20%) measuring approximately 5.5 cm from the lateral pleural surface. Left lung base atelectasis. There is a background of emphysema. The right lung is clear. No pleural effusion. No mediastinal  shift. Atherosclerotic calcification of the aortic arch. No acute osseous pathology. IMPRESSION: Large left pneumothorax.  No mediastinal shift. These results were called by telephone at the time of interpretation on 03/07/2019 at 8:40 pm to provider [facility] , who verbally acknowledged these results. Electronically Signed   By: Anner Crete M.D.   On: 03/07/2019 20:45   CT Chest W Contrast  Result Date: 03/17/2019 CLINICAL DATA:  Follow-up for left lower lung mass noted on recent chest radiographs. EXAM: CT CHEST WITH CONTRAST TECHNIQUE: Multidetector CT imaging of the chest was performed during intravenous contrast administration. CONTRAST:  3mL OMNIPAQUE IOHEXOL 300 MG/ML  SOLN COMPARISON:  Current chest radiograph, 03/15/2019. Prior chest CT, 09/27/2018 FINDINGS: Cardiovascular: Heart normal in size and configuration. No pericardial effusion. Mild three-vessel coronary artery calcifications. None great vessels normal in caliber. Mild aortic atherosclerosis. Mediastinum/Nodes: Prominent left infrahilar lymph nodes, largest 1 cm short axis, new since the prior CT. No mediastinal or right hilar masses or enlarged lymph nodes. Normal thyroid. No neck base or axillary masses or adenopathy. Trachea and esophagus are unremarkable. Lungs/Pleura: Well-defined oval mass in the left lower lobe abutting the oblique fissure, measuring 2.6 x 2.0 x 2.5 cm. This contains a central oval area high attenuation, mean 170 Hounsfield units, consistent with an enhancing vascular structure. There are additional left lower lobe opacities that are more irregular, 1 centered in the left lower lobe, image 104, series 3, measuring 2.1 x 1.5 x 1.7 cm. There are adjacent additional irregular opacities with hazy and reticular opacities along the margins that extend from the left lower lobe adjacent to the oblique fissure, image 83, inferiorly to the posterior lung base. On the prior exam there was a focal opacity in the left  lower lobe, which corresponds to the opacity noted on image 104, series  3. This opacity is increased in size. Remainder of the lungs is clear. Trace left pleural effusion. Small area of nonspecific pleural thickening noted in the left upper hemithorax centered on image 60, series 2. No pneumothorax. Upper Abdomen: No acute findings.  Gallstones. Musculoskeletal: No fracture or acute finding. No osteoblastic or osteolytic lesions. IMPRESSION: 1. Masslike opacity noted on the current chest radiograph appears to be a combination of a well-defined, 2.5 cm left lower lobe opacity and adjacent more irregular focal opacities. The well-defined 2.5 cm opacity contains a central oval area of high attenuation consistent with an enhancing vascular structure. This could reflect a pseudoaneurysm. It is new since the prior CT. There is no defined feeding or draining vessel. 2. Adjacent irregular focal opacities in the left lower lobe are noted, 1 enlarged compared to the prior CT and the others new. Although neoplastic disease is in the differential diagnosis, these findings are suspected to be inflammatory/infectious in etiology. 3. There is mild associated left infrahilar adenopathy. 4. Remainder of the lungs is clear. 5. Mild coronary artery and aortic atherosclerosis. Aortic Atherosclerosis (ICD10-I70.0). Electronically Signed   By: Lajean Manes M.D.   On: 03/17/2019 12:18   CT ANGIO CHEST PE W OR WO CONTRAST  Result Date: 03/21/2019 CLINICAL DATA:  Evaluate for vascular lesion or pseudoaneurysm. History of left pneumothorax and chest tube. EXAM: CT ANGIOGRAPHY CHEST WITH CONTRAST TECHNIQUE: Multidetector CT imaging of the chest was performed using the standard protocol during bolus administration of intravenous contrast. Multiplanar CT image reconstructions and MIPs were obtained to evaluate the vascular anatomy. CONTRAST:  28mL ISOVUE-370 IOPAMIDOL (ISOVUE-370) INJECTION 76% COMPARISON:  03/17/2019 FINDINGS:  Cardiovascular: There is contrast within the aorta and the pulmonary arteries on this examination. Normal appearance of the pulmonary arteries without filling defects or pulmonary emboli. Mild atherosclerotic disease in the distal aortic arch and descending thoracic aorta without aneurysm or dissection. Ascending thoracic aorta measures up to 3.4 cm. Great vessels are patent. Mild atherosclerotic disease in left subclavian artery. Heart size is normal. No significant pericardial fluid. Mediastinum/Nodes: Normal appearance of the mediastinal structures. Stable fullness or lymphadenopathy in left hilum measuring up to 1.1 cm on sequence 4, image 59. No significant axillary lymph node enlargement. Lungs/Pleura: Trachea and mainstem bronchi are patent. Emphysematous changes noted in the upper lobes. No large pleural effusions. Focal pleural thickening along the left anterolateral chest adjacent to the third rib that is new since 2020 and could be related to prior chest tube.Again noted is a rounded lesion in left lower lobe adjacent to the left major fissure. This lesion no longer contains contrast as a did on the recent examination. Previously, this was concerning for a vascular pseudoaneurysm. Structure now measures 2.1 x 2.4 x 3.6 cm and previously measured 2.4 x 2.4 x 4.1 cm. There continues to be additional small nodular structures in the left lower lobe. Lesion along the posterior left lower lobe on sequence 7, image 108 is a multi lobulated structure measuring 2.2 x 1.1 cm, previously measured 2.1 x 1.7 cm. Lesion is more distinct and has decreased inflammatory changes. Overall, the parenchymal densities and lesions in the left lower lobe have decreased in size. There continues to be dependent densities in left lower lobe which have not significantly changed. No significant pleural fluid. New 4 mm nodule in the left lower lobe on sequence 7, image 88. Upper Abdomen: Multiple calcified gallstones. Possible cyst  involving the posterolateral left kidney. Stable lymph node or nodule just medial to  the left adrenal gland. This node measures roughly 1.0 cm in the short axis and measured roughly 0.9 cm on 09/25/2015. Small lymph nodes in the upper abdomen are stable. No acute abnormality in the upper abdomen. Musculoskeletal: No acute bone abnormality. Review of the MIP images confirms the above findings. IMPRESSION: 1. Left lower lobe lesion adjacent to the left major fissure has decreased in size and there is no longer contrast enhancement or vascular flow within this lesion. Based on the previous examination, this lesion was a concerning for pseudoaneurysm. 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 Recommend continued short-term follow-up to ensure persistent thrombosis and regression of this lesion. 2. Scattered patchy and nodular densities throughout the left lower lobe. Overall, the left lower lobe densities have decreased in size. Decreased inflammatory changes in left lower lobe. There may be a new 4 mm nodule. Based on the interval change in these lesions/densities, suspect this represents an infectious or inflammatory process. 3. Left hilar lymphadenopathy. This hilar lymphadenopathy is new since 2020 and could be reactive. Recommend attention to this area on follow up imaging. 4. Pleural thickening near the left third rib. This is new since 2020 and may be related to the recent chest tube. Recommend attention to this area on follow up imaging. 5. Aortic Atherosclerosis (ICD10-I70.0) and Emphysema (ICD10-J43.9). 6. Cholelithiasis. Electronically Signed   By: Markus Daft M.D.   On: 03/21/2019 12:50   DG Chest Port 1 View  Result Date: 03/09/2019 CLINICAL DATA:  Left pneumothorax. EXAM: PORTABLE CHEST 1 VIEW COMPARISON:  Same day. FINDINGS: The heart size and mediastinal contours are within normal limits. Left-sided chest tube is unchanged in position. No  pneumothorax pleural effusion is noted. Right lung is clear. Two rounded densities remain in the left lower lobe which may represent residual inflammation, but nodules cannot be excluded. The visualized skeletal structures are unremarkable. IMPRESSION: Stable position of left-sided chest tube without definite pneumothorax. 2 rounded densities are noted in the left lower lobe which may represent inflammation, but nodules cannot be excluded. CT scan may be performed for further evaluation. Electronically Signed   By: Marijo Conception M.D.   On: 03/09/2019 13:32   DG Chest Port 1 View  Result Date: 03/09/2019 CLINICAL DATA:  Left pneumothorax EXAM: PORTABLE CHEST 1 VIEW COMPARISON:  03/08/2019 FINDINGS: No significant change in AP portable examination. Left-sided chest tube remains in position with no significant residual pneumothorax. No significant change in left basilar airspace opacity. The right lung is normally aerated. Heart and mediastinum are unremarkable. IMPRESSION: No significant change in AP portable examination. Left-sided chest tube remains in position with no significant residual pneumothorax. No significant change in left basilar airspace opacity. Electronically Signed   By: Eddie Candle M.D.   On: 03/09/2019 08:51   On my review of the films the patient had malposition of the chest tube which was then repositioned on subsequent films , the left lower lung mass density first appeared on the film the day following placement of chest tube   I have independently reviewed the above radiology studies  and reviewed the findings with the patient.   Recent Lab Findings: Lab Results  Component Value Date   WBC 7.2 03/10/2019   HGB 11.3 (L) 03/10/2019   HCT 34.0 (L) 03/10/2019   PLT 229 03/10/2019   GLUCOSE 106 (H) 03/10/2019   CHOL 202 (H) 11/16/2017   TRIG 221.0 (H) 11/16/2017   HDL 40.90 11/16/2017  LDLDIRECT 152.0 11/16/2017   LDLCALC 154 (H) 08/31/2015   ALT 27 11/16/2017   AST 21  11/16/2017   NA 139 03/10/2019   K 4.1 03/10/2019   CL 107 03/10/2019   CREATININE 1.19 03/10/2019   BUN 23 03/10/2019   CO2 25 03/10/2019   TSH 1.39 08/31/2015   INR 1.1 (H) 11/16/2017   HGBA1C 6.3 08/31/2015      Assessment / Plan:   #1 formation of left lower lobe mass, with contrast following placement of chest tube on March 07 2019 , with holding of anticoagulation, and CT timed to accentuate pulmonary artery vessels the mass continues to decreased in size and no longer appears to have connection with contrast filling.-The time course of this is suspicious that its chest tube placement trauma.  CT scan today shows continued decrease in size of the left lower lobe mass. -   #2 low-grade temperature-possible Covid infection-patient has been vaccinated x2-we will strongly recommended to the patient to go and be immediately tested for Covid.  #3 waxing and waning pulmonary nodules especially left lower lobe-appeared to have disappeared now   #4 COPD    I reviewed the films with the patient .  The area in question of left lower lobe has decreased in size sufficiently that no surgical intervention needs to be entertained at this time.  The patient was originally referred by Dr. Faith Rogue and the lung cancer screening program at [facility] will refer the patient back to the lung cancer screening program at [facility] for repeat CT in 1 year and continued follow-up       Grace Isaac MD      [address] [place] [address] Office [phone]     01/09/2020 12:44 PM

## 2020-01-05 ENCOUNTER — Ambulatory Visit (INDEPENDENT_AMBULATORY_CARE_PROVIDER_SITE_OTHER): Payer: Medicare Other | Admitting: Cardiothoracic Surgery

## 2020-01-05 ENCOUNTER — Ambulatory Visit
Admission: RE | Admit: 2020-01-05 | Discharge: 2020-01-05 | Disposition: A | Discharge status: Home / Self Care | Payer: Medicare Other | Source: Ambulatory Visit | Attending: Cardiothoracic Surgery | Admitting: Cardiothoracic Surgery

## 2020-01-05 ENCOUNTER — Other Ambulatory Visit: Payer: Self-pay

## 2020-01-05 VITALS — BP 133/74 | HR 65 | Temp 100.2°F | Resp 20 | Ht 66.0 in | Wt 159.0 lb

## 2020-01-05 DIAGNOSIS — R911 Solitary pulmonary nodule: Secondary | ICD-10-CM

## 2020-01-05 DIAGNOSIS — J432 Centrilobular emphysema: Secondary | ICD-10-CM | POA: Diagnosis not present

## 2020-01-05 DIAGNOSIS — I2510 Atherosclerotic heart disease of native coronary artery without angina pectoris: Secondary | ICD-10-CM | POA: Diagnosis not present

## 2020-01-05 DIAGNOSIS — I700 Atherosclerosis of aorta: Secondary | ICD-10-CM | POA: Diagnosis not present

## 2020-01-05 DIAGNOSIS — I708 Atherosclerosis of other arteries: Secondary | ICD-10-CM

## 2020-01-05 DIAGNOSIS — M47814 Spondylosis without myelopathy or radiculopathy, thoracic region: Secondary | ICD-10-CM | POA: Diagnosis not present

## 2020-01-09 ENCOUNTER — Encounter: Payer: Self-pay | Admitting: Urology

## 2020-01-09 ENCOUNTER — Other Ambulatory Visit: Payer: Self-pay

## 2020-01-09 ENCOUNTER — Ambulatory Visit
Admission: RE | Admit: 2020-01-09 | Discharge: 2020-01-09 | Disposition: A | Discharge status: Home / Self Care | Payer: Medicare Other | Attending: Urology | Admitting: Urology

## 2020-01-09 ENCOUNTER — Ambulatory Visit
Admission: RE | Admit: 2020-01-09 | Discharge: 2020-01-09 | Disposition: A | Discharge status: Home / Self Care | Payer: Medicare Other | Source: Ambulatory Visit | Attending: Urology | Admitting: Urology

## 2020-01-09 ENCOUNTER — Ambulatory Visit (INDEPENDENT_AMBULATORY_CARE_PROVIDER_SITE_OTHER): Payer: Medicare Other | Admitting: Urology

## 2020-01-09 VITALS — BP 138/67 | HR 68 | Ht 66.0 in | Wt 159.0 lb

## 2020-01-09 DIAGNOSIS — N529 Male erectile dysfunction, unspecified: Secondary | ICD-10-CM

## 2020-01-09 DIAGNOSIS — N35911 Unspecified urethral stricture, male, meatal: Secondary | ICD-10-CM | POA: Diagnosis not present

## 2020-01-09 DIAGNOSIS — N2889 Other specified disorders of kidney and ureter: Secondary | ICD-10-CM | POA: Diagnosis not present

## 2020-01-09 DIAGNOSIS — N200 Calculus of kidney: Secondary | ICD-10-CM | POA: Diagnosis not present

## 2020-01-09 MED ORDER — SILDENAFIL CITRATE 20 MG PO TABS: 20.0000 mg | ORAL_TABLET | Freq: Three times a day (TID) | ORAL | 6 refills | Status: DC

## 2020-01-09 NOTE — Patient Instructions (Addendum)
Dietary Guidelines to Help Prevent Kidney Stones Kidney stones are deposits of minerals and salts that form inside your kidneys. Your risk of developing kidney stones may be greater depending on your diet, your lifestyle, the medicines you take, and whether you have certain medical conditions. Most people can reduce their chances of developing kidney stones by following the instructions below. Depending on your overall health and the type of kidney stones you tend to develop, your dietitian may give you more specific instructions. What are tips for following this plan? Reading food labels  Choose foods with "no salt added" or "low-salt" labels. Limit your sodium intake to less than 1500 mg per day.  Choose foods with calcium for each meal and snack. Try to eat about 300 mg of calcium at each meal. Foods that contain 200-500 mg of calcium per serving include: ? 8 oz (237 ml) of milk, fortified nondairy milk, and fortified fruit juice. ? 8 oz (237 ml) of kefir, yogurt, and soy yogurt. ? 4 oz (118 ml) of tofu. ? 1 oz of cheese. ? 1 cup (300 g) of dried figs. ? 1 cup (91 g) of cooked broccoli. ? 1-3 oz can of sardines or mackerel.  Most people need 1000 to 1500 mg of calcium each day. Talk to your dietitian about how much calcium is recommended for you. Shopping  Buy plenty of fresh fruits and vegetables. Most people do not need to avoid fruits and vegetables, even if they contain nutrients that may contribute to kidney stones.  When shopping for convenience foods, choose: ? Whole pieces of fruit. ? Premade salads with dressing on the side. ? Low-fat fruit and yogurt smoothies.  Avoid buying frozen meals or prepared deli foods.  Look for foods with live cultures, such as yogurt and kefir. Cooking  Do not add salt to food when cooking. Place a salt shaker on the table and allow each person to add his or her own salt to taste.  Use vegetable protein, such as beans, textured vegetable  protein (TVP), or tofu instead of meat in pasta, casseroles, and soups. Meal planning   Eat less salt, if told by your dietitian. To do this: ? Avoid eating processed or premade food. ? Avoid eating fast food.  Eat less animal protein, including cheese, meat, poultry, or fish, if told by your dietitian. To do this: ? Limit the number of times you have meat, poultry, fish, or cheese each week. Eat a diet free of meat at least 2 days a week. ? Eat only one serving each day of meat, poultry, fish, or seafood. ? When you prepare animal protein, cut pieces into small portion sizes. For most meat and fish, one serving is about the size of one deck of cards.  Eat at least 5 servings of fresh fruits and vegetables each day. To do this: ? Keep fruits and vegetables on hand for snacks. ? Eat 1 piece of fruit or a handful of berries with breakfast. ? Have a salad and fruit at lunch. ? Have two kinds of vegetables at dinner.  Limit foods that are high in a substance called oxalate. These include: ? Spinach. ? Rhubarb. ? Beets. ? Potato chips and french fries. ? Nuts.  If you regularly take a diuretic medicine, make sure to eat at least 1-2 fruits or vegetables high in potassium each day. These include: ? Avocado. ? Banana. ? Orange, prune, carrot, or tomato juice. ? Baked potato. ? Cabbage. ? Beans and split   peas. General instructions   Drink enough fluid to keep your urine clear or pale yellow. This is the most important thing you can do.  Talk to your health care provider and dietitian about taking daily supplements. Depending on your health and the cause of your kidney stones, you may be advised: ? Not to take supplements with vitamin C. ? To take a calcium supplement. ? To take a daily probiotic supplement. ? To take other supplements such as magnesium, fish oil, or vitamin B6.  Take all medicines and supplements as told by your health care provider.  Limit alcohol intake to no  more than 1 drink a day for nonpregnant women and 2 drinks a day for men. One drink equals 12 oz of beer, 5 oz of wine, or 1 oz of hard liquor.  Lose weight if told by your health care provider. Work with your dietitian to find strategies and an eating plan that works best for you. What foods are not recommended? Limit your intake of the following foods, or as told by your dietitian. Talk to your dietitian about specific foods you should avoid based on the type of kidney stones and your overall health. Grains Breads. Bagels. Rolls. Baked goods. Salted crackers. Cereal. Pasta. Vegetables Spinach. Rhubarb. Beets. Canned vegetables. Angie Fava. Olives. Meats and other protein foods Nuts. Nut butters. Large portions of meat, poultry, or fish. Salted or cured meats. Deli meats. Hot dogs. Sausages. Dairy Cheese. Beverages Regular soft drinks. Regular vegetable juice. Seasonings and other foods Seasoning blends with salt. Salad dressings. Canned soups. Soy sauce. Ketchup. Barbecue sauce. Canned pasta sauce. Casseroles. Pizza. Lasagna. Frozen meals. Potato chips. [place] fries. Summary  You can reduce your risk of kidney stones by making changes to your diet.  The most important thing you can do is drink enough fluid. You should drink enough fluid to keep your urine clear or pale yellow.  Ask your health care provider or dietitian how much protein from animal sources you should eat each day, and also how much salt and calcium you should have each day. This information is not intended to replace advice given to you by your health care provider. Make sure you discuss any questions you have with your health care provider. Document Revised: 05/12/2018 Document Reviewed: 01/01/2016 Elsevier Patient Education  [place]. Sildenafil tablets (Erectile Dysfunction) What is this medicine? SILDENAFIL (sil DEN a fil) is used to treat erection problems in men. This medicine may be used for other  purposes; ask your health care provider or pharmacist if you have questions. COMMON BRAND NAME(S): Viagra What should I tell my health care provider before I take this medicine? They need to know if you have any of these conditions:  bleeding disorders  eye or vision problems, including a rare inherited eye disease called retinitis pigmentosa  anatomical deformation of the penis, Peyronie's disease, or history of priapism (painful and prolonged erection)  heart disease, angina, a history of heart attack, irregular heart beats, or other heart problems  high or low blood pressure  history of blood diseases, like sickle cell anemia or leukemia  history of stomach bleeding  kidney disease  liver disease  stroke  an unusual or allergic reaction to sildenafil, other medicines, foods, dyes, or preservatives  pregnant or trying to get pregnant  breast-feeding How should I use this medicine? Take this medicine by mouth with a glass of water. Follow the directions on the prescription label. The dose is usually taken 1 hour  before sexual activity. You should not take the dose more than once per day. Do not take your medicine more often than directed. Talk to your pediatrician regarding the use of this medicine in children. This medicine is not used in children for this condition. Overdosage: If you think you have taken too much of this medicine contact a poison control center or emergency room at once. NOTE: This medicine is only for you. Do not share this medicine with others. What if I miss a dose? This does not apply. Do not take double or extra doses. What may interact with this medicine? Do not take this medicine with any of the following medications:  cisapride  nitrates like amyl nitrite, isosorbide dinitrate, isosorbide mononitrate, nitroglycerin  riociguat This medicine may also interact with the following medications:  antiviral medicines for HIV or AIDS  bosentan   certain medicines for benign prostatic hyperplasia (BPH)  certain medicines for blood pressure  certain medicines for fungal infections like ketoconazole and itraconazole  cimetidine  erythromycin  rifampin This list may not describe all possible interactions. Give your health care provider a list of all the medicines, herbs, non-prescription drugs, or dietary supplements you use. Also tell them if you smoke, drink alcohol, or use illegal drugs. Some items may interact with your medicine. What should I watch for while using this medicine? If you notice any changes in your vision while taking this drug, call your doctor or health care professional as soon as possible. Stop using this medicine and call your health care provider right away if you have a loss of sight in one or both eyes. Contact your doctor or health care professional right away if you have an erection that lasts longer than 4 hours or if it becomes painful. This may be a sign of a serious problem and must be treated right away to prevent permanent damage. If you experience symptoms of nausea, dizziness, chest pain or arm pain upon initiation of sexual activity after taking this medicine, you should refrain from further activity and call your doctor or health care professional as soon as possible. Do not drink alcohol to excess (examples, 5 glasses of wine or 5 shots of whiskey) when taking this medicine. When taken in excess, alcohol can increase your chances of getting a headache or getting dizzy, increasing your heart rate or lowering your blood pressure. Using this medicine does not protect you or your partner against HIV infection (the virus that causes AIDS) or other sexually transmitted diseases. What side effects may I notice from receiving this medicine? Side effects that you should report to your doctor or health care professional as soon as possible:  allergic reactions like skin rash, itching or hives, swelling of the  face, lips, or tongue  breathing problems  changes in hearing  changes in vision  chest pain  fast, irregular heartbeat  prolonged or painful erection  seizures Side effects that usually do not require medical attention (report to your doctor or health care professional if they continue or are bothersome):  back pain  dizziness  flushing  headache  indigestion  muscle aches  nausea  stuffy or runny nose This list may not describe all possible side effects. Call your doctor for medical advice about side effects. You may report side effects to FDA at 1-800-FDA-1088. Where should I keep my medicine? Keep out of reach of children. Store at room temperature between 15 and 30 degrees C (59 and 86 degrees F). Throw away  any unused medicine after the expiration date. NOTE: This sheet is a summary. It may not cover all possible information. If you have questions about this medicine, talk to your doctor, pharmacist, or health care provider.  2020 Elsevier/Gold Standard (2015-01-03 12:00:25)

## 2020-01-09 NOTE — Progress Notes (Signed)
   01/09/2020 3:20 PM   Norman Clay 1950/03/16 [id number]  Reason for visit: Follow up nephrolithiasis, fossa navicularis stricture, ED  HPI: Mr. 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  He said no stone episodes since that time, and a follow-up renal ultrasound was benign.  He has a small right lower pole stone that has been stable on surveillance.  I personally viewed and interpreted his KUB today that shows a stable small right lower pole stone.  Regarding erections, he previously was on 20 mg of Cialis that was not working particularly well, and at our last visit he was okay not pursuing alternative ED treatments.  He would like to consider trying a different medication for erections at this time.  We discussed the risks and benefits of sildenafil, and a prescription for 60 to 100 mg on demand was given.  He denies any significant urinary symptoms at this time aside from some mild weak stream, and denies any significant leakage, urgency, or dysuria.  We discussed general stone prevention strategies including adequate hydration with goal of producing 2.5 L of urine daily, increasing citric acid intake, increasing calcium intake during high oxalate meals, minimizing animal protein, and decreasing salt intake. Information about dietary recommendations given today.    -Trial of Revatio for ED -He would like to follow-up on an as-needed basis, and happy to see him in the future for recurrent stones, urinary symptoms or difficulty with erections   Billey Co, MD  [facility] [address] [place], [place] [address] [phone]

## 2020-01-25 DIAGNOSIS — J449 Chronic obstructive pulmonary disease, unspecified: Secondary | ICD-10-CM | POA: Diagnosis not present

## 2020-01-25 DIAGNOSIS — I10 Essential (primary) hypertension: Secondary | ICD-10-CM | POA: Diagnosis not present

## 2020-01-25 DIAGNOSIS — I4891 Unspecified atrial fibrillation: Secondary | ICD-10-CM | POA: Diagnosis not present

## 2020-01-25 DIAGNOSIS — E785 Hyperlipidemia, unspecified: Secondary | ICD-10-CM | POA: Diagnosis not present

## 2020-01-25 DIAGNOSIS — I2510 Atherosclerotic heart disease of native coronary artery without angina pectoris: Secondary | ICD-10-CM | POA: Diagnosis not present

## 2020-01-25 DIAGNOSIS — I340 Nonrheumatic mitral (valve) insufficiency: Secondary | ICD-10-CM | POA: Diagnosis not present

## 2020-02-27 ENCOUNTER — Telehealth: Payer: Self-pay | Admitting: *Deleted

## 2020-02-27 DIAGNOSIS — U071 COVID-19: Secondary | ICD-10-CM

## 2020-02-27 NOTE — Telephone Encounter (Signed)
Lvm asking pt to call back.  Need to get answers to Dr. G's questions and relay his message.  

## 2020-02-27 NOTE — Telephone Encounter (Signed)
Glad he's having mild symptoms.  plz confirm first day of symptoms was 02/25/2020.  Has he received covid vaccines and which?  Let me know above responses and I will send his info over to outpatient COVID treatment team to see if he would qualify for any other treatments.   Let us know right away if any worsening shortness of breath or persistent productive cough or fever.   Follow these current CDC guidelines for self-isolation: - Stay home for 5 days, starting the day after your symptoms (The first day is really day 0). - If you have no symptoms or your symptoms are resolving after 5 days, you can leave your house. - Continue to wear a mask around others for 5 additional days. **If you have a fever, continue to stay home until your fever resolves for 24 hours without fever-reducing medications.**  Here are some of the supportive therapies that may help with symptoms of a COVID-19 infection: A. Zinc Lozenge 75 to 100mg  daily B. Vitamin C 500mg  twice a day C. Vitamin D (Cholecalciferol) 2000 IU daily

## 2020-02-27 NOTE — Telephone Encounter (Signed)
Patient called stating that he started with a cough a couple of days ago. Patient stated that he works at [employer] and decided to get tested for covid. Patient stated that he had a rapid test done at [facility] this morning and it was positive. Patient stated that he feels pretty good other than a non-productive cough. Patient denies a fever, body aches or headache.. Patient stated that he has COPD and does have some SOB from that and his inhalers help with his COPD. Patient stated the SOB that he is having now is not any different than what he normally has. Patient stated that his employer wanted him to call his doctor to see if there was anything that he could be given to speed his recovery up. Patient was given ER precautions and he verbalized understanding. Patient was advise to quarantine, rest, drink plenty of fluids and eat well balanced meals. Advised patient that a message will go back to his PCP to make him aware of his positive covid results.

## 2020-02-28 NOTE — Telephone Encounter (Signed)
Lvm asking pt to call back.  Need to get answers to Dr. G's questions and relay his message.  

## 2020-02-29 NOTE — Telephone Encounter (Signed)
Lvm asking pt to call back.  Need to get answers to Dr. Synthia Innocent questions and relay his message.   Mailed a letter.

## 2020-03-19 DIAGNOSIS — Z23 Encounter for immunization: Secondary | ICD-10-CM | POA: Diagnosis not present

## 2020-04-27 DIAGNOSIS — I2510 Atherosclerotic heart disease of native coronary artery without angina pectoris: Secondary | ICD-10-CM | POA: Diagnosis not present

## 2020-05-02 DIAGNOSIS — I480 Paroxysmal atrial fibrillation: Secondary | ICD-10-CM | POA: Diagnosis not present

## 2020-05-02 DIAGNOSIS — E785 Hyperlipidemia, unspecified: Secondary | ICD-10-CM | POA: Diagnosis not present

## 2020-05-02 DIAGNOSIS — I2510 Atherosclerotic heart disease of native coronary artery without angina pectoris: Secondary | ICD-10-CM | POA: Diagnosis not present

## 2020-05-02 DIAGNOSIS — I10 Essential (primary) hypertension: Secondary | ICD-10-CM | POA: Diagnosis not present

## 2020-05-02 DIAGNOSIS — J449 Chronic obstructive pulmonary disease, unspecified: Secondary | ICD-10-CM | POA: Diagnosis not present

## 2020-05-09 ENCOUNTER — Other Ambulatory Visit: Payer: Self-pay | Admitting: Thoracic Surgery (Cardiothoracic Vascular Surgery)

## 2020-05-09 DIAGNOSIS — R911 Solitary pulmonary nodule: Secondary | ICD-10-CM

## 2020-05-28 DIAGNOSIS — H401112 Primary open-angle glaucoma, right eye, moderate stage: Secondary | ICD-10-CM | POA: Diagnosis not present

## 2020-06-19 DIAGNOSIS — H2512 Age-related nuclear cataract, left eye: Secondary | ICD-10-CM | POA: Diagnosis not present

## 2020-06-22 ENCOUNTER — Other Ambulatory Visit: Payer: Self-pay

## 2020-06-22 ENCOUNTER — Ambulatory Visit
Admission: RE | Admit: 2020-06-22 | Discharge: 2020-06-22 | Disposition: A | Discharge status: Home / Self Care | Payer: Medicare Other | Source: Ambulatory Visit | Attending: Thoracic Surgery (Cardiothoracic Vascular Surgery) | Admitting: Thoracic Surgery (Cardiothoracic Vascular Surgery)

## 2020-06-22 ENCOUNTER — Ambulatory Visit (INDEPENDENT_AMBULATORY_CARE_PROVIDER_SITE_OTHER): Payer: Medicare Other | Admitting: Thoracic Surgery (Cardiothoracic Vascular Surgery)

## 2020-06-22 ENCOUNTER — Encounter: Payer: Self-pay | Admitting: Thoracic Surgery (Cardiothoracic Vascular Surgery)

## 2020-06-22 VITALS — BP 148/94 | HR 66 | Temp 99.0°F | Resp 20 | Ht 66.0 in | Wt 158.0 lb

## 2020-06-22 DIAGNOSIS — R911 Solitary pulmonary nodule: Secondary | ICD-10-CM

## 2020-06-22 DIAGNOSIS — R918 Other nonspecific abnormal finding of lung field: Secondary | ICD-10-CM | POA: Diagnosis not present

## 2020-06-22 NOTE — Progress Notes (Signed)
[address]       [place] [address]             [phone]                    Victor Cortez [place] Medical Record #[id number] Date of Birth: 1951/01/19  Referring: Nestor Lewandowsky, MD Primary Care: Ria Bush, MD Primary Cardiologist: None  Chief Complaint:    Chief Complaint  Patient presents with  . Lung Lesion    6 month f/u of lung nodule. CT Chest today    History of Present Illness:    Victor Cortez 70 y.o. male who presents for follow-up.  He has a history of a pneumothorax that was treated with tube thoracostomy, and scattered pulmonary nodules.  He was last seen by Dr. Servando Snare in December 2021.  Today he has no complaints from a respiratory standpoint.    Past Medical History:  Diagnosis Date  . Aortic atherosclerosis ([place])    by CT  . Arthritis   . Atrial fibrillation with RVR ([place]) 06/2013   during hospitalization  . Basal cell carcinoma of nose 1998  . CAD (coronary artery disease)    by CT  . Cataract   . Cholelithiasis 10/2017   by CT  . COPD (chronic obstructive pulmonary disease) ([place])   . Dysrhythmia   . ED (erectile dysfunction)   . Ex-smoker   . GERD (gastroesophageal reflux disease)   . Glaucoma    Dr. Matilde Sprang  . Glaucoma   . Hepatic steatosis    by CT  . History of chicken pox   . History of pneumonia 06/2013   with sepsis and [facility] hospitalization  . Hyperlipidemia   . Hypertension   . Rosacea   . Seborrheic dermatitis     Past Surgical History:  Procedure Laterality Date  . CYSTOSCOPY WITH URETHRAL DILATATION  12/04/2017   Procedure: CYSTOSCOPY WITH URETHRAL DILATATION;  Surgeon: Billey Co, MD;  Location: [facility] ORS;  Service: Urology;;  . CYSTOSCOPY/URETEROSCOPY/HOLMIUM LASER/STENT PLACEMENT Left 12/04/2017   Procedure: CYSTOSCOPY/URETEROSCOPY/HOLMIUM LASER/STENT PLACEMENT;  Surgeon: Billey Co, MD;  Location: [facility] ORS;  Service: Urology;  Laterality: Left;  . 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 ROTATOR CUFF REPAIR  2016    Family History  Problem Relation Age of Onset  . COPD Father   . Cancer Father        lung (smoker)  . COPD Mother   . CAD Neg Hx   . Stroke Neg Hx   . Bladder Cancer Neg Hx   . Prostate cancer Neg Hx      Social History   Tobacco Use  Smoking Status Former Smoker  . Packs/day: 2.00  . Years: 53.00  . Pack years: 106.00  . Types: Cigarettes  . Quit date: 06/03/2013  . Years since quitting: 7.0  Smokeless Tobacco Never Used    Social History   Substance and Sexual Activity  Alcohol Use No     No Known Allergies  Current Outpatient Medications  Medication Sig Dispense Refill  . aspirin EC 81 MG tablet Take 81 mg by mouth daily. 1 tab by mouth in AM    . AZOPT 1 % ophthalmic suspension Place 1 drop into both eyes 2 (two) times daily.     . calcium  carbonate (TUMS) 500 MG chewable tablet Chew 1 tablet (200 mg of elemental calcium total) by mouth daily as needed for indigestion or heartburn.    . COMBIVENT RESPIMAT 20-100 MCG/ACT AERS respimat INHALE 1 PUFF INTO THE LUNGS EVERY 6 HOURS AS NEEDED FOR WHEEZING OR SHORTNESS OF BREATH 4 g 6  . Cyanocobalamin 1000 MCG TBCR Take 1 tablet by mouth daily. One tab by mouth daily    . Glucosamine-Chondroit-Vit C-Mn (GLUCOSAMINE 1500 COMPLEX) CAPS Take 1 capsule by mouth daily.    Marland Kitchen losartan (COZAAR) 100 MG tablet Take 100 mg by mouth daily.    Marland Kitchen LUMIGAN 0.01 % SOLN     . Multiple Vitamin (MULTI-VITAMINS) TABS Take by mouth. 1 tab by mouth daily    . Omega-3 Fatty Acids (FISH OIL) 1000 MG CAPS Take 1 capsule by mouth daily. 1 cap by mouth daily    . omeprazole (PRILOSEC) 40 MG capsule Take 1 capsule (40 mg total) by mouth daily. For 3 weeks then as needed 30 capsule 3  . rosuvastatin (CRESTOR) 40 MG tablet Take 40 mg by mouth daily.    . sildenafil  (REVATIO) 20 MG tablet Take 1 tablet (20 mg total) by mouth 3 (three) times daily. 3-5 PRN 30 min prior to intercourse 30 tablet 6  . sotalol (BETAPACE) 80 MG tablet Take 80 mg by mouth 2 (two) times daily. One tab by mouth twice a day    . tadalafil (ADCIRCA/CIALIS) 20 MG tablet Take 1 tablet (20 mg total) by mouth daily as needed for erectile dysfunction. 10 tablet 11   No current facility-administered medications for this visit.    Review of Systems  All other systems reviewed and are negative.   PHYSICAL EXAMINATION: BP (!) 148/94 (BP Location: Right Arm, Patient Position: Sitting)   Pulse 66   Temp 99 F (37.2 C)   Resp 20   Ht 5\' 6"  (1.676 m)   Wt 158 lb (71.7 kg)   SpO2 95% Comment: RA  BMI 25.50 kg/m   Physical Exam Constitutional:      Appearance: Normal appearance. He is normal weight.  Cardiovascular:     Rate and Rhythm: Normal rate.  Pulmonary:     Effort: Pulmonary effort is normal. No respiratory distress.  Musculoskeletal:     Cervical back: Normal range of motion.  Neurological:     Mental Status: He is alert.      Diagnostic Studies & Laboratory data:     Recent Radiology Findings:   CT Chest Wo Contrast  Result Date: 06/22/2020 CLINICAL DATA:  Right upper lobe pulmonary nodule. EXAM: CT CHEST WITHOUT CONTRAST TECHNIQUE: Multidetector CT imaging of the chest was performed following the standard protocol without IV contrast. COMPARISON:  01/05/2020 FINDINGS: Cardiovascular: The heart size is normal. No substantial pericardial effusion. Coronary artery calcification is evident. Atherosclerotic calcification is noted in the wall of the thoracic aorta. Mediastinum/Nodes: No mediastinal lymphadenopathy. No evidence for gross hilar lymphadenopathy although assessment is limited by the lack of intravenous contrast on today's study. The esophagus has normal imaging features. There is no axillary lymphadenopathy. Lungs/Pleura: Centrilobular and paraseptal emphysema  evident. 3 mm right upper lobe pulmonary nodule on 34/8 is stable in the interval. A second posterior right upper lobe nodule measured previously at 3 mm has resolved. A new 8 x 4 mm paraspinal right upper lobe nodule evident on 42/8 today. There is a new 5 mm right middle lobe nodule on 113/8. 9 x 4 mm left lower  lobe perifissural nodule on 94/a was 10 x 6 mm previously. New small focus of tree-in-bud nodularity periphery in the right lower lobe (130/8) suggests atypical infection. No focal airspace consolidation. There is no evidence of pleural effusion. Upper Abdomen: Calcified gallstones again noted. Musculoskeletal: No worrisome lytic or sclerotic osseous abnormality. IMPRESSION: 1. Interval development of several new right-sided pulmonary nodules measuring up to 8 mm. Non-contrast chest CT at 3-6 months is recommended. If the nodules are stable at time of repeat CT, then future CT at 18-24 months (from today's scan) is considered optional for low-risk patients, but is recommended for high-risk patients. This recommendation follows the consensus statement: Guidelines for Management of Incidental Pulmonary Nodules Detected on CT Images: From the Fleischner Society 2017; Radiology 2017; 284:228-243. 2. New small focus of tree-in-bud nodularity posterior right lower lobe suggests atypical infection. 3. Cholelithiasis. 4. Aortic Atherosclerosis (ICD10-I70.0) and Emphysema (ICD10-J43.9). Electronically Signed   By: Misty Stanley M.D.   On: 06/22/2020 09:51       I have independently reviewed the above radiology studies  and reviewed the findings with the patient.   Recent Lab Findings: Lab Results  Component Value Date   WBC 7.2 03/10/2019   HGB 11.3 (L) 03/10/2019   HCT 34.0 (L) 03/10/2019   PLT 229 03/10/2019   GLUCOSE 106 (H) 03/10/2019   CHOL 202 (H) 11/16/2017   TRIG 221.0 (H) 11/16/2017   HDL 40.90 11/16/2017   LDLDIRECT 152.0 11/16/2017   LDLCALC 154 (H) 08/31/2015   ALT 27 11/16/2017    AST 21 11/16/2017   NA 139 03/10/2019   K 4.1 03/10/2019   CL 107 03/10/2019   CREATININE 1.19 03/10/2019   BUN 23 03/10/2019   CO2 25 03/10/2019   TSH 1.39 08/31/2015   INR 1.1 (H) 11/16/2017   HGBA1C 6.3 08/31/2015        Assessment / Plan:   69 year old male with history of spontaneous pneumothorax now has 2 new pulmonary nodules on the right side.  Cross-sectional imaging was reviewed.  He does have an extensive smoking history, and there is evidence of emphysema CT scan.  I will repeat CT chest in 3 months.  I will also refer him to Dr. Valeta Harms for further evaluation.      Lajuana Matte 06/22/2020 2:39 PM

## 2020-06-26 ENCOUNTER — Encounter: Payer: Self-pay | Admitting: Pulmonary Disease

## 2020-06-28 DIAGNOSIS — H401112 Primary open-angle glaucoma, right eye, moderate stage: Secondary | ICD-10-CM | POA: Diagnosis not present

## 2020-07-04 ENCOUNTER — Encounter: Payer: Self-pay | Admitting: Ophthalmology

## 2020-07-04 DIAGNOSIS — Z9841 Cataract extraction status, right eye: Secondary | ICD-10-CM

## 2020-07-04 DIAGNOSIS — Z9842 Cataract extraction status, left eye: Secondary | ICD-10-CM

## 2020-07-04 HISTORY — DX: Cataract extraction status, right eye: Z98.41

## 2020-07-04 HISTORY — DX: Cataract extraction status, left eye: Z98.42

## 2020-07-05 NOTE — Discharge Instructions (Signed)

## 2020-07-07 NOTE — Anesthesia Preprocedure Evaluation (Addendum)
Anesthesia Evaluation  Patient identified by MRN, date of birth, ID band Patient awake    Reviewed: Allergy & Precautions, NPO status , Patient's Chart, lab work & pertinent test results  History of Anesthesia Complications Negative for: history of anesthetic complications  Airway Mallampati: I   Neck ROM: Full    Dental  (+) Upper Dentures, Partial Lower   Pulmonary COPD, former smoker (quit 2015),  Hx PTX 03/2019   Pulmonary exam normal breath sounds clear to auscultation       Cardiovascular hypertension, + CAD  Normal cardiovascular exam+ dysrhythmias (a fib)  Rhythm:Regular Rate:Normal     Neuro/Psych negative neurological ROS     GI/Hepatic GERD  ,  Endo/Other  negative endocrine ROS  Renal/GU Renal disease (nephrolithiasis)     Musculoskeletal  (+) Arthritis ,   Abdominal   Peds  Hematology Skin BCC   Anesthesia Other Findings   Reproductive/Obstetrics                            Anesthesia Physical Anesthesia Plan  ASA: III  Anesthesia Plan: MAC   Post-op Pain Management:    Induction: Intravenous  PONV Risk Score and Plan: 1 and TIVA, Midazolam and Treatment may vary due to age or medical condition  Airway Management Planned: Nasal Cannula  Additional Equipment:   Intra-op Plan:   Post-operative Plan:   Informed Consent: I have reviewed the patients History and Physical, chart, labs and discussed the procedure including the risks, benefits and alternatives for the proposed anesthesia with the patient or authorized representative who has indicated his/her understanding and acceptance.       Plan Discussed with: CRNA  Anesthesia Plan Comments:        Anesthesia Quick Evaluation

## 2020-07-10 ENCOUNTER — Ambulatory Visit
Admission: RE | Admit: 2020-07-10 | Discharge: 2020-07-10 | Disposition: A | Discharge status: Home / Self Care | Payer: Medicare Other | Attending: Ophthalmology | Admitting: Ophthalmology

## 2020-07-10 ENCOUNTER — Ambulatory Visit: Payer: Medicare Other | Admitting: Anesthesiology

## 2020-07-10 ENCOUNTER — Other Ambulatory Visit: Payer: Self-pay

## 2020-07-10 ENCOUNTER — Encounter
Admission: RE | Disposition: A | Discharge status: Home / Self Care | Payer: Self-pay | Source: Home / Self Care | Attending: Ophthalmology

## 2020-07-10 ENCOUNTER — Encounter: Payer: Self-pay | Admitting: Ophthalmology

## 2020-07-10 DIAGNOSIS — Z87891 Personal history of nicotine dependence: Secondary | ICD-10-CM | POA: Diagnosis not present

## 2020-07-10 DIAGNOSIS — H2512 Age-related nuclear cataract, left eye: Secondary | ICD-10-CM | POA: Insufficient documentation

## 2020-07-10 DIAGNOSIS — Z7982 Long term (current) use of aspirin: Secondary | ICD-10-CM | POA: Insufficient documentation

## 2020-07-10 DIAGNOSIS — Z85828 Personal history of other malignant neoplasm of skin: Secondary | ICD-10-CM | POA: Insufficient documentation

## 2020-07-10 DIAGNOSIS — Z79899 Other long term (current) drug therapy: Secondary | ICD-10-CM | POA: Diagnosis not present

## 2020-07-10 DIAGNOSIS — H25812 Combined forms of age-related cataract, left eye: Secondary | ICD-10-CM | POA: Diagnosis not present

## 2020-07-10 HISTORY — PX: CATARACT EXTRACTION W/PHACO: SHX586

## 2020-07-10 HISTORY — DX: Presence of dental prosthetic device (complete) (partial): Z97.2

## 2020-07-10 SURGERY — PHACOEMULSIFICATION, CATARACT, WITH IOL INSERTION
Anesthesia: Monitor Anesthesia Care | Site: Eye | Laterality: Left

## 2020-07-10 MED ORDER — BRIMONIDINE TARTRATE-TIMOLOL 0.2-0.5 % OP SOLN
OPHTHALMIC | Status: DC | PRN
  Administered 2020-07-10: 1 [drp] via OPHTHALMIC

## 2020-07-10 MED ORDER — ONDANSETRON HCL 4 MG/2ML IJ SOLN: 4.0000 mg | Freq: Once | INTRAMUSCULAR | Status: DC | PRN

## 2020-07-10 MED ORDER — FENTANYL CITRATE (PF) 100 MCG/2ML IJ SOLN
INTRAMUSCULAR | Status: DC | PRN
  Administered 2020-07-10: 50 ug via INTRAVENOUS

## 2020-07-10 MED ORDER — LACTATED RINGERS IV SOLN: INTRAVENOUS | Status: DC

## 2020-07-10 MED ORDER — NA CHONDROIT SULF-NA HYALURON 40-17 MG/ML IO SOLN
INTRAOCULAR | Status: DC | PRN
  Administered 2020-07-10: 1 mL via INTRAOCULAR

## 2020-07-10 MED ORDER — ARMC OPHTHALMIC DILATING DROPS
1.0000 "application " | OPHTHALMIC | Status: DC | PRN
  Administered 2020-07-10 (×3): 1 via OPHTHALMIC

## 2020-07-10 MED ORDER — ACETAMINOPHEN 160 MG/5ML PO SOLN: 325.0000 mg | ORAL | Status: DC | PRN

## 2020-07-10 MED ORDER — TETRACAINE HCL 0.5 % OP SOLN
1.0000 [drp] | OPHTHALMIC | Status: DC | PRN
  Administered 2020-07-10 (×3): 1 [drp] via OPHTHALMIC

## 2020-07-10 MED ORDER — MIDAZOLAM HCL 2 MG/2ML IJ SOLN
INTRAMUSCULAR | Status: DC | PRN
  Administered 2020-07-10: 1 mg via INTRAVENOUS

## 2020-07-10 MED ORDER — ACETAMINOPHEN 325 MG PO TABS: 650.0000 mg | ORAL_TABLET | Freq: Once | ORAL | Status: DC | PRN

## 2020-07-10 MED ORDER — EPINEPHRINE PF 1 MG/ML IJ SOLN
INTRAOCULAR | Status: DC | PRN
  Administered 2020-07-10: 66 mL via OPHTHALMIC

## 2020-07-10 MED ORDER — LIDOCAINE HCL (PF) 2 % IJ SOLN
INTRAOCULAR | Status: DC | PRN
  Administered 2020-07-10: 1 mL

## 2020-07-10 MED ORDER — MOXIFLOXACIN HCL 0.5 % OP SOLN
OPHTHALMIC | Status: DC | PRN
  Administered 2020-07-10: 0.2 mL via OPHTHALMIC

## 2020-07-10 SURGICAL SUPPLY — 17 items
CANNULA ANT/CHMB 27G (MISCELLANEOUS) ×2 IMPLANT
CANNULA ANT/CHMB 27GA (MISCELLANEOUS) ×6 IMPLANT
GLOVE SURG TRIUMPH 8.0 PF LTX (GLOVE) ×3 IMPLANT
GOWN STRL REUS W/ TWL LRG LVL3 (GOWN DISPOSABLE) ×2 IMPLANT
GOWN STRL REUS W/TWL LRG LVL3 (GOWN DISPOSABLE) ×6
LENS IOL TECNIS EYHANCE 18.5 (Intraocular Lens) ×2 IMPLANT
MARKER SKIN DUAL TIP RULER LAB (MISCELLANEOUS) ×3 IMPLANT
NDL FILTER BLUNT 18X1 1/2 (NEEDLE) ×1 IMPLANT
NEEDLE FILTER BLUNT 18X 1/2SAF (NEEDLE) ×2
NEEDLE FILTER BLUNT 18X1 1/2 (NEEDLE) ×1 IMPLANT
PACK EYE AFTER SURG (MISCELLANEOUS) ×3 IMPLANT
PACK OPTHALMIC (MISCELLANEOUS) ×3 IMPLANT
PACK PORFILIO (MISCELLANEOUS) ×3 IMPLANT
SYR 3ML LL SCALE MARK (SYRINGE) ×3 IMPLANT
SYR TB 1ML LUER SLIP (SYRINGE) ×3 IMPLANT
WATER STERILE IRR 250ML POUR (IV SOLUTION) ×3 IMPLANT
WIPE NON LINTING 3.25X3.25 (MISCELLANEOUS) ×3 IMPLANT

## 2020-07-10 NOTE — Op Note (Signed)
PREOPERATIVE DIAGNOSIS:  Nuclear sclerotic cataract of the left eye.   POSTOPERATIVE DIAGNOSIS:  Nuclear sclerotic cataract of the left eye.   OPERATIVE PROCEDURE:@   SURGEON:  Birder Robson, MD.   ANESTHESIA:  Anesthesiologist: Darrin Nipper, MD CRNA: Mayme Genta, CRNA  1.      Managed anesthesia care. 2.     0.27ml of Shugarcaine was instilled following the paracentesis   COMPLICATIONS:  None.   TECHNIQUE:   Stop and chop   DESCRIPTION OF PROCEDURE:  The patient was examined and consented in the preoperative holding area where the aforementioned topical anesthesia was applied to the left eye and then brought back to the Operating Room where the left eye was prepped and draped in the usual sterile ophthalmic fashion and a lid speculum was placed. A paracentesis was created with the side port blade and the anterior chamber was filled with viscoelastic. A near clear corneal incision was performed with the steel keratome. A continuous curvilinear capsulorrhexis was performed with a cystotome followed by the capsulorrhexis forceps. Hydrodissection and hydrodelineation were carried out with BSS on a blunt cannula. The lens was removed in a stop and chop  technique and the remaining cortical material was removed with the irrigation-aspiration handpiece. The capsular bag was inflated with viscoelastic and the Technis [id number] lens was placed in the capsular bag without complication. The remaining viscoelastic was removed from the eye with the irrigation-aspiration handpiece. The wounds were hydrated. The anterior chamber was flushed with BSS and the eye was inflated to physiologic pressure. 0.62ml Vigamox was placed in the anterior chamber. The wounds were found to be water tight. The eye was dressed with Combigan. The patient was given protective glasses to wear throughout the day and a shield with which to sleep tonight. The patient was also given drops with which to begin a drop regimen today and  will follow-up with me in one day. Implant Name Type Inv. Item Serial No. Manufacturer Lot No. LRB No. Used Action  LENS IOL TECNIS EYHANCE 18.5 - [id number] Intraocular Lens LENS IOL TECNIS EYHANCE 18.5 [id number] JOHNSON   Left 1 Implanted    Procedure(s): CATARACT EXTRACTION PHACO AND INTRAOCULAR LENS PLACEMENT (IOC) LEFT VIVITY TORIC 9.00 00:51.4 (Left)  Electronically signed: Birder Robson 07/10/2020 1:37 PM

## 2020-07-10 NOTE — Anesthesia Procedure Notes (Signed)
Procedure Name: MAC Performed by: Japji Kok, CRNA Pre-anesthesia Checklist: Patient identified, Emergency Drugs available, Suction available, Timeout performed and Patient being monitored Patient Re-evaluated:Patient Re-evaluated prior to induction Oxygen Delivery Method: Nasal cannula Placement Confirmation: positive ETCO2       

## 2020-07-10 NOTE — Transfer of Care (Signed)
Immediate Anesthesia Transfer of Care Note  Patient: Victor Cortez  Procedure(s) Performed: CATARACT EXTRACTION PHACO AND INTRAOCULAR LENS PLACEMENT (IOC) LEFT VIVITY TORIC 9.00 00:51.4 (Left Eye)  Patient Location: PACU  Anesthesia Type: MAC  Level of Consciousness: awake, alert  and patient cooperative  Airway and Oxygen Therapy: Patient Spontanous Breathing and Patient connected to supplemental oxygen  Post-op Assessment: Post-op Vital signs reviewed, Patient's Cardiovascular Status Stable, Respiratory Function Stable, Patent Airway and No signs of Nausea or vomiting  Post-op Vital Signs: Reviewed and stable  Complications: No complications documented.

## 2020-07-10 NOTE — Anesthesia Postprocedure Evaluation (Signed)
Anesthesia Post Note  Patient: Victor Cortez  Procedure(s) Performed: CATARACT EXTRACTION PHACO AND INTRAOCULAR LENS PLACEMENT (IOC) LEFT VIVITY TORIC 9.00 00:51.4 (Left Eye)     Patient location during evaluation: PACU Anesthesia Type: MAC Level of consciousness: awake and alert, oriented and patient cooperative Pain management: pain level controlled Vital Signs Assessment: post-procedure vital signs reviewed and stable Respiratory status: spontaneous breathing, nonlabored ventilation and respiratory function stable Cardiovascular status: blood pressure returned to baseline and stable Postop Assessment: adequate PO intake Anesthetic complications: no   No complications documented.  Darrin Nipper

## 2020-07-10 NOTE — H&P (Signed)
[facility]   Primary Care Physician:  Ria Bush, MD Ophthalmologist: Dr. George Ina  Pre-Procedure History & Physical: HPI:  Victor Cortez is a 70 y.o. male here for cataract surgery.   Past Medical History:  Diagnosis Date  . Aortic atherosclerosis ([place])    by CT  . Arthritis   . Atrial fibrillation with RVR ([place]) 06/2013   during hospitalization  . Basal cell carcinoma of nose 1998  . CAD (coronary artery disease)    by CT  . Cataract   . Cholelithiasis 10/2017   by CT  . COPD (chronic obstructive pulmonary disease) ([place])   . Dysrhythmia   . ED (erectile dysfunction)   . Ex-smoker   . GERD (gastroesophageal reflux disease)   . Glaucoma    Dr. Matilde Sprang  . Glaucoma   . Hepatic steatosis    by CT  . History of chicken pox   . History of pneumonia 06/2013   with sepsis and [facility] hospitalization  . Hyperlipidemia   . Hypertension   . Rosacea   . Seborrheic dermatitis   . Wears dentures    full upper, partial lower    Past Surgical History:  Procedure Laterality Date  . CYSTOSCOPY WITH URETHRAL DILATATION  12/04/2017   Procedure: CYSTOSCOPY WITH URETHRAL DILATATION;  Surgeon: Billey Co, MD;  Location: [facility] ORS;  Service: Urology;;  . CYSTOSCOPY/URETEROSCOPY/HOLMIUM LASER/STENT PLACEMENT Left 12/04/2017   Procedure: CYSTOSCOPY/URETEROSCOPY/HOLMIUM LASER/STENT PLACEMENT;  Surgeon: Billey Co, MD;  Location: [facility] ORS;  Service: Urology;  Laterality: Left;  . 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 ROTATOR CUFF REPAIR  2016    Prior to Admission medications   Medication Sig Start Date End Date Taking? Authorizing Provider  aspirin EC 81 MG tablet Take 81 mg by mouth daily. 1 tab by mouth in AM   Yes [provider]  AZOPT 1 % ophthalmic suspension Place 1 drop into both eyes 2 (two) times daily.  (Brinzolamide) 08/16/18  Yes [provider]  calcium carbonate (TUMS) 500 MG chewable tablet Chew 1 tablet (200 mg of elemental calcium total) by mouth daily as needed for indigestion or heartburn. 02/28/19  Yes Ria Bush, MD  Cholecalciferol (VITAMIN D3) 10 MCG (400 UNIT) CAPS Take by mouth daily.   Yes [provider]  COMBIVENT RESPIMAT 20-100 MCG/ACT AERS respimat INHALE 1 PUFF INTO THE LUNGS EVERY 6 HOURS AS NEEDED FOR WHEEZING OR SHORTNESS OF BREATH 11/07/19  Yes Ria Bush, MD  losartan (COZAAR) 100 MG tablet Take 100 mg by mouth daily.   Yes [provider]  LUMIGAN 0.01 % SOLN  11/28/19  Yes [provider]  Multiple Vitamin (MULTI-VITAMINS) TABS Take by mouth. 1 tab by mouth daily   Yes [provider]  Netarsudil Dimesylate (RHOPRESSA OP) Apply to eye daily.   Yes [provider]  Omega-3 Fatty Acids (FISH OIL) 1000 MG CAPS Take 1 capsule by mouth daily. 1 cap by mouth daily   Yes [provider]  rosuvastatin (CRESTOR) 40 MG tablet Take 40 mg by mouth daily. 10/25/18  Yes [provider]  sotalol (BETAPACE) 80 MG tablet Take 80 mg by mouth 2 (two) times daily. One tab by mouth twice a day 06/07/13  Yes [provider]  Cyanocobalamin 1000 MCG TBCR Take 1 tablet by mouth daily. One tab by mouth daily Patient not  taking: Reported on 07/04/2020    [provider]  Glucosamine-Chondroit-Vit C-Mn (GLUCOSAMINE 1500 COMPLEX) CAPS Take 1 capsule by mouth daily. Patient not taking: Reported on 07/04/2020    [provider]  omeprazole (PRILOSEC) 40 MG capsule Take 1 capsule (40 mg total) by mouth daily. For 3 weeks then as needed 02/28/19   Ria Bush, MD  sildenafil (REVATIO) 20 MG tablet Take 1 tablet (20 mg total) by mouth 3 (three) times daily. 3-5 PRN 30 min prior to intercourse Patient not taking: Reported on 07/04/2020 01/09/20   Billey Co, MD  tadalafil (ADCIRCA/CIALIS) 20  MG tablet Take 1 tablet (20 mg total) by mouth daily as needed for erectile dysfunction. Patient not taking: Reported on 07/04/2020 01/14/18   Billey Co, MD    Allergies as of 05/29/2020  . (No Known Allergies)    Family History  Problem Relation Age of Onset  . COPD Father   . Cancer Father        lung (smoker)  . COPD Mother   . CAD Neg Hx   . Stroke Neg Hx   . Bladder Cancer Neg Hx   . Prostate cancer Neg Hx     Social History   Socioeconomic History  . Marital status: Married    Spouse name: Not on file  . Number of children: 1  . Years of education: Not on file  . Highest education level: Not on file  Occupational History  . Not on file  Tobacco Use  . Smoking status: Former Smoker    Packs/day: 2.00    Years: 53.00    Pack years: 106.00    Types: Cigarettes    Quit date: 06/03/2013    Years since quitting: 7.1  . Smokeless tobacco: Never Used  Vaping Use  . Vaping Use: Never used  Substance and Sexual Activity  . Alcohol use: No  . Drug use: No  . Sexual activity: Not on file  Other Topics Concern  . 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[place], now part time [employer]   Activity: rides bicycle   Diet: some water, fruits/vegetables daily   Social Determinants of Radio broadcast assistant Strain: Not on file  Food Insecurity: Not on file  Transportation Needs: Not on file  Physical Activity: Not on file  Stress: Not on file  Social Connections: Not on file  Intimate Partner Violence: Not on file    Review of Systems: See HPI, otherwise negative ROS  Physical Exam: BP (!) 156/80   Pulse (!) 59   Temp 98 F (36.7 C) (Temporal)   Resp 20   Ht 5\' 6"  (1.676 m)   Wt 69.1 kg   SpO2 95%   BMI 24.60 kg/m  General:   Alert,  pleasant and cooperative in NAD Head:  Normocephalic and atraumatic. Respiratory:  Normal  work of breathing. Cardiovascular:  RRR  Impression/Plan: Victor Cortez is here for cataract surgery.  Risks, benefits, limitations, and alternatives regarding cataract surgery have been reviewed with the patient.  Questions have been answered.  All parties agreeable.   Birder Robson, MD  07/10/2020, 1:03 PM

## 2020-07-11 ENCOUNTER — Encounter: Payer: Self-pay | Admitting: Ophthalmology

## 2020-07-12 ENCOUNTER — Encounter: Payer: Self-pay | Admitting: Ophthalmology

## 2020-07-16 DIAGNOSIS — H2511 Age-related nuclear cataract, right eye: Secondary | ICD-10-CM | POA: Diagnosis not present

## 2020-07-19 ENCOUNTER — Ambulatory Visit (INDEPENDENT_AMBULATORY_CARE_PROVIDER_SITE_OTHER): Payer: Medicare Other | Admitting: Pulmonary Disease

## 2020-07-19 ENCOUNTER — Encounter: Payer: Self-pay | Admitting: Pulmonary Disease

## 2020-07-19 ENCOUNTER — Other Ambulatory Visit: Payer: Self-pay

## 2020-07-19 VITALS — BP 126/78 | HR 66 | Temp 98.7°F | Wt 157.1 lb

## 2020-07-19 DIAGNOSIS — J449 Chronic obstructive pulmonary disease, unspecified: Secondary | ICD-10-CM | POA: Diagnosis not present

## 2020-07-19 DIAGNOSIS — J939 Pneumothorax, unspecified: Secondary | ICD-10-CM | POA: Diagnosis not present

## 2020-07-19 DIAGNOSIS — J432 Centrilobular emphysema: Secondary | ICD-10-CM | POA: Diagnosis not present

## 2020-07-19 DIAGNOSIS — R911 Solitary pulmonary nodule: Secondary | ICD-10-CM

## 2020-07-19 MED ORDER — ALBUTEROL SULFATE HFA 108 (90 BASE) MCG/ACT IN AERS: 2.0000 | INHALATION_SPRAY | Freq: Four times a day (QID) | RESPIRATORY_TRACT | 6 refills | Status: DC | PRN

## 2020-07-19 MED ORDER — TIOTROPIUM BROMIDE-OLODATEROL 2.5-2.5 MCG/ACT IN AERS: 2.0000 | INHALATION_SPRAY | Freq: Every day | RESPIRATORY_TRACT | 0 refills | Status: DC

## 2020-07-19 NOTE — Addendum Note (Signed)
Addended by: Amado Coe on: 07/19/2020 02:14 PM   Modules accepted: Orders

## 2020-07-19 NOTE — Progress Notes (Signed)
Synopsis: Referred in June 2022 for abnormal CT chest pulmonary nodule, by Lajuana Matte, MD  Subjective:   PATIENT ID: Victor Cortez: male DOB: Apr 24, 1950, MRN: [id number]  Chief Complaint  Patient presents with   Consult    This is a 70 year old gentleman, past medical history of atrial fibrillation, coronary artery disease, reflux, hypertension.Patient was initially seen by Dr. Kipp Brood by cardiothoracic surgery on 06/22/2020.  Office note reviewed.  Patient had history of pneumothorax treated with tube thoracostomy.  This was by Dr. Servando Snare in December 2021.  Follow-up CT imaging that was completed on 06/22/2020 revealed a new 8 mm right upper lobe pulmonary nodule.  He does have an extensive smoking history.  Prior diagnosis of COPD.  Has no pulmonary function tests on file.  Patient quit smoking in 2015.  Approximately 106-pack-year history.   OV 07/19/2020: COPD currently managed with Combivent as needed.  He does have CT evidence of emphysema.  Has albuterol to use as needed as well.  He does need refills of medications today.  From a COPD standpoint he does have occasional cough and dyspnea on exertion.  Able to complete most activities of daily living.  He did state that he felt short of breath walking from the parking lot to the office today but the heat outside is making it worse.   Past Medical History:  Diagnosis Date   Aortic atherosclerosis ([place])    by CT   Arthritis    Atrial fibrillation with RVR ([place]) 06/2013   during hospitalization   Basal cell carcinoma of nose 1998   CAD (coronary artery disease)    by CT   Cataract    Cholelithiasis 10/2017   by CT   COPD (chronic obstructive pulmonary disease) ([place])    Dysrhythmia    ED (erectile dysfunction)    Ex-smoker    GERD (gastroesophageal reflux disease)    Glaucoma    Dr. Matilde Sprang   Glaucoma    Hepatic steatosis    by CT   History of chicken pox    History of pneumonia 06/2013   with sepsis and  [facility] hospitalization   Hyperlipidemia    Hypertension    Rosacea    Seborrheic dermatitis    Wears dentures    full upper, partial lower     Family History  Problem Relation Age of Onset   COPD Father    Cancer Father        lung (smoker)   COPD Mother    CAD Neg Hx    Stroke Neg Hx    Bladder Cancer Neg Hx    Prostate cancer Neg Hx      Past Surgical History:  Procedure Laterality Date   CATARACT EXTRACTION W/PHACO Left 07/10/2020   Procedure: CATARACT EXTRACTION PHACO AND INTRAOCULAR LENS PLACEMENT ([place]) LEFT VIVITY TORIC 9.00 00:51.4;  Surgeon: Birder Robson, MD;  Location: [facility];  Service: Ophthalmology;  Laterality: Left;   CYSTOSCOPY WITH URETHRAL DILATATION  12/04/2017   Procedure: CYSTOSCOPY WITH URETHRAL DILATATION;  Surgeon: Billey Co, MD;  Location: [facility];  Service: Urology;;   CYSTOSCOPY/URETEROSCOPY/HOLMIUM LASER/STENT PLACEMENT Left 12/04/2017   Procedure: CYSTOSCOPY/URETEROSCOPY/HOLMIUM LASER/STENT PLACEMENT;  Surgeon: Billey Co, MD;  Location: [facility];  Service: Urology;  Laterality: Left;   ESOPHAGOGASTRODUODENOSCOPY (EGD) WITH PROPOFOL N/A 12/21/2018   normal esophagus, normal stomach, peptic duodenitis - done for 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 [name], MD)   ROTATOR CUFF REPAIR  2016  Social History   Socioeconomic History   Marital status: Married    Spouse name: Not on file   Number of children: 1   Years of education: Not on file   Highest education level: Not on file  Occupational History   Not on file  Tobacco Use   Smoking status: Former    Packs/day: 2.00    Years: 53.00    Pack years: 106.00    Types: Cigarettes    Quit date: 06/03/2013    Years since quitting: 7.1   Smokeless tobacco: Never  Vaping Use   Vaping Use: Never used  Substance and Sexual Activity   Alcohol use: No   Drug use: No   Sexual activity: Not on file  Other Topics Concern   Not on file  Social History Narrative    Retired   Lives with wife and son, 2 dogs, bird, fish (wife died 02/28/20)   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 1 tab by mouth in AM     AZOPT 1 % ophthalmic suspension Place 1 drop into both eyes 2 (two) times daily. (Brinzolamide)     calcium carbonate (TUMS) 500 MG chewable tablet Chew 1 tablet (200 mg of elemental calcium total) by mouth daily as needed for indigestion or heartburn.     Cholecalciferol (VITAMIN D3) 10 MCG (400 UNIT) CAPS Take by mouth daily.     COMBIVENT RESPIMAT 20-100 MCG/ACT AERS respimat INHALE 1 PUFF INTO THE LUNGS EVERY 6 HOURS AS NEEDED FOR WHEEZING OR SHORTNESS OF BREATH 4 g 6   Cyanocobalamin 1000 MCG TBCR Take 1 tablet by mouth daily. One tab by mouth daily     Glucosamine-Chondroit-Vit C-Mn (GLUCOSAMINE 1500 COMPLEX) CAPS Take 1 capsule by mouth daily.     losartan (COZAAR) 100 MG tablet Take 100 mg by mouth daily.     LUMIGAN 0.01 % SOLN      Multiple Vitamin (MULTI-VITAMINS) TABS Take by mouth. 1 tab by mouth daily     Netarsudil Dimesylate (RHOPRESSA OP) Apply to eye daily.     Omega-3 Fatty Acids (FISH OIL) 1000 MG CAPS Take 1 capsule by mouth daily. 1 cap by mouth daily     omeprazole (PRILOSEC) 40 MG capsule Take 1 capsule (40 mg total) by mouth daily. For 3 weeks then as needed 30 capsule 3   rosuvastatin (CRESTOR) 40 MG tablet Take 40 mg by mouth daily.      sildenafil (REVATIO) 20 MG tablet Take 1 tablet (20 mg total) by mouth 3 (three) times daily. 3-5 PRN 30 min prior to intercourse 30 tablet 6   sotalol (BETAPACE) 80 MG tablet Take 80 mg by mouth 2 (two) times daily. One tab by mouth twice a day     tadalafil (ADCIRCA/CIALIS) 20 MG tablet Take 1 tablet (20 mg total) by  mouth daily as needed for erectile dysfunction. 10 tablet 11   RHOPRESSA 0.02 % SOLN APPLY 1 DROP INTO RIGHT EYE EVERY MORNING.     No facility-administered medications prior to visit.    Review of Systems  Constitutional:  Negative for chills, fever, malaise/fatigue and weight loss.  HENT:  Negative for hearing loss, sore throat and tinnitus.   Eyes:  Negative for blurred vision and double vision.  Respiratory:  Positive for shortness of breath. Negative for cough, hemoptysis, sputum production, wheezing and stridor.   Cardiovascular:  Negative for chest pain, palpitations, orthopnea, leg swelling and PND.  Gastrointestinal:  Negative for abdominal pain, constipation, diarrhea, heartburn, nausea and vomiting.  Genitourinary:  Negative for dysuria, hematuria and urgency.  Musculoskeletal:  Negative for joint pain and myalgias.  Skin:  Negative for itching and rash.  Neurological:  Negative for dizziness, tingling, weakness and headaches.  Endo/Heme/Allergies:  Negative for environmental allergies. Does not bruise/bleed easily.  Psychiatric/Behavioral:  Negative for depression. The patient is not nervous/anxious and does not have insomnia.   All other systems reviewed and are negative.   Objective:  Physical Exam Vitals reviewed.  Constitutional:      General: He is not in acute distress.    Appearance: He is well-developed.  HENT:     Head: Normocephalic and atraumatic.  Eyes:     General: No scleral icterus.    Conjunctiva/sclera: Conjunctivae normal.     Pupils: Pupils are equal, round, and reactive to light.  Neck:     Vascular: No JVD.     Trachea: No tracheal  deviation.  Cardiovascular:     Rate and Rhythm: Normal rate and regular rhythm.     Heart sounds: Normal heart sounds. No murmur heard. Pulmonary:     Effort: Pulmonary effort is normal. No tachypnea, accessory muscle usage or respiratory distress.     Breath sounds: No stridor. No wheezing, rhonchi or rales.     Comments: Diminished breath sounds bilaterally Abdominal:     General: Bowel sounds are normal. There is no distension.     Palpations: Abdomen is soft.     Tenderness: There is no abdominal tenderness.  Musculoskeletal:        General: No tenderness.     Cervical back: Neck supple.  Lymphadenopathy:     Cervical: No cervical adenopathy.  Skin:    General: Skin is warm and dry.     Capillary Refill: Capillary refill takes less than 2 seconds.     Findings: No rash.  Neurological:     Mental Status: He is alert and oriented to person, place, and time.  Psychiatric:        Behavior: Behavior normal.     Vitals:   07/19/20 1335  BP: 126/78  Pulse: 66  Temp: 98.7 F (37.1 C)  TempSrc: Temporal  SpO2: 96%  Weight: 157 lb 2 oz (71.3 kg)   96% on RA BMI Readings from Last 3 Encounters:  07/19/20 25.36 kg/m  07/10/20 24.60 kg/m  06/22/20 25.50 kg/m   Wt Readings from Last 3 Encounters:  07/19/20 157 lb 2 oz (71.3 kg)  07/10/20 152 lb 6.4 oz (69.1 kg)  06/22/20 158 lb (71.7 kg)     CBC    Component Value Date/Time   WBC 7.2 03/10/2019 0628   RBC 3.89 (L) 03/10/2019 0628   HGB 11.3 (L) 03/10/2019 0628   HGB 12.6 (L) 06/07/2013 0756   HGB 13.1 06/07/2013 0000   HCT 34.0 (  L) 03/10/2019 0628   HCT 38.8 (L) 06/07/2013 0756   PLT 229 03/10/2019 0628   PLT 249 06/07/2013 0756   MCV 87.4 03/10/2019 0628   MCV 87 06/07/2013 0756   MCH 29.0 03/10/2019 0628   MCHC 33.2 03/10/2019 0628   RDW 13.5 03/10/2019 0628   RDW 14.5 06/07/2013 0756   LYMPHSABS 2.4 11/16/2017 1538   LYMPHSABS 3.8 (H) 06/07/2013 0756   MONOABS 0.8 11/16/2017 1538   MONOABS 0.7  06/07/2013 0756   EOSABS 0.3 11/16/2017 1538   EOSABS 0.0 06/07/2013 0756   BASOSABS 0.1 11/16/2017 1538   BASOSABS 0.1 06/07/2013 0756     Chest Imaging: 06/22/2020: Right upper lobe 8 mm pulmonary nodule evidence of emphysema. Reviewed images today in office with patient. The patient's images have been independently reviewed by me.    Pulmonary Functions Testing Results: No flowsheet data found.  FeNO:   Pathology:   Echocardiogram:   Heart Catheterization:     Assessment & Plan:     ICD-10-CM   1. Right upper lobe pulmonary nodule  R91.1 CT Super D Chest Wo Contrast    Pulmonary Function Test    2. Pneumothorax, left  J93.9     3. Chronic obstructive pulmonary disease, unspecified COPD type ([place])  J44.9     4. Centrilobular emphysema ([place])  J43.2       Discussion: This is a 70 year old gentleman seen today for evaluation of enlarging right upper lobe pulmonary nodule.  Was not present 6 months ago and on a repeat scan in May 2022 revealed a 7 mm nodule.  Has a history of spontaneous pneumothorax on the left.  Was followed by thoracic surgery doing well from that standpoint.  Had a diagnosis of COPD.  No previous PFTs on file does have evidence of emphysema on CT imaging.  Plan: For his COPD I think he needs to be on a maintenance inhaler. Start samples of Stiolto New prescription for [employer] Continue albuterol nebulizer as needed New refills for albuterol inhaler. He needs full pulmonary function test prior to next office visit. Repeat noncontrasted CT scan of the chest in 3 months. Follow-up with me in 3 months.  We discussed today in the office if nodule is enlarging may consider biopsy versus evaluation for surgical resection.    Current Outpatient Medications:    aspirin EC 81 MG tablet, Take 81 mg by mouth daily. 1 tab by mouth in AM, Disp: , Rfl:    AZOPT 1 % ophthalmic suspension, Place 1 drop into both eyes 2 (two) times daily. (Brinzolamide), Disp:  , Rfl:    calcium carbonate (TUMS) 500 MG chewable tablet, Chew 1 tablet (200 mg of elemental calcium total) by mouth daily as needed for indigestion or heartburn., Disp: , Rfl:    Cholecalciferol (VITAMIN D3) 10 MCG (400 UNIT) CAPS, Take by mouth daily., Disp: , Rfl:    COMBIVENT RESPIMAT 20-100 MCG/ACT AERS respimat, INHALE 1 PUFF INTO THE LUNGS EVERY 6 HOURS AS NEEDED FOR WHEEZING OR SHORTNESS OF BREATH, Disp: 4 g, Rfl: 6   Cyanocobalamin 1000 MCG TBCR, Take 1 tablet by mouth daily. One tab by mouth daily, Disp: , Rfl:    Glucosamine-Chondroit-Vit C-Mn (GLUCOSAMINE 1500 COMPLEX) CAPS, Take 1 capsule by mouth daily., Disp: , Rfl:    losartan (COZAAR) 100 MG tablet, Take 100 mg by mouth daily., Disp: , Rfl:    LUMIGAN 0.01 % SOLN, , Disp: , Rfl:    Multiple Vitamin (MULTI-VITAMINS) TABS, Take  by mouth. 1 tab by mouth daily, Disp: , Rfl:    Netarsudil Dimesylate (RHOPRESSA OP), Apply to eye daily., Disp: , Rfl:    Omega-3 Fatty Acids (FISH OIL) 1000 MG CAPS, Take 1 capsule by mouth daily. 1 cap by mouth daily, Disp: , Rfl:    omeprazole (PRILOSEC) 40 MG capsule, Take 1 capsule (40 mg total) by mouth daily. For 3 weeks then as needed, Disp: 30 capsule, Rfl: 3   rosuvastatin (CRESTOR) 40 MG tablet, Take 40 mg by mouth daily., Disp: , Rfl:    sildenafil (REVATIO) 20 MG tablet, Take 1 tablet (20 mg total) by mouth 3 (three) times daily. 3-5 PRN 30 min prior to intercourse, Disp: 30 tablet, Rfl: 6   sotalol (BETAPACE) 80 MG tablet, Take 80 mg by mouth 2 (two) times daily. One tab by mouth twice a day, Disp: , Rfl:    tadalafil (ADCIRCA/CIALIS) 20 MG tablet, Take 1 tablet (20 mg total) by mouth daily as needed for erectile dysfunction., Disp: 10 tablet, Rfl: 11   RHOPRESSA 0.02 % SOLN, APPLY 1 DROP INTO RIGHT EYE EVERY MORNING., Disp: , Rfl:   I spent 62 minutes dedicated to the care of this patient on the date of this encounter to include pre-visit review of records, face-to-face time with the patient  discussing conditions above, post visit ordering of testing, clinical documentation with the electronic health record, making appropriate referrals as documented, and communicating necessary findings to members of the patients care team.   Garner Nash, [facility] 07/19/2020 1:53 PM

## 2020-07-19 NOTE — Patient Instructions (Addendum)
Thank you for visiting Dr. Valeta Harms at [facility]. Today we recommend the following:  Orders Placed This Encounter  Procedures   CT Super D Chest Wo Contrast   Pulmonary Function Test   Albuterol inhalers refills Stiolto samples and new prescription  Continue albuterol solution as needed   Return in about 3 months (around 10/19/2020) for with APP or Dr. Valeta Harms.    Please do your part to reduce the spread of COVID-19.

## 2020-07-22 NOTE — Anesthesia Preprocedure Evaluation (Addendum)
Anesthesia Evaluation  Patient identified by MRN, date of birth, ID band Patient awake    Reviewed: Allergy & Precautions, NPO status , Patient's Chart, lab work & pertinent test results  History of Anesthesia Complications Negative for: history of anesthetic complications  Airway Mallampati: I   Neck ROM: Full    Dental  (+) Upper Dentures, Partial Lower   Pulmonary COPD, former smoker,  Hx PTX 03/2019   Pulmonary exam normal breath sounds clear to auscultation       Cardiovascular hypertension, + CAD  Normal cardiovascular exam+ dysrhythmias (a fib)  Rhythm:Regular Rate:Normal     Neuro/Psych negative neurological ROS     GI/Hepatic GERD  ,  Endo/Other  negative endocrine ROS  Renal/GU Renal disease (nephrolithiasis)     Musculoskeletal  (+) Arthritis ,   Abdominal   Peds  Hematology Skin BCC   Anesthesia Other Findings   Reproductive/Obstetrics                             Anesthesia Physical  Anesthesia Plan  ASA: III  Anesthesia Plan: MAC   Post-op Pain Management:    Induction: Intravenous  PONV Risk Score and Plan: 1 and TIVA, Midazolam and Treatment may vary due to age or medical condition  Airway Management Planned: Nasal Cannula  Additional Equipment:   Intra-op Plan:   Post-operative Plan:   Informed Consent: I have reviewed the patients History and Physical, chart, labs and discussed the procedure including the risks, benefits and alternatives for the proposed anesthesia with the patient or authorized representative who has indicated his/her understanding and acceptance.       Plan Discussed with: CRNA  Anesthesia Plan Comments:        Anesthesia Quick Evaluation

## 2020-07-24 ENCOUNTER — Ambulatory Visit: Payer: Medicare Other | Admitting: Anesthesiology

## 2020-07-24 ENCOUNTER — Ambulatory Visit
Admission: RE | Admit: 2020-07-24 | Discharge: 2020-07-24 | Disposition: A | Discharge status: Home / Self Care | Payer: Medicare Other | Attending: Ophthalmology | Admitting: Ophthalmology

## 2020-07-24 ENCOUNTER — Other Ambulatory Visit: Payer: Self-pay

## 2020-07-24 ENCOUNTER — Encounter: Payer: Self-pay | Admitting: Ophthalmology

## 2020-07-24 ENCOUNTER — Encounter
Admission: RE | Disposition: A | Discharge status: Home / Self Care | Payer: Self-pay | Source: Home / Self Care | Attending: Ophthalmology

## 2020-07-24 DIAGNOSIS — H25811 Combined forms of age-related cataract, right eye: Secondary | ICD-10-CM | POA: Diagnosis not present

## 2020-07-24 DIAGNOSIS — Z9842 Cataract extraction status, left eye: Secondary | ICD-10-CM | POA: Insufficient documentation

## 2020-07-24 DIAGNOSIS — Z79899 Other long term (current) drug therapy: Secondary | ICD-10-CM | POA: Diagnosis not present

## 2020-07-24 DIAGNOSIS — H2511 Age-related nuclear cataract, right eye: Secondary | ICD-10-CM | POA: Insufficient documentation

## 2020-07-24 DIAGNOSIS — Z961 Presence of intraocular lens: Secondary | ICD-10-CM | POA: Diagnosis not present

## 2020-07-24 DIAGNOSIS — Z87891 Personal history of nicotine dependence: Secondary | ICD-10-CM | POA: Diagnosis not present

## 2020-07-24 DIAGNOSIS — Z7982 Long term (current) use of aspirin: Secondary | ICD-10-CM | POA: Insufficient documentation

## 2020-07-24 HISTORY — PX: CATARACT EXTRACTION W/PHACO: SHX586

## 2020-07-24 SURGERY — PHACOEMULSIFICATION, CATARACT, WITH IOL INSERTION
Anesthesia: Monitor Anesthesia Care | Site: Eye | Laterality: Right

## 2020-07-24 MED ORDER — BRIMONIDINE TARTRATE-TIMOLOL 0.2-0.5 % OP SOLN
OPHTHALMIC | Status: DC | PRN
  Administered 2020-07-24: 1 [drp] via OPHTHALMIC

## 2020-07-24 MED ORDER — LIDOCAINE HCL (PF) 2 % IJ SOLN
INTRAOCULAR | Status: DC | PRN
  Administered 2020-07-24: 1 mL

## 2020-07-24 MED ORDER — MOXIFLOXACIN HCL 0.5 % OP SOLN
OPHTHALMIC | Status: DC | PRN
  Administered 2020-07-24: 0.2 mL via OPHTHALMIC

## 2020-07-24 MED ORDER — EPINEPHRINE PF 1 MG/ML IJ SOLN
INTRAOCULAR | Status: DC | PRN
  Administered 2020-07-24: 44 mL via OPHTHALMIC

## 2020-07-24 MED ORDER — ACETAMINOPHEN 325 MG PO TABS: 650.0000 mg | ORAL_TABLET | Freq: Once | ORAL | Status: DC | PRN

## 2020-07-24 MED ORDER — PHENYLEPHRINE HCL 10 % OP SOLN
1.0000 [drp] | OPHTHALMIC | Status: AC
  Administered 2020-07-24 (×3): 1 [drp] via OPHTHALMIC

## 2020-07-24 MED ORDER — LACTATED RINGERS IV SOLN: INTRAVENOUS | Status: DC

## 2020-07-24 MED ORDER — MIDAZOLAM HCL 2 MG/2ML IJ SOLN
INTRAMUSCULAR | Status: DC | PRN
  Administered 2020-07-24: 1 mg via INTRAVENOUS

## 2020-07-24 MED ORDER — NA CHONDROIT SULF-NA HYALURON 40-17 MG/ML IO SOLN
INTRAOCULAR | Status: DC | PRN
  Administered 2020-07-24: 1 mL via INTRAOCULAR

## 2020-07-24 MED ORDER — TETRACAINE HCL 0.5 % OP SOLN
1.0000 [drp] | OPHTHALMIC | Status: DC | PRN
  Administered 2020-07-24 (×3): 1 [drp] via OPHTHALMIC

## 2020-07-24 MED ORDER — CYCLOPENTOLATE HCL 2 % OP SOLN
1.0000 [drp] | OPHTHALMIC | Status: AC
  Administered 2020-07-24 (×3): 1 [drp] via OPHTHALMIC

## 2020-07-24 MED ORDER — FENTANYL CITRATE (PF) 100 MCG/2ML IJ SOLN
INTRAMUSCULAR | Status: DC | PRN
  Administered 2020-07-24: 50 ug via INTRAVENOUS

## 2020-07-24 MED ORDER — ACETAMINOPHEN 160 MG/5ML PO SOLN: 325.0000 mg | ORAL | Status: DC | PRN

## 2020-07-24 MED ORDER — ONDANSETRON HCL 4 MG/2ML IJ SOLN: 4.0000 mg | Freq: Once | INTRAMUSCULAR | Status: DC | PRN

## 2020-07-24 SURGICAL SUPPLY — 18 items
CANNULA ANT/CHMB 27GA (MISCELLANEOUS) ×6 IMPLANT
GLOVE SURG TRIUMPH 8.0 PF LTX (GLOVE) ×7 IMPLANT
GOWN STRL REUS W/ TWL LRG LVL3 (GOWN DISPOSABLE) ×2 IMPLANT
GOWN STRL REUS W/TWL LRG LVL3 (GOWN DISPOSABLE) ×6
LENS IOL TECNIS EYHANCE 19.0 (Intraocular Lens) ×2 IMPLANT
MARKER SKIN DUAL TIP RULER LAB (MISCELLANEOUS) ×3 IMPLANT
NDL FILTER BLUNT 18X1 1/2 (NEEDLE) ×1 IMPLANT
NEEDLE FILTER BLUNT 18X 1/2SAF (NEEDLE) ×2
NEEDLE FILTER BLUNT 18X1 1/2 (NEEDLE) ×1 IMPLANT
PACK EYE AFTER SURG (MISCELLANEOUS) ×3 IMPLANT
PACK OPTHALMIC (MISCELLANEOUS) ×1 IMPLANT
PACK PORFILIO (MISCELLANEOUS) IMPLANT
SUT ETHILON 10-0 CS-B-6CS-B-6 (SUTURE)
SUTURE EHLN 10-0 CS-B-6CS-B-6 (SUTURE) IMPLANT
SYR 3ML LL SCALE MARK (SYRINGE) ×3 IMPLANT
SYR TB 1ML LUER SLIP (SYRINGE) ×3 IMPLANT
WATER STERILE IRR 250ML POUR (IV SOLUTION) ×3 IMPLANT
WIPE NON LINTING 3.25X3.25 (MISCELLANEOUS) ×3 IMPLANT

## 2020-07-24 NOTE — Op Note (Signed)
PREOPERATIVE DIAGNOSIS:  Nuclear sclerotic cataract of the right eye.   POSTOPERATIVE DIAGNOSIS:  Cataract   OPERATIVE PROCEDURE:ORPROCALL@   SURGEON:  Birder Robson, MD.   ANESTHESIA:  Anesthesiologist: Darrin Nipper, MD CRNA: Dionne Bucy, CRNA  1.      Managed anesthesia care. 2.      0.86ml of Shugarcaine was instilled in the eye following the paracentesis.   COMPLICATIONS:  None.   TECHNIQUE:   Stop and chop   DESCRIPTION OF PROCEDURE:  The patient was examined and consented in the preoperative holding area where the aforementioned topical anesthesia was applied to the right eye and then brought back to the Operating Room where the right eye was prepped and draped in the usual sterile ophthalmic fashion and a lid speculum was placed. A paracentesis was created with the side port blade and the anterior chamber was filled with viscoelastic. A near clear corneal incision was performed with the steel keratome. A continuous curvilinear capsulorrhexis was performed with a cystotome followed by the capsulorrhexis forceps. Hydrodissection and hydrodelineation were carried out with BSS on a blunt cannula. The lens was removed in a stop and chop  technique and the remaining cortical material was removed with the irrigation-aspiration handpiece. The capsular bag was inflated with viscoelastic and the Technis [id number]  lens was placed in the capsular bag without complication. The remaining viscoelastic was removed from the eye with the irrigation-aspiration handpiece. The wounds were hydrated. The anterior chamber was flushed with BSS and the eye was inflated to physiologic pressure. 0.22ml of Vigamox was placed in the anterior chamber. The wounds were found to be water tight. The eye was dressed with Combigan. The patient was given protective glasses to wear throughout the day and a shield with which to sleep tonight. The patient was also given drops with which to begin a drop regimen today and will  follow-up with me in one day. Implant Name Type Inv. Item Serial No. Manufacturer Lot No. LRB No. Used Action  LENS IOL TECNIS EYHANCE 19.0 - [id number] Intraocular Lens LENS IOL TECNIS EYHANCE 19.0 [id number] JOHNSON   Right 1 Implanted   Procedure(s) with comments: CATARACT EXTRACTION PHACO AND INTRAOCULAR LENS PLACEMENT ([facility]) RIGHT (Right) - 6.65 0:41.0  Electronically signed: Birder Robson 07/24/2020 9:44 AM

## 2020-07-24 NOTE — Anesthesia Procedure Notes (Signed)
Procedure Name: MAC Date/Time: 07/24/2020 9:29 AM Performed by: Dionne Bucy, CRNA Pre-anesthesia Checklist: Patient identified, Emergency Drugs available, Suction available, Patient being monitored and Timeout performed Patient Re-evaluated:Patient Re-evaluated prior to induction Oxygen Delivery Method: Nasal cannula Placement Confirmation: positive ETCO2

## 2020-07-24 NOTE — H&P (Signed)
[facility]   Primary Care Physician:  Ria Bush, MD Ophthalmologist: Dr. George Ina  Pre-Procedure History & Physical: HPI:  Victor Cortez is a 70 y.o. male here for cataract surgery.   Past Medical History:  Diagnosis Date   Aortic atherosclerosis ([place])    by CT   Arthritis    Atrial fibrillation with RVR ([place]) 06/2013   during hospitalization   Basal cell carcinoma of nose 1998   CAD (coronary artery disease)    by CT   Cataract    Cholelithiasis 10/2017   by CT   COPD (chronic obstructive pulmonary disease) ([place])    Dysrhythmia    ED (erectile dysfunction)    Ex-smoker    GERD (gastroesophageal reflux disease)    Glaucoma    Dr. Matilde Sprang   Glaucoma    Hepatic steatosis    by CT   History of chicken pox    History of pneumonia 06/2013   with sepsis and [facility] hospitalization   Hyperlipidemia    Hypertension    Rosacea    Seborrheic dermatitis    Wears dentures    full upper, partial lower    Past Surgical History:  Procedure Laterality Date   CATARACT EXTRACTION W/PHACO Left 07/10/2020   Procedure: CATARACT EXTRACTION PHACO AND INTRAOCULAR LENS PLACEMENT (IOC) LEFT VIVITY TORIC 9.00 00:51.4;  Surgeon: Birder Robson, MD;  Location: [place];  Service: Ophthalmology;  Laterality: Left;   CYSTOSCOPY WITH URETHRAL DILATATION  12/04/2017   Procedure: CYSTOSCOPY WITH URETHRAL DILATATION;  Surgeon: Billey Co, MD;  Location: [facility];  Service: Urology;;   CYSTOSCOPY/URETEROSCOPY/HOLMIUM LASER/STENT PLACEMENT Left 12/04/2017   Procedure: CYSTOSCOPY/URETEROSCOPY/HOLMIUM LASER/STENT PLACEMENT;  Surgeon: Billey Co, MD;  Location: [facility];  Service: Urology;  Laterality: Left;   ESOPHAGOGASTRODUODENOSCOPY (EGD) WITH PROPOFOL N/A 12/21/2018   normal esophagus, normal stomach, peptic duodenitis - done for 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 Authorizing Provider  albuterol (VENTOLIN HFA) 108 (90 Base) MCG/ACT inhaler Inhale 2 puffs into the lungs every 6 (six) hours as needed for wheezing or shortness of breath. 07/19/20  Yes Icard, Octavio Graves, DO  aspirin EC 81 MG tablet Take 81 mg by mouth daily. 1 tab by mouth in AM   Yes [provider]  AZOPT 1 % ophthalmic suspension Place 1 drop into both eyes 2 (two) times daily. (Brinzolamide) 08/16/18  Yes [provider]  calcium carbonate (TUMS) 500 MG chewable tablet Chew 1 tablet (200 mg of elemental calcium total) by mouth daily as needed for indigestion or heartburn. 02/28/19  Yes Ria Bush, MD  Cholecalciferol (VITAMIN D3) 10 MCG (400 UNIT) CAPS Take by mouth daily.   Yes [provider]  COMBIVENT RESPIMAT 20-100 MCG/ACT AERS respimat INHALE 1 PUFF INTO THE LUNGS EVERY 6 HOURS AS NEEDED FOR WHEEZING OR SHORTNESS OF BREATH 11/07/19  Yes Ria Bush, MD  Cyanocobalamin 1000 MCG TBCR Take 1 tablet by mouth daily. One tab by mouth daily   Yes [provider]  Glucosamine-Chondroit-Vit C-Mn (GLUCOSAMINE 1500 COMPLEX) CAPS Take 1 capsule by mouth daily.   Yes [provider]  losartan (COZAAR) 100 MG tablet Take 100 mg by mouth daily.   Yes [provider]  LUMIGAN 0.01 % SOLN  11/28/19  Yes [provider]  Multiple Vitamin (MULTI-VITAMINS) TABS Take by mouth. 1 tab  by mouth daily   Yes [provider]  Netarsudil Dimesylate (RHOPRESSA OP) Apply to eye daily.   Yes [provider]  Omega-3 Fatty Acids (FISH OIL) 1000 MG CAPS Take 1 capsule by mouth daily. 1 cap by mouth daily   Yes [provider]  omeprazole (PRILOSEC) 40 MG capsule Take 1 capsule (40 mg total) by mouth daily. For 3 weeks then as needed 02/28/19  Yes Ria Bush, MD  rosuvastatin (CRESTOR) 40 MG tablet Take 40 mg by mouth daily. 10/25/18  Yes [provider]  sildenafil (REVATIO) 20 MG  tablet Take 1 tablet (20 mg total) by mouth 3 (three) times daily. 3-5 PRN 30 min prior to intercourse 01/09/20  Yes Billey Co, MD  sotalol (BETAPACE) 80 MG tablet Take 80 mg by mouth 2 (two) times daily. One tab by mouth twice a day 06/07/13  Yes [provider]  tadalafil (ADCIRCA/CIALIS) 20 MG tablet Take 1 tablet (20 mg total) by mouth daily as needed for erectile dysfunction. 01/14/18  Yes Billey Co, MD  Tiotropium Bromide-Olodaterol 2.5-2.5 MCG/ACT AERS Inhale 2 puffs into the lungs daily. 07/19/20  Yes Icard, Bradley L, DO  RHOPRESSA 0.02 % SOLN APPLY 1 DROP INTO RIGHT EYE EVERY MORNING. 06/28/20   [provider]  Tiotropium Bromide-Olodaterol 2.5-2.5 MCG/ACT AERS Inhale 2 puffs into the lungs daily. 07/19/20   Garner Nash, DO    Allergies as of 05/29/2020   (No Known Allergies)    Family History  Problem Relation Age of Onset   COPD Father    Cancer Father        lung (smoker)   COPD Mother    CAD Neg Hx    Stroke Neg Hx    Bladder Cancer Neg Hx    Prostate cancer Neg Hx     Social History   Socioeconomic History   Marital status: Married    Spouse name: Not on file   Number of children: 1   Years of education: Not on file   Highest education level: Not on file  Occupational History   Not on file  Tobacco Use   Smoking status: Former    Packs/day: 2.00    Years: 53.00    Pack years: 106.00    Types: Cigarettes    Quit date: 06/03/2013    Years since quitting: 7.1   Smokeless tobacco: Never  Vaping Use   Vaping Use: Never used  Substance and Sexual Activity   Alcohol use: No   Drug use: No   Sexual activity: Not on file  Other Topics Concern   Not on file  Social History Narrative   Retired   Lives with wife and son, 2 dogs, bird, fish (wife died 03/13/20)   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[place], now part time Sports administrator   Activity: rides bicycle   Diet: some water, fruits/vegetables daily   Social  Determinants of Radio broadcast assistant Strain: Not on file  Food Insecurity: Not on file  Transportation Needs: Not on file  Physical Activity: Not on file  Stress: Not on file  Social Connections: Not on file  Intimate Partner Violence: Not on file    Review of Systems: See HPI, otherwise negative ROS  Physical Exam: BP (!) 157/75   Pulse (!) 56   Temp (!) 97.1 F (36.2 C) (Temporal)   Ht 5\' 6"  (1.676 m)   Wt 70.8 kg   SpO2 98%  BMI 25.18 kg/m  General:   Alert,  pleasant and cooperative in NAD Head:  Normocephalic and atraumatic. Respiratory:  Normal work of breathing. Cardiovascular:  RRR  Impression/Plan: Norman Clay is here for cataract surgery.  Risks, benefits, limitations, and alternatives regarding cataract surgery have been reviewed with the patient.  Questions have been answered.  All parties agreeable.   Birder Robson, MD  07/24/2020, 9:17 AM

## 2020-07-24 NOTE — Transfer of Care (Signed)
Immediate Anesthesia Transfer of Care Note  Patient: Victor Cortez  Procedure(s) Performed: CATARACT EXTRACTION PHACO AND INTRAOCULAR LENS PLACEMENT (IOC) RIGHT (Right: Eye)  Patient Location: PACU  Anesthesia Type: MAC  Level of Consciousness: awake, alert  and patient cooperative  Airway and Oxygen Therapy: Patient Spontanous Breathing and Patient connected to supplemental oxygen  Post-op Assessment: Post-op Vital signs reviewed, Patient's Cardiovascular Status Stable, Respiratory Function Stable, Patent Airway and No signs of Nausea or vomiting  Post-op Vital Signs: Reviewed and stable  Complications: No notable events documented.

## 2020-07-24 NOTE — Anesthesia Postprocedure Evaluation (Signed)
Anesthesia Post Note  Patient: Victor Cortez  Procedure(s) Performed: CATARACT EXTRACTION PHACO AND INTRAOCULAR LENS PLACEMENT (IOC) RIGHT (Right: Eye)     Patient location during evaluation: PACU Anesthesia Type: MAC Level of consciousness: awake and alert, oriented and patient cooperative Pain management: pain level controlled Vital Signs Assessment: post-procedure vital signs reviewed and stable Respiratory status: spontaneous breathing, nonlabored ventilation and respiratory function stable Cardiovascular status: blood pressure returned to baseline and stable Postop Assessment: adequate PO intake Anesthetic complications: no   No notable events documented.  Darrin Nipper

## 2020-08-01 ENCOUNTER — Encounter: Payer: Self-pay | Admitting: Ophthalmology

## 2020-08-19 IMAGING — CR DG CHEST 2V
1 series · 2 of 2 positions shown · non-contrast
Comparison: Chest radiograph 03/15/2019; chest CT 03/21/2019.

CLINICAL DATA: Patient with continued chest pain. History of recent
left pneumothorax.

EXAM:
CHEST - 2 VIEW

[Series 1: dg chest 2 view · 0.14mm/px · 2 of 2 slices shown]
[im 1/2]
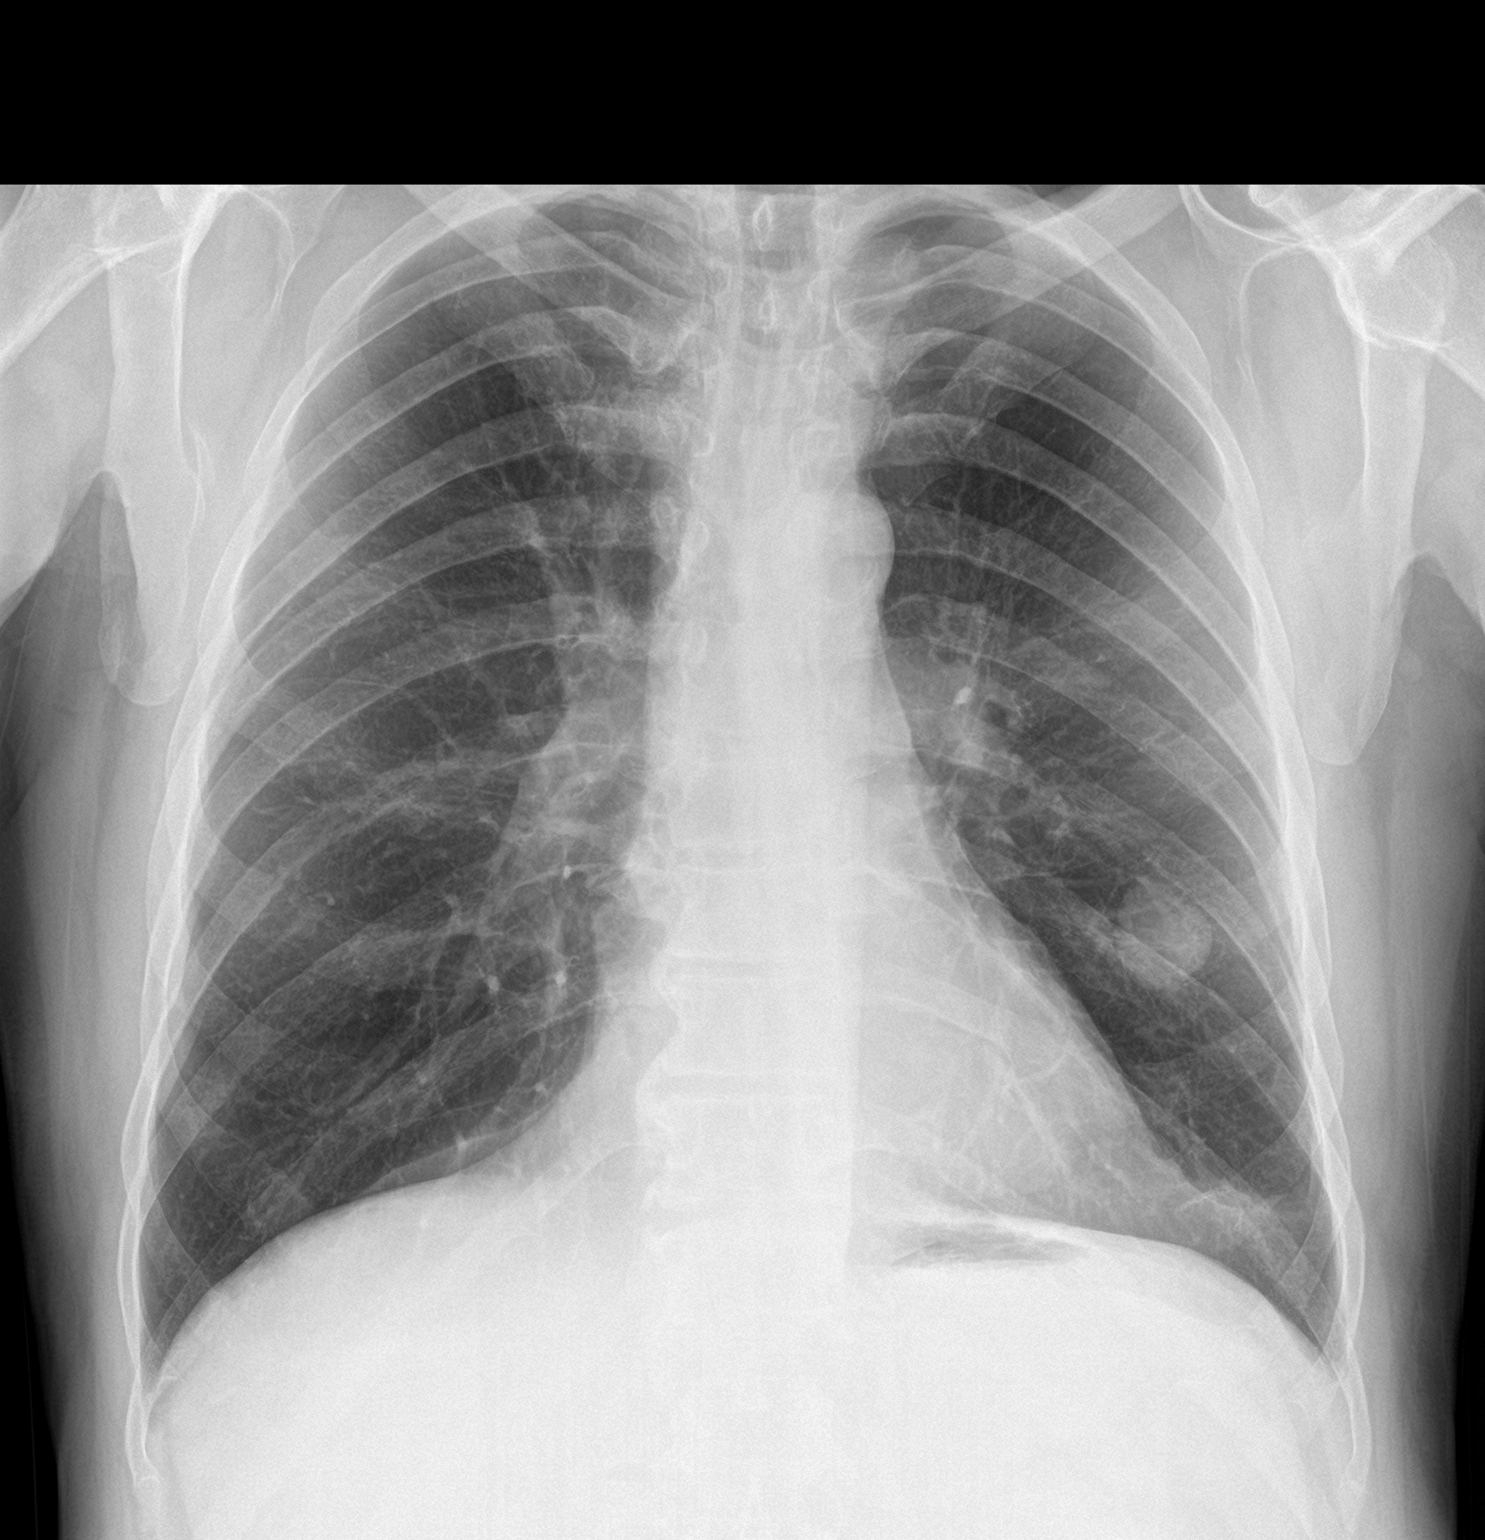
[im 2/2]
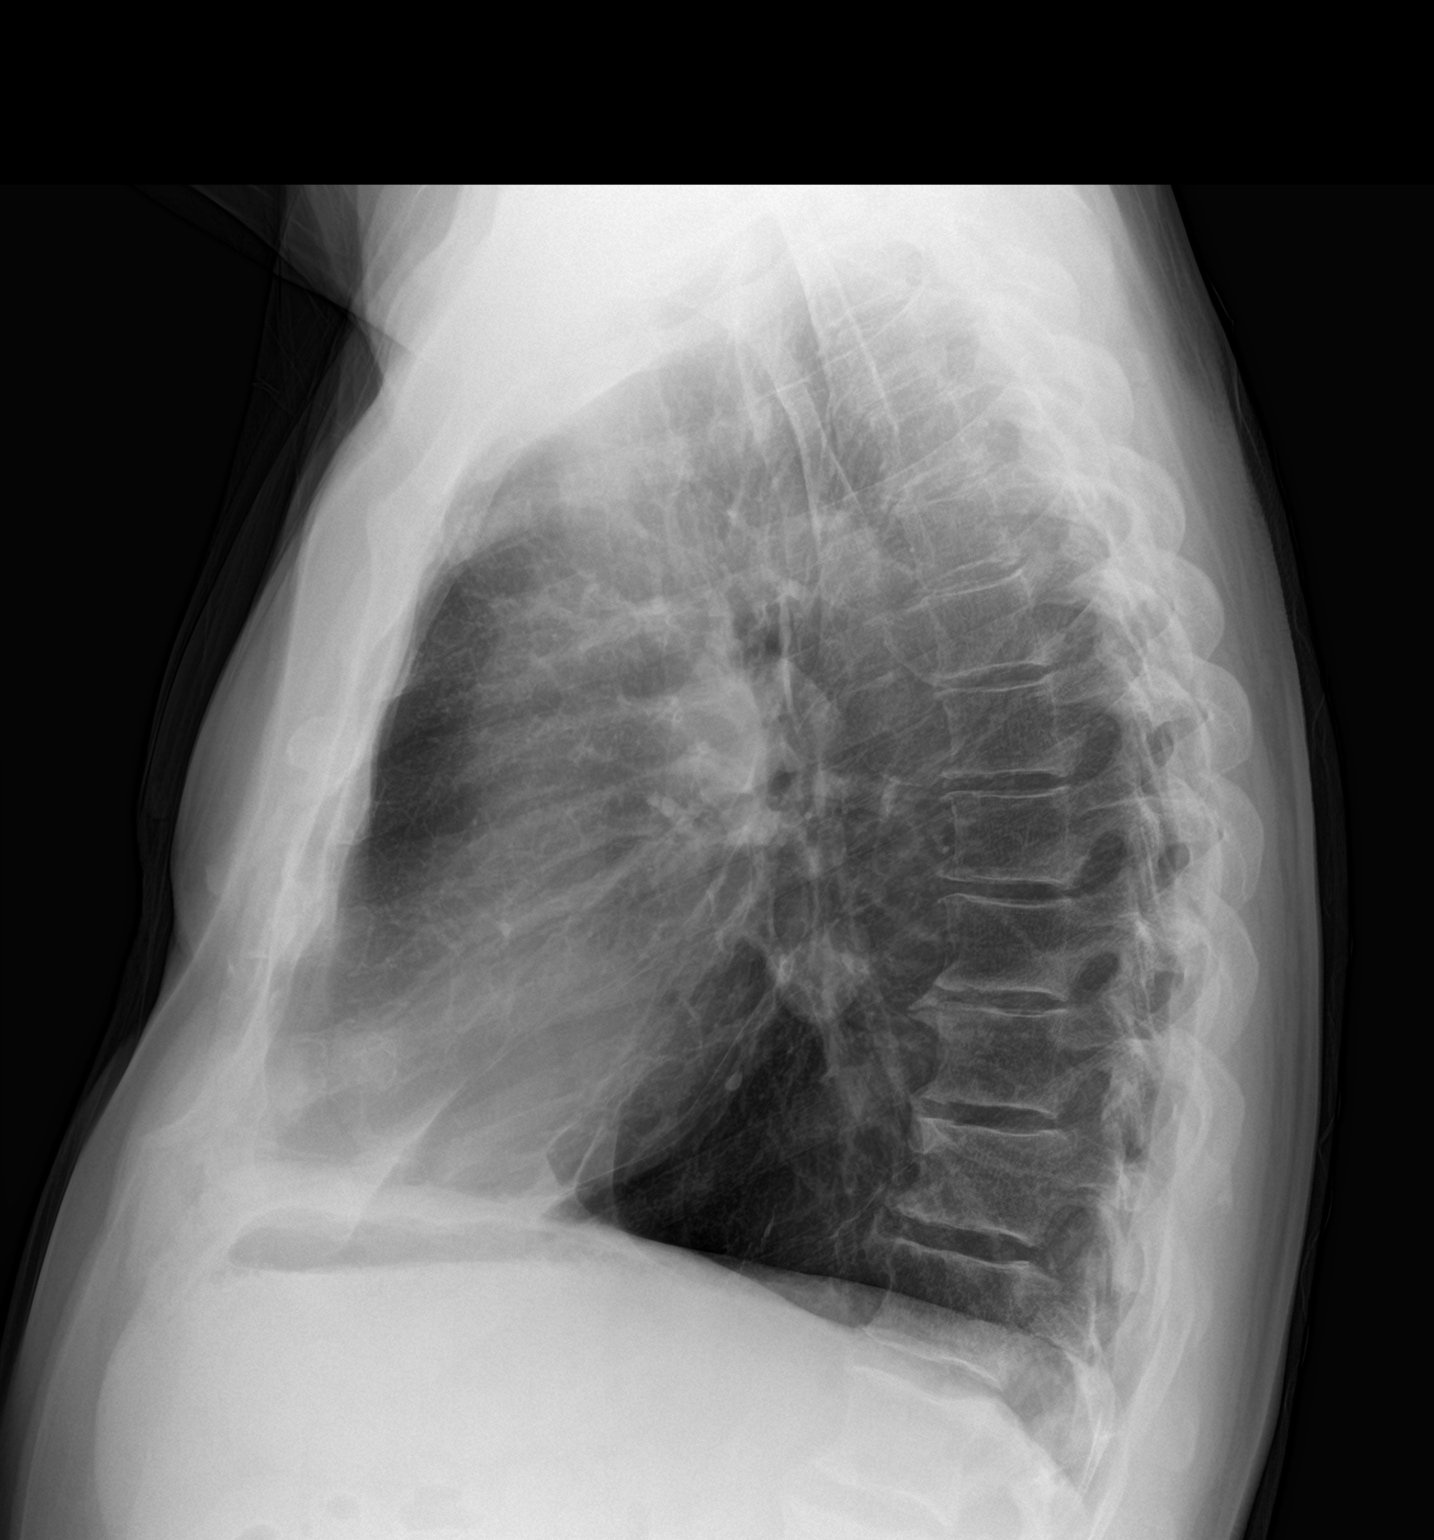

[2 of 2 positions shown; findings below may reference images not displayed]

FINDINGS: Normal cardiac and mediastinal contours. No consolidative pulmonary
opacities. No pleural effusion or pneumothorax. Minimal left basilar
atelectasis. Unchanged circumscribed masslike structure within the
left mid lung thoracic spine degenerative changes.
IMPRESSION: No definite pneumothorax.

Unchanged circumscribed nodular density within the left lower
hemithorax, better evaluated on recent chest CT.

## 2020-08-31 DIAGNOSIS — I10 Essential (primary) hypertension: Secondary | ICD-10-CM | POA: Diagnosis not present

## 2020-08-31 DIAGNOSIS — I4891 Unspecified atrial fibrillation: Secondary | ICD-10-CM | POA: Diagnosis not present

## 2020-08-31 DIAGNOSIS — J449 Chronic obstructive pulmonary disease, unspecified: Secondary | ICD-10-CM | POA: Diagnosis not present

## 2020-08-31 DIAGNOSIS — E782 Mixed hyperlipidemia: Secondary | ICD-10-CM | POA: Diagnosis not present

## 2020-08-31 DIAGNOSIS — I2510 Atherosclerotic heart disease of native coronary artery without angina pectoris: Secondary | ICD-10-CM | POA: Diagnosis not present

## 2020-09-14 DIAGNOSIS — H401112 Primary open-angle glaucoma, right eye, moderate stage: Secondary | ICD-10-CM | POA: Diagnosis not present

## 2020-10-12 DIAGNOSIS — H401132 Primary open-angle glaucoma, bilateral, moderate stage: Secondary | ICD-10-CM | POA: Diagnosis not present

## 2020-10-17 DIAGNOSIS — Z23 Encounter for immunization: Secondary | ICD-10-CM | POA: Diagnosis not present

## 2020-10-19 ENCOUNTER — Other Ambulatory Visit: Payer: Self-pay

## 2020-10-19 ENCOUNTER — Other Ambulatory Visit: Payer: Medicare Other

## 2020-10-19 ENCOUNTER — Ambulatory Visit (INDEPENDENT_AMBULATORY_CARE_PROVIDER_SITE_OTHER): Payer: Medicare Other | Admitting: Primary Care

## 2020-10-19 ENCOUNTER — Ambulatory Visit
Admission: RE | Admit: 2020-10-19 | Discharge: 2020-10-19 | Disposition: A | Discharge status: Home / Self Care | Payer: Medicare Other | Source: Ambulatory Visit | Attending: Pulmonary Disease | Admitting: Pulmonary Disease

## 2020-10-19 DIAGNOSIS — R911 Solitary pulmonary nodule: Secondary | ICD-10-CM | POA: Diagnosis not present

## 2020-10-19 DIAGNOSIS — R918 Other nonspecific abnormal finding of lung field: Secondary | ICD-10-CM | POA: Diagnosis not present

## 2020-10-19 DIAGNOSIS — I700 Atherosclerosis of aorta: Secondary | ICD-10-CM | POA: Diagnosis not present

## 2020-10-19 DIAGNOSIS — J439 Emphysema, unspecified: Secondary | ICD-10-CM | POA: Diagnosis not present

## 2020-10-19 MED ORDER — STIOLTO RESPIMAT 2.5-2.5 MCG/ACT IN AERS
2.0000 | INHALATION_SPRAY | Freq: Every day | RESPIRATORY_TRACT | 11 refills | Status: DC
Start: 2020-10-19 — End: 2021-04-25

## 2020-10-19 NOTE — Progress Notes (Signed)
Virtual Visit via Telephone Note  I connected with Victor Cortez on 10/19/20 at  2:00 PM EDT by telephone and verified that I am speaking with the correct person using two identifiers.  Location: Patient: Home Provider: Office   I discussed the limitations, risks, security and privacy concerns of performing an evaluation and management service by telephone and the availability of in person appointments. I also discussed with the patient that there may be a patient responsible charge related to this service. The patient expressed understanding and agreed to proceed.   History of Present Illness: 70 year old male, former smoker quit in May 2015 (106 pack year hx). PMH significant for COPD, left lower lobe consolidation, left pneumothorax, afib, CAD, hepatic steatosis, GERD, lymphangioma, glaucoma, hyperlipidemia. Patient of Dr. Valeta Harms, seen for initial consult on 07/19/20 RUL pneumonia.  Previous [facility] pulmonary encounter: 07/19/20- DR. Icard, consult This is a 70 year old gentleman, past medical history of atrial fibrillation, coronary artery disease, reflux, hypertension.Patient was initially seen by Dr. Kipp Brood by cardiothoracic surgery on 06/22/2020.  Office note reviewed.  Patient had history of pneumothorax treated with tube thoracostomy.  This was by Dr. Servando Snare in December 2021.  Follow-up CT imaging that was completed on 06/22/2020 revealed a new 8 mm right upper lobe pulmonary nodule.  He does have an extensive smoking history.  Prior diagnosis of COPD.  Has no pulmonary function tests on file.  Patient quit smoking in 2015.  Approximately 106-pack-year history.   OV 07/19/2020: COPD currently managed with Combivent as needed.  He does have CT evidence of emphysema.  Has albuterol to use as needed as well.  He does need refills of medications today.  From a COPD standpoint he does have occasional cough and dyspnea on exertion.  Able to complete most activities of daily living.  He did state  that he felt short of breath walking from the parking lot to the office today but the heat outside is making it worse.  Discussion: This is a 70 year old gentleman seen today for evaluation of enlarging right upper lobe pulmonary nodule.  Was not present 6 months ago and on a repeat scan in May 2022 revealed a 7 mm nodule.  Has a history of spontaneous pneumothorax on the left.  Was followed by thoracic surgery doing well from that standpoint.  Had a diagnosis of COPD.  No previous PFTs on file does have evidence of emphysema on CT imaging.   Plan: For his COPD I think he needs to be on a maintenance inhaler. Start samples of Stiolto New prescription for Victor Cortez Continue albuterol nebulizer as needed New refills for albuterol inhaler. He needs full pulmonary function test prior to next office visit. Repeat noncontrasted CT scan of the chest in 3 months. Follow-up with me in 3 months.  10/19/2020 Patient contacted today for 3 month follow-up. During his last visit he was started on maintenance inhaler, Stiolto respimat, for his COPD. He has not completed PFTs. He had Super-D CT chest today, results are pending.   He is doing well today, no acute complaints. He experiences dyspnea symptoms with moderate-heavy exertion only. No significant cough. He has been using Stiolto incorrectly, he was not aware that he should be taking it every day and instead using as needed. CT was reviewed by Dr. Valeta Harms, nodule looks stable and partially resolved. Reccommended repeating imaging in 6 months.     Observations/Objective:  - Able to speak in full sentences; no overt shortness of breath or wheezing  Assessment and Plan:  COPD: - Patient experiences dyspnea with moderate-heavy exertion. No recent exacerbations. Rare SABA use. ] - Reinforced importance of regular use LABA/LAMA - Continue Stiolto Respimat 2 puffs every morning (refill sent) - Needs pulmonary function testing scheduled at next  follow-up  Right upper lobe pulmonary nodule: - Former heavy smoker, 106 pack year hx. Asymptomatic. Denies cough, weight loss or hemoptysis. CT chest in May 2022 showed interval development of several new right sided pulmonary nodules measuring up to 84m  - Follow-up Super-D CT CHEST 10/19/2020, results pending/ reviewed by Dr. IValeta Harms nodule looks stable and partially resolved. Reccommended repeating imaging in 6 month   Follow Up Instructions:  6 months with PFTs   I discussed the assessment and treatment plan with the patient. The patient was provided an opportunity to ask questions and all were answered. The patient agreed with the plan and demonstrated an understanding of the instructions.   The patient was advised to call back or seek an in-person evaluation if the symptoms worsen or if the condition fails to improve as anticipated.  I provided 25 minutes of non-face-to-face time during this encounter.   EMartyn Ehrich NP

## 2020-10-19 NOTE — Patient Instructions (Signed)
CT was reviewed by Dr. Valeta Harms, nodule looks stable and partially resolved  Repeat CT chest in 6 months   Continue Stiolto - take two puffs daily in the morning  Refills for Stiolto sent to pharamcy  Follow-up in 6 months with PFTs/ Dr. Valeta Harms

## 2020-10-21 NOTE — Progress Notes (Signed)
Results discussed with patient during office visit with Derl Barrow, NP this week.   Thanks,  BLI  Garner Nash, DO [facility] 10/21/2020 3:21 PM

## 2020-11-02 DIAGNOSIS — I4891 Unspecified atrial fibrillation: Secondary | ICD-10-CM | POA: Diagnosis not present

## 2020-11-02 DIAGNOSIS — I2510 Atherosclerotic heart disease of native coronary artery without angina pectoris: Secondary | ICD-10-CM | POA: Diagnosis not present

## 2020-11-02 DIAGNOSIS — E782 Mixed hyperlipidemia: Secondary | ICD-10-CM | POA: Diagnosis not present

## 2020-11-02 DIAGNOSIS — J449 Chronic obstructive pulmonary disease, unspecified: Secondary | ICD-10-CM | POA: Diagnosis not present

## 2020-11-02 DIAGNOSIS — I10 Essential (primary) hypertension: Secondary | ICD-10-CM | POA: Diagnosis not present

## 2020-11-19 DIAGNOSIS — H401112 Primary open-angle glaucoma, right eye, moderate stage: Secondary | ICD-10-CM | POA: Diagnosis not present

## 2020-12-05 DIAGNOSIS — Z23 Encounter for immunization: Secondary | ICD-10-CM | POA: Diagnosis not present

## 2020-12-14 DIAGNOSIS — M1120 Other chondrocalcinosis, unspecified site: Secondary | ICD-10-CM | POA: Diagnosis not present

## 2020-12-14 DIAGNOSIS — M25562 Pain in left knee: Secondary | ICD-10-CM | POA: Diagnosis not present

## 2020-12-14 DIAGNOSIS — M11262 Other chondrocalcinosis, left knee: Secondary | ICD-10-CM | POA: Diagnosis not present

## 2020-12-20 DIAGNOSIS — H26492 Other secondary cataract, left eye: Secondary | ICD-10-CM | POA: Diagnosis not present

## 2021-03-04 DIAGNOSIS — I4891 Unspecified atrial fibrillation: Secondary | ICD-10-CM | POA: Diagnosis not present

## 2021-03-04 DIAGNOSIS — E782 Mixed hyperlipidemia: Secondary | ICD-10-CM | POA: Diagnosis not present

## 2021-03-04 DIAGNOSIS — I10 Essential (primary) hypertension: Secondary | ICD-10-CM | POA: Diagnosis not present

## 2021-03-04 DIAGNOSIS — I2510 Atherosclerotic heart disease of native coronary artery without angina pectoris: Secondary | ICD-10-CM | POA: Diagnosis not present

## 2021-03-04 DIAGNOSIS — I340 Nonrheumatic mitral (valve) insufficiency: Secondary | ICD-10-CM | POA: Diagnosis not present

## 2021-03-04 DIAGNOSIS — J449 Chronic obstructive pulmonary disease, unspecified: Secondary | ICD-10-CM | POA: Diagnosis not present

## 2021-04-09 ENCOUNTER — Ambulatory Visit
Admission: RE | Admit: 2021-04-09 | Discharge: 2021-04-09 | Disposition: A | Discharge status: Home / Self Care | Payer: Medicare Other | Source: Ambulatory Visit | Attending: Primary Care | Admitting: Primary Care

## 2021-04-09 DIAGNOSIS — R911 Solitary pulmonary nodule: Secondary | ICD-10-CM

## 2021-04-09 DIAGNOSIS — I700 Atherosclerosis of aorta: Secondary | ICD-10-CM | POA: Diagnosis not present

## 2021-04-09 DIAGNOSIS — J439 Emphysema, unspecified: Secondary | ICD-10-CM | POA: Diagnosis not present

## 2021-04-10 NOTE — Progress Notes (Signed)
You are seeing tomorrow - FYI

## 2021-04-11 ENCOUNTER — Ambulatory Visit (INDEPENDENT_AMBULATORY_CARE_PROVIDER_SITE_OTHER): Payer: Medicare Other | Admitting: Pulmonary Disease

## 2021-04-11 ENCOUNTER — Encounter: Payer: Self-pay | Admitting: Pulmonary Disease

## 2021-04-11 ENCOUNTER — Other Ambulatory Visit: Payer: Self-pay

## 2021-04-11 VITALS — BP 128/72 | HR 62 | Ht 66.0 in | Wt 166.4 lb

## 2021-04-11 DIAGNOSIS — J432 Centrilobular emphysema: Secondary | ICD-10-CM | POA: Diagnosis not present

## 2021-04-11 DIAGNOSIS — R918 Other nonspecific abnormal finding of lung field: Secondary | ICD-10-CM | POA: Diagnosis not present

## 2021-04-11 DIAGNOSIS — Z720 Tobacco use: Secondary | ICD-10-CM

## 2021-04-11 MED ORDER — ALBUTEROL SULFATE HFA 108 (90 BASE) MCG/ACT IN AERS: 2.0000 | INHALATION_SPRAY | Freq: Four times a day (QID) | RESPIRATORY_TRACT | 6 refills | Status: DC | PRN

## 2021-04-11 MED ORDER — BREZTRI AEROSPHERE 160-9-4.8 MCG/ACT IN AERO: 2.0000 | INHALATION_SPRAY | Freq: Two times a day (BID) | RESPIRATORY_TRACT | 0 refills | Status: DC

## 2021-04-11 NOTE — Patient Instructions (Addendum)
Thank you for visiting Dr. Valeta Harms at [facility]. ?Today we recommend the following: ? ?Orders Placed This Encounter  ?Procedures  ? Ambulatory Referral for Lung Cancer Scre  ? ?Meds ordered this encounter  ?Medications  ? albuterol (VENTOLIN HFA) 108 (90 Base) MCG/ACT inhaler  ?  Sig: Inhale 2 puffs into the lungs every 6 (six) hours as needed for wheezing or shortness of breath.  ?  Dispense:  8 g  ?  Refill:  6  ? ?Start breztri samples ?New prescription for breztri ?Stop Stiolto  ? ?LDCT in March 2024 ? ?Return in about 1 year (around 04/12/2022) for with Eric Form, NP, or Dr. Valeta Harms. ? ? ? ?Please do your part to reduce the spread of COVID-19.  ? ?

## 2021-04-11 NOTE — Progress Notes (Signed)
Synopsis: Referred in June 2022 for abnormal CT chest pulmonary nodule, by Ria Bush, MD  Subjective:   PATIENT ID: [place]: male DOB: [birth year], MRN: [id number]  Chief Complaint  Patient presents with   Follow-up    F/U after CT that was done on 04/08/21. States he has been doing well since last visit.      This is a 71 year old gentleman, past medical history of atrial fibrillation, coronary artery disease, reflux, hypertension.Patient was initially seen by Dr. Kipp Brood by cardiothoracic surgery on 06/22/2020.  Office note reviewed.  Patient had history of pneumothorax treated with tube thoracostomy.  This was by Dr. Servando Snare in December 2021.  Follow-up CT imaging that was completed on 06/22/2020 revealed a new 8 mm right upper lobe pulmonary nodule.  He does have an extensive smoking history.  Prior diagnosis of COPD.  Has no pulmonary function tests on file.  Patient quit smoking in 2015.  Approximately 106-pack-year history.   OV 07/19/2020: COPD currently managed with Combivent as needed.  He does have CT evidence of emphysema.  Has albuterol to use as needed as well.  He does need refills of medications today.  From a COPD standpoint he does have occasional cough and dyspnea on exertion.  Able to complete most activities of daily living.  He did state that he felt short of breath walking from the parking lot to the office today but the heat outside is making it worse.ref Orlene Plum can  OV 04/11/2021: Here today for follow-up after recent CT imaging of the chest.  Patient had a repeat super D CT for follow-up of a lung nodule.  This demonstrates that the lung nodule decreased in size from its previous.  Also has persistent evidence of centrilobular emphysema.  He started [employer] after last office visit he did feel like it helped.  He does still carry around albuterol and Primatene with mist.  Unfortunately still having some shortness of breath and wheezing at times.  He quit  smoking several years ago approximately 7 he states.   Past Medical History:  Diagnosis Date   Aortic atherosclerosis ([place])    by CT   Arthritis    Atrial fibrillation with RVR ([place]) 06/2013   during hospitalization   Basal cell carcinoma of nose 1998   CAD (coronary artery disease)    by CT   Cataract    Cholelithiasis 10/2017   by CT   COPD (chronic obstructive pulmonary disease) ([place])    Dysrhythmia    ED (erectile dysfunction)    Ex-smoker    GERD (gastroesophageal reflux disease)    Glaucoma    Dr. Matilde Sprang   Glaucoma    Hepatic steatosis    by CT   History of chicken pox    History of pneumonia 06/2013   with sepsis and [facility] hospitalization   Hyperlipidemia    Hypertension    Rosacea    Seborrheic dermatitis    Wears dentures    full upper, partial lower     Family History  Problem Relation Age of Onset   COPD Father    Cancer Father        lung (smoker)   COPD Mother    CAD Neg Hx    Stroke Neg Hx    Bladder Cancer Neg Hx    Prostate cancer Neg Hx      Past Surgical History:  Procedure Laterality Date   CATARACT EXTRACTION W/PHACO Left 07/10/2020   Procedure: CATARACT EXTRACTION  PHACO AND INTRAOCULAR LENS PLACEMENT (IOC) LEFT VIVITY TORIC 9.00 00:51.4;  Surgeon: Birder Robson, MD;  Location: [facility];  Service: Ophthalmology;  Laterality: Left;   CATARACT EXTRACTION W/PHACO Right 07/24/2020   Procedure: CATARACT EXTRACTION PHACO AND INTRAOCULAR LENS PLACEMENT ([place]) RIGHT;  Surgeon: Birder Robson, MD;  Location: [facility];  Service: Ophthalmology;  Laterality: Right;  6.65 0:41.0   CYSTOSCOPY WITH URETHRAL DILATATION  12/04/2017   Procedure: CYSTOSCOPY WITH URETHRAL DILATATION;  Surgeon: Billey Co, MD;  Location: [facility];  Service: Urology;;   CYSTOSCOPY/URETEROSCOPY/HOLMIUM LASER/STENT PLACEMENT Left 12/04/2017   Procedure: CYSTOSCOPY/URETEROSCOPY/HOLMIUM LASER/STENT PLACEMENT;  Surgeon: Billey Co, MD;  Location: [facility]  ORS;  Service: Urology;  Laterality: Left;   ESOPHAGOGASTRODUODENOSCOPY (EGD) WITH PROPOFOL N/A 12/21/2018   normal esophagus, normal stomach, peptic duodenitis - done for 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 Vicente Males, Bailey Mech, MD)   ROTATOR CUFF REPAIR  2016    Social History   Socioeconomic History   Marital status: Married    Spouse name: Not on file   Number of children: 1   Years of education: Not on file   Highest education level: Not on file  Occupational History   Not on file  Tobacco Use   Smoking status: Former    Packs/day: 2.00    Years: 53.00    Pack years: 106.00    Types: Cigarettes    Quit date: 06/03/2013    Years since quitting: 7.8   Smokeless tobacco: Never  Vaping Use   Vaping Use: Never used  Substance and Sexual Activity   Alcohol use: No   Drug use: No   Sexual activity: Not on file  Other Topics Concern   Not on file  Social History Narrative   Retired   Lives with wife and son, 2 dogs, bird, fish (wife died 03-13-2020)   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[place], now part time Sports administrator   Activity: rides bicycle   Diet: some water, fruits/vegetables daily   Social Determinants of Radio broadcast assistant Strain: Not on file  Food Insecurity: Not on file  Transportation Needs: Not on file  Physical Activity: Not on file  Stress: Not on file  Social Connections: Not on file  Intimate Partner Violence: Not on file     Allergies  Allergen Reactions   Xarelto [Rivaroxaban]     Coughed up blood     Outpatient Medications Prior to Visit  Medication Sig Dispense Refill   albuterol (VENTOLIN HFA) 108 (90 Base) MCG/ACT inhaler Inhale 2 puffs into the lungs every 6 (six) hours as needed for wheezing or shortness of breath. 8 g 6   aspirin EC 81 MG tablet Take 81 mg by mouth daily. 1 tab by mouth in AM     calcium carbonate (TUMS) 500 MG chewable tablet Chew 1 tablet (200 mg of elemental calcium total) by mouth daily as needed for  indigestion or heartburn.     Cholecalciferol (VITAMIN D3) 10 MCG (400 UNIT) CAPS Take by mouth daily.     COMBIVENT RESPIMAT 20-100 MCG/ACT AERS respimat INHALE 1 PUFF INTO THE LUNGS EVERY 6 HOURS AS NEEDED FOR WHEEZING OR SHORTNESS OF BREATH 4 g 6   Cyanocobalamin 1000 MCG TBCR Take 1 tablet by mouth daily. One tab by mouth daily     Glucosamine-Chondroit-Vit C-Mn (GLUCOSAMINE 1500 COMPLEX) CAPS Take 1 capsule by mouth daily.     losartan (COZAAR) 100 MG tablet Take 100 mg by mouth daily.  Multiple Vitamin (MULTI-VITAMINS) TABS Take by mouth. 1 tab by mouth daily     Netarsudil Dimesylate (RHOPRESSA OP) Apply to eye daily.     Omega-3 Fatty Acids (FISH OIL) 1000 MG CAPS Take 1 capsule by mouth daily. 1 cap by mouth daily     omeprazole (PRILOSEC) 40 MG capsule Take 1 capsule (40 mg total) by mouth daily. For 3 weeks then as needed 30 capsule 3   RHOPRESSA 0.02 % SOLN APPLY 1 DROP INTO RIGHT EYE EVERY MORNING.     rosuvastatin (CRESTOR) 40 MG tablet Take 40 mg by mouth daily.     sildenafil (REVATIO) 20 MG tablet Take 1 tablet (20 mg total) by mouth 3 (three) times daily. 3-5 PRN 30 min prior to intercourse 30 tablet 6   sotalol (BETAPACE) 80 MG tablet Take 80 mg by mouth 2 (two) times daily. One tab by mouth twice a day     tadalafil (ADCIRCA/CIALIS) 20 MG tablet Take 1 tablet (20 mg total) by mouth daily as needed for erectile dysfunction. 10 tablet 11   Tiotropium Bromide-Olodaterol (STIOLTO RESPIMAT) 2.5-2.5 MCG/ACT AERS Inhale 2 puffs into the lungs daily. 1 each 11   AZOPT 1 % ophthalmic suspension Place 1 drop into both eyes 2 (two) times daily. (Brinzolamide)     LUMIGAN 0.01 % SOLN      No facility-administered medications prior to visit.    Review of Systems  Constitutional:  Negative for chills, fever, malaise/fatigue and weight loss.  HENT:  Negative for hearing loss, sore throat and tinnitus.   Eyes:  Negative for blurred vision and double vision.  Respiratory:  Positive  for shortness of breath and wheezing. Negative for cough, hemoptysis, sputum production and stridor.   Cardiovascular:  Negative for chest pain, palpitations, orthopnea, leg swelling and PND.  Gastrointestinal:  Negative for abdominal pain, constipation, diarrhea, heartburn, nausea and vomiting.  Genitourinary:  Negative for dysuria, hematuria and urgency.  Musculoskeletal:  Negative for joint pain and myalgias.  Skin:  Negative for itching and rash.  Neurological:  Negative for dizziness, tingling, weakness and headaches.  Endo/Heme/Allergies:  Negative for environmental allergies. Does not bruise/bleed easily.  Psychiatric/Behavioral:  Negative for depression. The patient is not nervous/anxious and does not have insomnia.   All other systems reviewed and are negative.   Objective:  Physical Exam Vitals reviewed.  Constitutional:      General: He is not in acute distress.    Appearance: He is well-developed.  HENT:     Head: Normocephalic and atraumatic.  Eyes:     General: No scleral icterus.    Conjunctiva/sclera: Conjunctivae normal.     Pupils: Pupils are equal, round, and reactive to light.  Neck:     Vascular: No JVD.     Trachea: No tracheal deviation.  Cardiovascular:     Rate and Rhythm: Normal rate and regular rhythm.     Heart sounds: Normal heart sounds. No murmur heard. Pulmonary:     Effort: Pulmonary effort is normal. No tachypnea, accessory muscle usage or respiratory distress.     Breath sounds: No stridor. Wheezing present. No rhonchi or rales.     Comments: Diminished breath sounds bilaterally Abdominal:     General: There is no distension.     Palpations: Abdomen is soft.     Tenderness: There is no abdominal tenderness.  Musculoskeletal:        General: No tenderness.     Cervical back: Neck supple.  Lymphadenopathy:  Cervical: No cervical adenopathy.  Skin:    General: Skin is warm and dry.     Capillary Refill: Capillary refill takes less than 2  seconds.     Findings: No rash.  Neurological:     Mental Status: He is alert and oriented to person, place, and time.  Psychiatric:        Behavior: Behavior normal.     Vitals:   04/11/21 1430  BP: 128/72  Pulse: 62  SpO2: 98%  Weight: 166 lb 6.4 oz (75.5 kg)  Height: '5\' 6"'$  (1.676 m)   98% on RA BMI Readings from Last 3 Encounters:  04/11/21 26.86 kg/m  07/24/20 25.18 kg/m  07/19/20 25.36 kg/m   Wt Readings from Last 3 Encounters:  04/11/21 166 lb 6.4 oz (75.5 kg)  07/24/20 156 lb (70.8 kg)  07/19/20 157 lb 2 oz (71.3 kg)     CBC    Component Value Date/Time   WBC 7.2 03/10/2019 0628   RBC 3.89 (L) 03/10/2019 0628   HGB 11.3 (L) 03/10/2019 0628   HGB 12.6 (L) 06/07/2013 0756   HGB 13.1 06/07/2013 0000   HCT 34.0 (L) 03/10/2019 0628   HCT 38.8 (L) 06/07/2013 0756   PLT 229 03/10/2019 0628   PLT 249 06/07/2013 0756   MCV 87.4 03/10/2019 0628   MCV 87 06/07/2013 0756   MCH 29.0 03/10/2019 0628   MCHC 33.2 03/10/2019 0628   RDW 13.5 03/10/2019 0628   RDW 14.5 06/07/2013 0756   LYMPHSABS 2.4 11/16/2017 1538   LYMPHSABS 3.8 (H) 06/07/2013 0756   MONOABS 0.8 11/16/2017 1538   MONOABS 0.7 06/07/2013 0756   EOSABS 0.3 11/16/2017 1538   EOSABS 0.0 06/07/2013 0756   BASOSABS 0.1 11/16/2017 1538   BASOSABS 0.1 06/07/2013 0756     Chest Imaging: 06/22/2020: Right upper lobe 8 mm pulmonary nodule evidence of emphysema. Reviewed images today in office with patient. The patient's images have been independently reviewed by me.    04/09/2021 super D CT chest: 4 mm nodule on the left major fissure decreased in size.  Evidence of centrilobular emphysema. The patient's images have been independently reviewed by me.    Pulmonary Functions Testing Results: No flowsheet data found.  FeNO:   Pathology:   Echocardiogram:   Heart Catheterization:     Assessment & Plan:     ICD-10-CM   1. Centrilobular emphysema ([place])  J43.2     2. Tobacco use  Z72.0  Ambulatory Referral for Lung Cancer Scre    3. Multiple lung nodules  R91.8       Discussion:  This is a 71 year old gentleman seen with a right upper lobe pulmonary nodule as well as a nodule along the fissure line of the left major fissure.  Prior CT imaging in September revealed resolution of the right upper lobe nodule.  Fissure line nodule remains stable decreased in size from its previous.  He also has evidence of significant centrilobular Fatima concerning for COPD.  He did not have PFTs completed prior to last office visit  Plan: Since he is having breakthrough symptoms on Stiolto I think we should consider switching his inhaler regimen. Start [place], new prescription for [employer] today and instructed how to use it New refills for albuterol. Can reenroll in lung cancer screening program starting 1 year from now March 2024. Orders placed. Return to clinic in 1 year after lung cancer screening CT complete.    Current Outpatient Medications:  albuterol (VENTOLIN HFA) 108 (90 Base) MCG/ACT inhaler, Inhale 2 puffs into the lungs every 6 (six) hours as needed for wheezing or shortness of breath., Disp: 8 g, Rfl: 6   aspirin EC 81 MG tablet, Take 81 mg by mouth daily. 1 tab by mouth in AM, Disp: , Rfl:    calcium carbonate (TUMS) 500 MG chewable tablet, Chew 1 tablet (200 mg of elemental calcium total) by mouth daily as needed for indigestion or heartburn., Disp: , Rfl:    Cholecalciferol (VITAMIN D3) 10 MCG (400 UNIT) CAPS, Take by mouth daily., Disp: , Rfl:    COMBIVENT RESPIMAT 20-100 MCG/ACT AERS respimat, INHALE 1 PUFF INTO THE LUNGS EVERY 6 HOURS AS NEEDED FOR WHEEZING OR SHORTNESS OF BREATH, Disp: 4 g, Rfl: 6   Cyanocobalamin 1000 MCG TBCR, Take 1 tablet by mouth daily. One tab by mouth daily, Disp: , Rfl:    Glucosamine-Chondroit-Vit C-Mn (GLUCOSAMINE 1500 COMPLEX) CAPS, Take 1 capsule by mouth daily., Disp: , Rfl:    losartan (COZAAR) 100 MG tablet,  Take 100 mg by mouth daily., Disp: , Rfl:    Multiple Vitamin (MULTI-VITAMINS) TABS, Take by mouth. 1 tab by mouth daily, Disp: , Rfl:    Netarsudil Dimesylate (RHOPRESSA OP), Apply to eye daily., Disp: , Rfl:    Omega-3 Fatty Acids (FISH OIL) 1000 MG CAPS, Take 1 capsule by mouth daily. 1 cap by mouth daily, Disp: , Rfl:    omeprazole (PRILOSEC) 40 MG capsule, Take 1 capsule (40 mg total) by mouth daily. For 3 weeks then as needed, Disp: 30 capsule, Rfl: 3   RHOPRESSA 0.02 % SOLN, APPLY 1 DROP INTO RIGHT EYE EVERY MORNING., Disp: , Rfl:    rosuvastatin (CRESTOR) 40 MG tablet, Take 40 mg by mouth daily., Disp: , Rfl:    sildenafil (REVATIO) 20 MG tablet, Take 1 tablet (20 mg total) by mouth 3 (three) times daily. 3-5 PRN 30 min prior to intercourse, Disp: 30 tablet, Rfl: 6   sotalol (BETAPACE) 80 MG tablet, Take 80 mg by mouth 2 (two) times daily. One tab by mouth twice a day, Disp: , Rfl:    tadalafil (ADCIRCA/CIALIS) 20 MG tablet, Take 1 tablet (20 mg total) by mouth daily as needed for erectile dysfunction., Disp: 10 tablet, Rfl: 11   Tiotropium Bromide-Olodaterol (STIOLTO RESPIMAT) 2.5-2.5 MCG/ACT AERS, Inhale 2 puffs into the lungs daily., Disp: 1 each, Rfl: [place], DO [facility] 04/11/2021 2:56 PM

## 2021-04-12 ENCOUNTER — Other Ambulatory Visit: Payer: Self-pay

## 2021-04-12 DIAGNOSIS — Z87891 Personal history of nicotine dependence: Secondary | ICD-10-CM

## 2021-04-12 DIAGNOSIS — H40052 Ocular hypertension, left eye: Secondary | ICD-10-CM | POA: Diagnosis not present

## 2021-04-25 ENCOUNTER — Telehealth: Payer: Self-pay | Admitting: Pulmonary Disease

## 2021-04-25 MED ORDER — BREZTRI AEROSPHERE 160-9-4.8 MCG/ACT IN AERO: 2.0000 | INHALATION_SPRAY | Freq: Two times a day (BID) | RESPIRATORY_TRACT | 5 refills | Status: DC

## 2021-04-25 NOTE — Telephone Encounter (Signed)
Rx for Breztri has been sent to preferred pharmacy for pt. Called and spoke with pt letting him know this had been done and he verbalized understanding. Nothing further needed. 

## 2021-05-25 DIAGNOSIS — Z23 Encounter for immunization: Secondary | ICD-10-CM | POA: Diagnosis not present

## 2021-07-01 DIAGNOSIS — J449 Chronic obstructive pulmonary disease, unspecified: Secondary | ICD-10-CM | POA: Diagnosis not present

## 2021-07-01 DIAGNOSIS — E782 Mixed hyperlipidemia: Secondary | ICD-10-CM | POA: Diagnosis not present

## 2021-07-01 DIAGNOSIS — I10 Essential (primary) hypertension: Secondary | ICD-10-CM | POA: Diagnosis not present

## 2021-07-01 DIAGNOSIS — I2510 Atherosclerotic heart disease of native coronary artery without angina pectoris: Secondary | ICD-10-CM | POA: Diagnosis not present

## 2021-07-01 DIAGNOSIS — I340 Nonrheumatic mitral (valve) insufficiency: Secondary | ICD-10-CM | POA: Diagnosis not present

## 2021-07-01 DIAGNOSIS — R0602 Shortness of breath: Secondary | ICD-10-CM | POA: Diagnosis not present

## 2021-07-01 DIAGNOSIS — I4891 Unspecified atrial fibrillation: Secondary | ICD-10-CM | POA: Diagnosis not present

## 2021-07-11 DIAGNOSIS — Z961 Presence of intraocular lens: Secondary | ICD-10-CM | POA: Diagnosis not present

## 2021-07-11 DIAGNOSIS — H401112 Primary open-angle glaucoma, right eye, moderate stage: Secondary | ICD-10-CM | POA: Diagnosis not present

## 2021-07-11 DIAGNOSIS — H40052 Ocular hypertension, left eye: Secondary | ICD-10-CM | POA: Diagnosis not present

## 2021-07-25 DIAGNOSIS — H401112 Primary open-angle glaucoma, right eye, moderate stage: Secondary | ICD-10-CM | POA: Diagnosis not present

## 2021-07-25 DIAGNOSIS — H40052 Ocular hypertension, left eye: Secondary | ICD-10-CM | POA: Diagnosis not present

## 2021-07-26 DIAGNOSIS — R0602 Shortness of breath: Secondary | ICD-10-CM | POA: Diagnosis not present

## 2021-08-01 DIAGNOSIS — I340 Nonrheumatic mitral (valve) insufficiency: Secondary | ICD-10-CM | POA: Diagnosis not present

## 2021-08-01 DIAGNOSIS — I10 Essential (primary) hypertension: Secondary | ICD-10-CM | POA: Diagnosis not present

## 2021-08-01 DIAGNOSIS — E782 Mixed hyperlipidemia: Secondary | ICD-10-CM | POA: Diagnosis not present

## 2021-08-01 DIAGNOSIS — I2510 Atherosclerotic heart disease of native coronary artery without angina pectoris: Secondary | ICD-10-CM | POA: Diagnosis not present

## 2021-08-01 DIAGNOSIS — I4891 Unspecified atrial fibrillation: Secondary | ICD-10-CM | POA: Diagnosis not present

## 2021-08-01 DIAGNOSIS — J449 Chronic obstructive pulmonary disease, unspecified: Secondary | ICD-10-CM | POA: Diagnosis not present

## 2021-09-02 DIAGNOSIS — I340 Nonrheumatic mitral (valve) insufficiency: Secondary | ICD-10-CM | POA: Diagnosis not present

## 2021-09-02 DIAGNOSIS — E782 Mixed hyperlipidemia: Secondary | ICD-10-CM | POA: Diagnosis not present

## 2021-09-02 DIAGNOSIS — I10 Essential (primary) hypertension: Secondary | ICD-10-CM | POA: Diagnosis not present

## 2021-09-02 DIAGNOSIS — I2510 Atherosclerotic heart disease of native coronary artery without angina pectoris: Secondary | ICD-10-CM | POA: Diagnosis not present

## 2021-09-02 DIAGNOSIS — J449 Chronic obstructive pulmonary disease, unspecified: Secondary | ICD-10-CM | POA: Diagnosis not present

## 2021-09-02 DIAGNOSIS — I4891 Unspecified atrial fibrillation: Secondary | ICD-10-CM | POA: Diagnosis not present

## 2021-09-19 ENCOUNTER — Telehealth: Payer: Self-pay | Admitting: Pulmonary Disease

## 2021-09-19 NOTE — Telephone Encounter (Signed)
Patient is calling in reference to determining if he is able to get the RSV vaccine.  Please advise sir

## 2021-09-19 NOTE — Telephone Encounter (Signed)
Called and spoke with patient in regards to RSV shot. I advised him that we do not carry the vaccine in office but he is able to check with [facility], [facility], or [facility]. He voiced understanding. Nothing further needed

## 2021-10-31 DIAGNOSIS — Z23 Encounter for immunization: Secondary | ICD-10-CM | POA: Diagnosis not present

## 2021-11-20 DIAGNOSIS — Z23 Encounter for immunization: Secondary | ICD-10-CM | POA: Diagnosis not present

## 2021-12-02 DIAGNOSIS — I10 Essential (primary) hypertension: Secondary | ICD-10-CM | POA: Diagnosis not present

## 2021-12-02 DIAGNOSIS — I4891 Unspecified atrial fibrillation: Secondary | ICD-10-CM | POA: Diagnosis not present

## 2021-12-02 DIAGNOSIS — I480 Paroxysmal atrial fibrillation: Secondary | ICD-10-CM | POA: Diagnosis not present

## 2021-12-02 DIAGNOSIS — R0602 Shortness of breath: Secondary | ICD-10-CM | POA: Diagnosis not present

## 2021-12-02 DIAGNOSIS — I340 Nonrheumatic mitral (valve) insufficiency: Secondary | ICD-10-CM | POA: Diagnosis not present

## 2021-12-02 DIAGNOSIS — E782 Mixed hyperlipidemia: Secondary | ICD-10-CM | POA: Diagnosis not present

## 2021-12-02 DIAGNOSIS — I2510 Atherosclerotic heart disease of native coronary artery without angina pectoris: Secondary | ICD-10-CM | POA: Diagnosis not present

## 2022-01-22 DIAGNOSIS — H401112 Primary open-angle glaucoma, right eye, moderate stage: Secondary | ICD-10-CM | POA: Diagnosis not present

## 2022-04-07 ENCOUNTER — Ambulatory Visit (INDEPENDENT_AMBULATORY_CARE_PROVIDER_SITE_OTHER): Payer: Medicare Other | Admitting: Cardiovascular Disease

## 2022-04-07 ENCOUNTER — Encounter: Payer: Self-pay | Admitting: Cardiovascular Disease

## 2022-04-07 VITALS — BP 120/60 | HR 76 | Ht 66.0 in | Wt 158.4 lb

## 2022-04-07 DIAGNOSIS — I2510 Atherosclerotic heart disease of native coronary artery without angina pectoris: Secondary | ICD-10-CM | POA: Diagnosis not present

## 2022-04-07 DIAGNOSIS — E782 Mixed hyperlipidemia: Secondary | ICD-10-CM

## 2022-04-07 DIAGNOSIS — I482 Chronic atrial fibrillation, unspecified: Secondary | ICD-10-CM

## 2022-04-07 DIAGNOSIS — I4891 Unspecified atrial fibrillation: Secondary | ICD-10-CM

## 2022-04-07 NOTE — Progress Notes (Addendum)
Cardiology Office Note   Date:  07/08/2022   ID:  CAIL STOKLEY, DOB 02-May-1950, MRN [id number]  PCP:  Patient, No Pcp Per  Cardiologist:  Adrian Blackwater, MD      History of Present Illness: Victor Cortez is a 72 y.o. male who presents for  Chief Complaint  Patient presents with   Follow-up    4 mo follow up    No chest pain or SOB. Doing well.      Past Medical History:  Diagnosis Date   Aortic atherosclerosis (HCC)    Arthritis    Atrial fibrillation with RVR (HCC) 06/2013   a.) CHA2DS2VASc = 3 (age, HTN, vascular disease history); b.) rate/rhythm maintained on oral sotalol; no chronic anticoagulation   Basal cell carcinoma of nose 1998   Cholelithiasis 10/2017   COPD (chronic obstructive pulmonary disease) (HCC)    Coronary artery calcification seen on CT scan    Diastolic dysfunction    a.) TTE 07/29/2021: EF >55%, mild LVH, mild-mod MR, G1DD   ED (erectile dysfunction)    a.) on PDE5i (sildenafil) PRN   Ex-smoker    GERD (gastroesophageal reflux disease)    Glaucoma    Hepatic steatosis    History of bilateral cataract extraction 07/2020   History of chicken pox    History of pneumonia 06/2013   with sepsis and [facility] hospitalization   Hyperlipidemia    Hypertension    Nephrolithiasis    Right hydrocele    Rosacea    Seborrheic dermatitis    Urethral stricture    Wears dentures    full upper, partial lower     Past Surgical History:  Procedure Laterality Date   CATARACT EXTRACTION W/PHACO Left 07/10/2020   Procedure: CATARACT EXTRACTION PHACO AND INTRAOCULAR LENS PLACEMENT (IOC) LEFT VIVITY TORIC 9.00 00:51.4;  Surgeon: Galen Manila, MD;  Location: [facility];  Service: Ophthalmology;  Laterality: Left;   CATARACT EXTRACTION W/PHACO Right 07/24/2020   Procedure: CATARACT EXTRACTION PHACO AND INTRAOCULAR LENS PLACEMENT (IOC) RIGHT;  Surgeon: Galen Manila, MD;  Location: [facility];  Service: Ophthalmology;  Laterality:  Right;  6.65 0:41.0   CYSTOSCOPY WITH LITHOLAPAXY N/A 05/02/2022   Procedure: CYSTOSCOPY WITH LITHOLAPAXY;  Surgeon: Sondra Come, MD;  Location: [facility];  Service: Urology;  Laterality: N/A;   CYSTOSCOPY WITH URETHRAL DILATATION  12/04/2017   Procedure: CYSTOSCOPY WITH URETHRAL DILATATION;  Surgeon: Sondra Come, MD;  Location: [facility];  Service: Urology;;   CYSTOSCOPY WITH URETHRAL DILATATION N/A 05/02/2022   Procedure: CYSTOSCOPY  URETHRAL DILATATION;  Surgeon: Sondra Come, MD;  Location: [facility];  Service: Urology;  Laterality: N/A;   CYSTOSCOPY/URETEROSCOPY/HOLMIUM LASER/STENT PLACEMENT Left 12/04/2017   Procedure: CYSTOSCOPY/URETEROSCOPY/HOLMIUM LASER/STENT PLACEMENT;  Surgeon: Sondra Come, MD;  Location: [facility];  Service: Urology;  Laterality: Left;   CYSTOSCOPY/URETEROSCOPY/HOLMIUM LASER/STENT PLACEMENT Right 05/02/2022   Procedure: CYSTOSCOPY/URETEROSCOPY/HOLMIUM LASER/STENT PLACEMENT/RETROGRADE WITH NWGNFAOZ;  Surgeon: Sondra Come, MD;  Location: [facility];  Service: Urology;  Laterality: Right;   ESOPHAGOGASTRODUODENOSCOPY (EGD) WITH PROPOFOL N/A 12/21/2018   normal esophagus, normal stomach, peptic duodenitis - 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 1 tab by mouth in AM     calcium carbonate (TUMS) 500 MG chewable tablet Chew 1 tablet (200 mg of  elemental calcium total) by mouth daily as needed for indigestion or heartburn.     Cholecalciferol (VITAMIN D3) 10 MCG (400 UNIT) CAPS Take by mouth daily.     Cyanocobalamin 1000 MCG TBCR Take 1 tablet by mouth daily. One tab by mouth daily     Glucosamine-Chondroit-Vit C-Mn (GLUCOSAMINE 1500 COMPLEX) CAPS Take 1 capsule by mouth daily.     Multiple Vitamin (MULTI-VITAMINS) TABS Take by mouth. 1 tab by mouth daily     Omega-3 Fatty Acids  (FISH OIL) 1000 MG CAPS Take 1 capsule by mouth daily. 1 cap by mouth daily     albuterol (VENTOLIN HFA) 108 (90 Base) MCG/ACT inhaler Inhale 2 puffs into the lungs every 6 (six) hours as needed for wheezing or shortness of breath. 8 g 6   amLODipine (NORVASC) 5 MG tablet TAKE 1 TABLET BY MOUTH DAILY 90 tablet 2   BREZTRI AEROSPHERE 160-9-4.8 MCG/ACT AERO INHALE 2 PUFFS INTO THE LUNGS IN THE MORNING AND AT BEDTIME 10.7 g 5   brinzolamide (AZOPT) 1 % ophthalmic suspension 1 drop 2 (two) times daily.     losartan (COZAAR) 100 MG tablet TAKE 1 TABLET BY MOUTH ONCE DAILY 90 tablet 3   omeprazole (PRILOSEC) 40 MG capsule Take 1 capsule (40 mg total) by mouth daily. For 3 weeks then as needed 30 capsule 3   rosuvastatin (CRESTOR) 40 MG tablet TAKE 1 TABLET BY MOUTH ONCE DAILY 90 tablet 0   sildenafil (REVATIO) 20 MG tablet Take 1 tablet (20 mg total) by mouth 3 (three) times daily. 3-5 PRN 30 min prior to intercourse 30 tablet 6   sotalol (BETAPACE) 80 MG tablet TAKE ONE TABLET BY MOUTH TWICE DAILY 180 tablet 0   No current facility-administered medications for this visit.    Allergies:   Xarelto [rivaroxaban]    Social History:   reports that he quit smoking about 9 years ago. His smoking use included cigarettes. He has a 106.00 pack-year smoking history. He has been exposed to tobacco smoke. He has never used smokeless tobacco. He reports that he does not drink alcohol and does not use drugs.   Family History:  family history includes COPD in his father and mother; Cancer in his father.    ROS:     Review of Systems  Constitutional: Negative.   HENT: Negative.    Eyes: Negative.   Respiratory: Negative.    Gastrointestinal: Negative.   Genitourinary: Negative.   Musculoskeletal: Negative.   Skin: Negative.   Neurological: Negative.   Endo/Heme/Allergies: Negative.   Psychiatric/Behavioral: Negative.    All other systems reviewed and are negative.    All other systems are reviewed  and negative.    PHYSICAL EXAM: VS:  BP 120/60   Pulse 76   Ht 5\' 6"  (1.676 m)   Wt 158 lb 6.4 oz (71.8 kg)   SpO2 95%   BMI 25.57 kg/m  , BMI Body mass index is 25.57 kg/m. Last weight:  Wt Readings from Last 3 Encounters:  05/22/22 159 lb (72.1 kg)  05/02/22 159 lb 15.8 oz (72.6 kg)  04/29/22 160 lb (72.6 kg)     Physical Exam Vitals reviewed.  Constitutional:      Appearance: Normal appearance. He is normal weight.  HENT:     Head: Normocephalic.     Nose: Nose normal.     Mouth/Throat:     Mouth: Mucous membranes are moist.  Eyes:     Pupils: Pupils are equal, round, and  reactive to light.  Cardiovascular:     Rate and Rhythm: Normal rate and regular rhythm.     Pulses: Normal pulses.     Heart sounds: Normal heart sounds.  Pulmonary:     Effort: Pulmonary effort is normal.  Abdominal:     General: Abdomen is flat. Bowel sounds are normal.  Musculoskeletal:        General: Normal range of motion.     Cervical back: Normal range of motion.  Skin:    General: Skin is warm.  Neurological:     General: No focal deficit present.     Mental Status: He is alert.  Psychiatric:        Mood and Affect: Mood normal.      Other studies Reviewed: REASON FOR VISIT  Visit for: Echocardiogram/R06.02  Sex:   Male         wt=  163  lbs.  BP=138/82  Height= 66   inches.        TESTS  Imaging: Echocardiogram:  An echocardiogram in (2-d) mode was performed and in Doppler mode with color flow velocity mapping was performed. The aortic valve cusps are abnormal 1.9  cm, flow velocity 1.20  m/s, and systolic calculated mean flow gradient 4  mmHg. Mitral valve diastolic peak flow velocity E .676    m/s and E/A ratio 0.7. Aortic root diameter 3.4  cm. The LVOT internal diameter 2.6  cm and flow velocity was abnormal 1.15    m/s. LV systolic dimension 2.59   cm, diastolic 3.69  cm, posterior wall thickness 1.3   cm, fractional shortening 29.8 %, and EF 57.8  %. IVS  thickness 1.13    cm. LA dimension 3.9 cm. Mitral Valve has Mild to Moderate Regurgitation.     ASSESSMENT  Technically adequate study.  Normal chamber sizes.  Normal left ventricular systolic function.  Mild left ventricular hypertrophy with GRADE 1 (relaxation abnormality) diastolic dysfunction.  Normal right ventricular systolic function.  Normal right ventricular diastolic function.  Normal left ventricular wall motion.  Normal right ventricular wall motion.  Normal pulmonary artery pressure.  Mild to Moderate mitral regurgitation.  No pericardial effusion.  Mild LVH.     THERAPY   Referring physician: Laurier Nancy  Sonographer: Adrian Blackwater.    Adrian Blackwater MD  Electronically signed by: Adrian Blackwater     Date: 07/29/2021 12:24 EKG:   Recent Labs: 04/28/2022: BUN 16; Creatinine, Ser 1.42; Potassium 5.2; Sodium 143 04/30/2022: Hemoglobin 13.7; Platelets 301    TESTS                                                                                          [facility] [address] [place], [place] [address] [phone] STUDY:  Gated Stress / Rest Myocardial Perfusion Imaging Tomographic (SPECT) Including attenuation correction Wall Motion, Left Ventricular Ejection Fraction By Gated Technique.Treadmill Stress Test. SEX:  Male    WEIGHT: 160 lbs  HEIGHT: 67 in        ARMS UP: YES/NO  REFERRING PHYSICIAN: Seward Meth NP-C                                                                                                                                                                                                                      INDICATION FOR STUDY: CAD                                                                                                                                                                                                                    TECHNIQUE:   Approximately 20 minutes following the intravenous administration of 10.4 mCi of Tc-13m Sestamibi after stress testing in a reclined supine position with arms above their head if able to do so, gated SPECT imaging of the heart was performed. After about a 2hr break, the patient was injected intravenously with 30.4 mCi of Tc-70m Sestamibi.  Approximately 45 minutes later in the same position as stress imaging SPECT rest imaging of the heart was performed.  STRESS BY:  Adrian Blackwater, MD PROTOCOL:  Smitty Cords  MAX PRED HR: 149                     85%:  127              75%:  112                                                                                                                  RESTING BP: 130/76   RESTING HR: 70  PEAK BP: 156/80   PEAK HR: 111 (74%)                                                                    EXERCISE DURATION:  6:02                                            METS: 7.0     REASON FOR TEST TERMINATION: Fatigue. SOB.                                                                                                                                 SYMPTOMS: Fatigue. SOB. DUKE TREADMILL SCORE: 6                                       RISK: Low  EKG RESULTS: NSR. 70/min. No acute changes, no significant ST changes at peak exercise.                                                             IMAGE QUALITY: Good                                                                                                                                                                                                                                                                                                                                   PERFUSION/WALL MOTION FINDINGS: EF = 74%. No perfusion defects, normal wall motion.                                                                          IMPRESSION: Normal stress test with normal LVEF.  Adrian Blackwater, MD Stress Interpreting Physician / Nuclear Interpreting Physician  Adrian Blackwater MD  Electronically signed by: Adrian Blackwater     Date: 05/01/2020 11:24  Lipid Panel    Component Value Date/Time   CHOL 202 (H) 11/16/2017 1538   TRIG 221.0 (H) 11/16/2017 1538   TRIG 75 04/28/2013 0000   HDL 40.90 11/16/2017 1538   CHOLHDL 5 11/16/2017 1538   VLDL 44.2 (H) 11/16/2017 1538   LDLCALC 154 (H) 08/31/2015 0900   LDLDIRECT 152.0 11/16/2017 1538      ASSESSMENT AND PLAN:    ICD-10-CM   1. Coronary artery disease involving native coronary artery of native heart without angina pectoris  I25.10    Doing well    2. Chronic atrial fibrillation (HCC)  I48.20     3. Lone atrial fibrillation with RVR  I48.91     4. Mixed hyperlipidemia  E78.2        Problem List Items Addressed This Visit       Cardiovascular and Mediastinum   Lone atrial fibrillation with RVR   CAD (coronary artery disease) - Primary   Chronic atrial fibrillation (HCC)     Other   Hyperlipidemia     Disposition:   Return in about 3 months (around 07/08/2022).    Total time spent: 30 minutes  Signed,  Adrian Blackwater, MD  07/08/2022 10:23 AM    [facility]

## 2022-04-07 NOTE — Patient Instructions (Signed)
Patient cleared for CDL

## 2022-04-10 ENCOUNTER — Other Ambulatory Visit: Payer: Self-pay

## 2022-04-10 ENCOUNTER — Ambulatory Visit
Admission: RE | Admit: 2022-04-10 | Discharge: 2022-04-10 | Disposition: A | Discharge status: Home / Self Care | Payer: Medicare Other | Source: Ambulatory Visit | Attending: Acute Care | Admitting: Acute Care

## 2022-04-10 DIAGNOSIS — E782 Mixed hyperlipidemia: Secondary | ICD-10-CM

## 2022-04-10 DIAGNOSIS — Z87891 Personal history of nicotine dependence: Secondary | ICD-10-CM

## 2022-04-11 MED ORDER — ROSUVASTATIN CALCIUM 40 MG PO TABS: 40.0000 mg | ORAL_TABLET | Freq: Every day | ORAL | 0 refills | Status: DC

## 2022-04-14 ENCOUNTER — Telehealth: Payer: Self-pay | Admitting: Acute Care

## 2022-04-14 NOTE — Telephone Encounter (Signed)
Sarah please advise on the following call report:  CLINICAL DATA:  72 year old male former smoker (quit in 2015) with 106 pack-year history of smoking. Lung cancer screening examination.   EXAM: CT CHEST WITHOUT CONTRAST LOW-DOSE FOR LUNG CANCER SCREENING   TECHNIQUE: Multidetector CT imaging of the chest was performed following the standard protocol without IV contrast.   RADIATION DOSE REDUCTION: This exam was performed according to the departmental dose-optimization program which includes automated exposure control, adjustment of the mA and/or kV according to patient size and/or use of iterative reconstruction technique.   COMPARISON:  Chest CT 04/09/2021. Low-dose lung cancer screening chest CT 09/27/2018.   FINDINGS: Cardiovascular: Heart size is normal. There is no significant pericardial fluid, thickening or pericardial calcification. There is aortic atherosclerosis, as well as atherosclerosis of the great vessels of the mediastinum and the coronary arteries, including calcified atherosclerotic plaque in the left anterior descending, left circumflex and right coronary arteries. Mild calcifications of the aortic valve. Calcifications of the mitral annulus.   Mediastinum/Nodes: No pathologically enlarged mediastinal or hilar lymph nodes. Please note that accurate exclusion of hilar adenopathy is limited on noncontrast CT scans. Esophagus is unremarkable in appearance. No axillary lymphadenopathy.   Lungs/Pleura: Multiple small pulmonary nodules are noted, largest of which is in the left upper lobe (axial image 161), with a volume derived mean diameter of only 2.5 mm. Multiple small calcified granulomas are also noted. No other larger more suspicious appearing pulmonary nodules or masses are noted. No acute consolidative airspace disease. No pleural effusions. Mild diffuse bronchial wall thickening with mild centrilobular and paraseptal emphysema.   Upper Abdomen: Aortic  atherosclerosis. Effacement of the normal right renal sinus fat and mass-like appearance in the upper pole of the right kidney, new compared to the prior study.   Musculoskeletal: There are no aggressive appearing lytic or blastic lesions noted in the visualized portions of the skeleton.   IMPRESSION: 1. Lung-RADS 2S, benign appearance or behavior. Continue annual screening with low-dose chest CT without contrast in 12 months. 2. The "S" modifier above refers to potentially clinically significant non lung cancer related findings. Specifically, there is a mass-like appearance in the upper pole of the right kidney which effaces the normal right renal sinus fat, which is a clear change compared to the prior examination concerning for neoplasm. Further evaluation with hematuria protocol CT scan is recommended at this time to exclude neoplasm. 3. Aortic atherosclerosis, in addition to three-vessel coronary artery disease. Assessment for potential risk factor modification, dietary therapy or pharmacologic therapy may be warranted, if clinically indicated. 4. Mild diffuse bronchial wall thickening with mild centrilobular and paraseptal emphysema; imaging findings suggestive of underlying COPD. 5. There are calcifications of the aortic valve and mitral annulus. Echocardiographic correlation for evaluation of potential valvular dysfunction may be warranted if clinically indicated.   These results will be called to the ordering clinician or representative by the Radiologist Assistant, and communication documented in the PACS or [employer].   Aortic Atherosclerosis (ICD10-I70.0) and Emphysema (ICD10-J43.9).     Electronically Signed   By: Vinnie Langton M.D.   On: 04/11/2022 13:01

## 2022-04-14 NOTE — Telephone Encounter (Signed)
I have attempted to call the patient with the results of their  Low Dose CT Chest Lung cancer screening scan. There was no answer. I have left a HIPPA compliant VM requesting the patient call the office for the scan results. I included the office contact information in the message. We will await his return call. If no return call we will continue to call until patient is contacted.   I have called both the home and mobile numbers. I was able to leave a message on the home number . The mobile number had a VM that was full, I was not able to leave a message there.

## 2022-04-14 NOTE — Telephone Encounter (Signed)
Noted  

## 2022-04-14 NOTE — Telephone Encounter (Signed)
Call report. Pls cal [facility]

## 2022-04-15 ENCOUNTER — Telehealth: Payer: Self-pay | Admitting: Acute Care

## 2022-04-15 ENCOUNTER — Telehealth: Payer: Self-pay | Admitting: Family Medicine

## 2022-04-15 ENCOUNTER — Other Ambulatory Visit: Payer: Self-pay

## 2022-04-15 DIAGNOSIS — Z87891 Personal history of nicotine dependence: Secondary | ICD-10-CM

## 2022-04-15 DIAGNOSIS — N2889 Other specified disorders of kidney and ureter: Secondary | ICD-10-CM

## 2022-04-15 NOTE — Telephone Encounter (Signed)
Order placed for annual LDCT 2025

## 2022-04-15 NOTE — Telephone Encounter (Signed)
I have attempted to call the patient with the results of their  Low Dose CT Chest Lung cancer screening scan. There was no answer. I have left a HIPPA compliant VM requesting the patient call the office for the scan results. I included the office contact information in the message. We will await his return call. If no return call we will continue to call until patient is contacted.   

## 2022-04-15 NOTE — Telephone Encounter (Signed)
I have called the patient with the results of his low dose Ct chest. His scan was read as a Lung RADS 2: nodules that are benign in appearance and behavior with a very low likelihood of becoming a clinically active cancer due to size or lack of growth. Recommendation per radiology is for a repeat LDCT in 12 months.  There were several incidental findings that I wanted to make sure the patient was aware of so he can follow up appropriately.  There was a finding of a mass-like appearance in the upper pole of the right kidney which effaces the normal right renal sinus fat, which is a clear change compared to the prior examination concerning for neoplasm.Radiology recommend a hematuria protocol CT scan to exclude neoplasm. I am calling the patient's PCP office ,in addition to adding him to this message to make him aware, so he can follow up as is clinically indicated. I spoke with his nurse, who documented a message she was going to send him.   Additionally there was notation of three vessel CAD and . Patient is followed by cardiology, Dr.  Humphrey Rolls at  [facility]. He has aortic atherosclerosis, and calcifications of the aortic valve and mitral annulus. He does have an echo result, but no date.   Dr. Danise Mina, please follow up on the renal mass as you feel is clinically indicated. I have asked the patient to call you also.   Denise, 12 month follow up . I am notifying Dr. Danise Mina by phone through his office and also have him added to this note. Thanks so much  Echocardiogram:  An echocardiogram in (2-d) mode was performed and in Doppler mode with color flow velocity mapping was performed. The aortic valve cusps are abnormal 1.9  cm, flow velocity 0000000  m/s, and systolic calculated mean flow gradient 4  mmHg. Mitral valve diastolic peak flow velocity E .676    m/s and E/A ratio 0.7. Aortic root diameter 3.4  cm. The LVOT internal diameter 2.6  cm and flow velocity was abnormal 1.15     m/s. LV systolic dimension [id number]   cm, diastolic 0000000  cm, posterior wall thickness 1.3   cm, fractional shortening 29.8 %, and EF 57.8  %. IVS thickness 1.13    cm. LA dimension 3.9 cm. Mitral Valve has Mild to Moderate Regurgitation.   Normal chamber sizes.  Normal left ventricular systolic function.  Mild left ventricular hypertrophy with GRADE 1 (relaxation abnormality) diastolic dysfunction.  Normal right ventricular systolic function.  Normal right ventricular diastolic function.  Normal left ventricular wall motion.  Normal right ventricular wall motion.  Normal pulmonary artery pressure.  Mild to Moderate mitral regurgitation.  No pericardial effusion.  Mild LVH.

## 2022-04-15 NOTE — Telephone Encounter (Signed)
Eric Form, NP from [facility] called to let Dr. Darnell Level know that a there was a Incidental finding on right kidney. Judson Roch stated there was a new mass like appearance in the upper pole of right kidney. Judson Roch stated there was a clear change from prior imaging. Judson Roch states the recommendation for the Hematuria protocol call is a CT scan. Judson Roch wanted to know if Dr. Darnell Level wanted to do a follow up. Judson Roch stated she is adding Dr. Darnell Level to a telephone note that being sent to pt. Call back # [phone]

## 2022-04-15 NOTE — Telephone Encounter (Signed)
Noted. Thank you.  I will place new order for CT scan to follow up this finding.

## 2022-04-15 NOTE — Telephone Encounter (Signed)
See other phone note. Plz notify patient - I've ordered STAT CT scan to be done at [facility] for further evaluation of new mass to right kidney. We will call him tomorrow to get this scheduled.

## 2022-04-16 ENCOUNTER — Ambulatory Visit
Admission: RE | Admit: 2022-04-16 | Discharge: 2022-04-16 | Disposition: A | Discharge status: Home / Self Care | Payer: Medicare Other | Source: Ambulatory Visit | Attending: Family Medicine | Admitting: Family Medicine

## 2022-04-16 DIAGNOSIS — N2889 Other specified disorders of kidney and ureter: Secondary | ICD-10-CM

## 2022-04-16 MED ORDER — IOPAMIDOL (ISOVUE-300) INJECTION 61%
100.0000 mL | Freq: Once | INTRAVENOUS | Status: AC | PRN
  Administered 2022-04-16: 100 mL via INTRAVENOUS

## 2022-04-16 NOTE — Telephone Encounter (Signed)
Sent request to Referrals for STAT CT scan. Received a message that they are working on this.  Patient notified as instructed by telephone and verbalized understanding. Patient stated that he is at work until 2:00 pm today.  Patient stated that he ate breakfast this morning. Patient was advised not to eat anything else today until he hears back from the referral coordinator to get the scan scheduled.

## 2022-04-16 NOTE — Telephone Encounter (Signed)
See telephone note from 04/15/22

## 2022-04-16 NOTE — Telephone Encounter (Signed)
See other telephone note from 04/15/2022

## 2022-04-18 ENCOUNTER — Other Ambulatory Visit: Payer: Self-pay | Admitting: Family Medicine

## 2022-04-18 DIAGNOSIS — N281 Cyst of kidney, acquired: Secondary | ICD-10-CM | POA: Insufficient documentation

## 2022-04-18 DIAGNOSIS — N132 Hydronephrosis with renal and ureteral calculous obstruction: Secondary | ICD-10-CM | POA: Insufficient documentation

## 2022-04-18 DIAGNOSIS — N201 Calculus of ureter: Secondary | ICD-10-CM | POA: Insufficient documentation

## 2022-04-28 ENCOUNTER — Encounter: Payer: Self-pay | Admitting: Urology

## 2022-04-28 ENCOUNTER — Telehealth: Payer: Self-pay

## 2022-04-28 ENCOUNTER — Ambulatory Visit (INDEPENDENT_AMBULATORY_CARE_PROVIDER_SITE_OTHER): Payer: Medicare Other | Admitting: Urology

## 2022-04-28 ENCOUNTER — Other Ambulatory Visit: Payer: Self-pay | Admitting: Urology

## 2022-04-28 VITALS — BP 155/83 | HR 64 | Ht 66.0 in | Wt 160.0 lb

## 2022-04-28 DIAGNOSIS — N200 Calculus of kidney: Secondary | ICD-10-CM | POA: Diagnosis not present

## 2022-04-28 DIAGNOSIS — Z01818 Encounter for other preprocedural examination: Secondary | ICD-10-CM

## 2022-04-28 DIAGNOSIS — N210 Calculus in bladder: Secondary | ICD-10-CM

## 2022-04-28 DIAGNOSIS — N201 Calculus of ureter: Secondary | ICD-10-CM

## 2022-04-28 LAB — MICROSCOPIC EXAMINATION

## 2022-04-28 LAB — URINALYSIS, COMPLETE
Bilirubin, UA: NEGATIVE
Glucose, UA: NEGATIVE
Ketones, UA: NEGATIVE
Nitrite, UA: NEGATIVE
Protein,UA: NEGATIVE
Specific Gravity, UA: <1.005 — ABNORMAL LOW (ref 1.005–1.030)
Urobilinogen, Ur: 0.2 mg/dL (ref 0.2–1.0)
pH, UA: 5.5 (ref 5.0–7.5)

## 2022-04-28 MED ORDER — SULFAMETHOXAZOLE-TRIMETHOPRIM 800-160 MG PO TABS: 1.0000 | ORAL_TABLET | Freq: Two times a day (BID) | ORAL | 0 refills | Status: DC

## 2022-04-28 NOTE — Progress Notes (Signed)
   [facility]  Surgery Date: Date: 05/02/2022  Surgeon: Dr. Nickolas Madrid, MD  Inpt ( No  )   Outpt (Yes)   Obs ( No  )   Diagnosis: N20.1 Right Ureteral Stone, N21.0 Bladder Stone  -CPT: [phone], [phone], [phone]  Surgery: Right Ureteroscopy with Laser Lithotripsy and Stent Placement, Cystolitholapaxy and Possible Urethral Balloon Dilation using Optilume   Stop Anticoagulations: No, may continue all and may continue ASA  Cardiac/Medical/Pulmonary Clearance needed: no  *Orders entered into EPIC  Date: 04/28/22   *Case booked in [place]  Date: 04/28/22  *Notified pt of Surgery: Date: 04/28/22  PRE-OP UA & CX: yes, obtained in clinic today on 04/28/2022  *Placed into Prior Authorization Work Fabio Bering Date: 04/28/22  Assistant/laser/rep:No

## 2022-04-28 NOTE — Telephone Encounter (Signed)
-----   Message from Billey Co, MD sent at 04/28/2022 11:18 AM EDT ----- Regarding: Antibiotics before surgery Urinalysis was suspicious for bacteria, please start Bactrim DS twice daily x 7 days in anticipation of upcoming ureteroscopy, he should start this tonight, thanks  Nickolas Madrid, MD 04/28/2022

## 2022-04-28 NOTE — Telephone Encounter (Signed)
I spoke with [name]. We have discussed possible surgery dates and Friday March 29th, 2024 was agreed upon by all parties. Patient given information about surgery date, what to expect pre-operatively and post operatively.  We discussed that a Pre-Admission Testing office will be calling to set up the pre-op visit that will take place prior to surgery, and that these appointments are typically done over the phone with a Pre-Admissions RN. Informed patient that our office will communicate any additional care to be provided after surgery. Patients questions or concerns were discussed during our call. Advised to call our office should there be any additional information, questions or concerns that arise. Patient verbalized understanding.

## 2022-04-28 NOTE — Patient Instructions (Signed)
Laser Therapy for Kidney Stones Laser therapy for kidney stones is a procedure to break up rock-like masses that form inside the kidneys (kidney stones). It is done using a device that beams a strong light (laser) on the kidney stones. This breaks the stones up into small pieces. These small pieces may leave your body when you pee (urinate) or may be taken out during the procedure.  You may need laser therapy if you have kidney stones that are painful or that are stopping you from being able to pee. Tell a health care provider about: Any allergies you have. All medicines you are taking, including vitamins, herbs, eye drops, creams, and over-the-counter medicines. Any problems you or family members have had with anesthesia. Any bleeding problems you have. Any surgeries you have had. Any medical conditions you have. Whether you are pregnant or may be pregnant. What are the risks? Your health care provider will talk with you about risks. These may include: Infection. Bleeding. Allergic reactions to medicines. Damage to: The part of your body that drains pee (urine) from the bladder (urethra). The bladder. The tube that connects the bladder to the kidneys (ureter). Urinary tract infection (UTI). Urethral stricture. This is when the urethra is narrowed by scarring. Trouble peeing. Blockage of the kidney. This may be caused by a piece of kidney stone. What happens before the procedure? When to stop eating and drinking Follow instructions from your provider about what you may eat and drink. These may include: 8 hours before the procedure Stop eating most foods. Do not eat meat, fried foods, or fatty foods. Eat only light foods, such as toast or crackers. All liquids are okay except energy drinks and alcohol. 6 hours before the procedure Stop eating. Drink only clear liquids, such as water, clear fruit juice, black coffee, plain tea, and sports drinks. Do not drink energy drinks or  alcohol. 2 hours before the procedure Stop drinking all liquids. You may be allowed to take medicines with small sips of water. If you do not follow your provider's instructions, your procedure may be delayed or canceled. Medicines Ask your provider about: Changing or stopping your regular medicines. These include any diabetes medicines or blood thinners you take. Taking medicines such as aspirin and ibuprofen. These medicines can thin your blood. Do not take them unless your provider tells you to. Taking over-the-counter medicines, vitamins, herbs, and supplements. Tests You may have a physical exam before the procedure. You may also have tests done. These may include: Imaging tests. Blood or pee tests. Surgery safety Ask your provider: How your surgery site will be marked. What steps will be taken to help prevent infection. These steps may include: Removing hair at the surgery site. Washing skin with a soap that kills germs. Taking antibiotics. General instructions Do not use any products that contain nicotine or tobacco for at least 4 weeks before the procedure. These products include cigarettes, chewing tobacco, and vaping devices, such as e-cigarettes. If you need help quitting, ask your provider. If you will be going home right after the procedure, plan to have a responsible adult: Take you home from the hospital or clinic. You will not be allowed to drive. Care for you for the time you are told. What happens during the procedure?  An IV will be inserted into one of your veins. You will be given: A sedative. This helps you relax. Anesthesia. This keeps you from feeling pain. It will make you fall asleep for surgery. A tool  with a camera on the end (ureteroscope) will be put into your urethra. It will be moved through your bladder to your kidney. It will send pictures to a screen in the operating room. This will show what parts of your kidney need to be treated. A tube will be  put through the ureteroscope. It will be moved into your kidney. The laser device will be put into your kidney through the tube. The laser will be used to break up the kidney stones. A tool with a tiny wire basket may be put through the tube into your kidney. This can help remove the small pieces of the kidney stone. A small mesh tube (stent) may be placed to allow your kidney to drain. The tube and ureteroscope will be taken out at the end of the surgery. The procedure may vary among providers and hospitals. What happens after the procedure? Your blood pressure, heart rate, breathing rate, and blood oxygen level will be monitored until you leave the hospital or clinic. If you had a stent placed, it may have a string that will be secured to your skin. This helps your provider remove the stent. You may be given a strainer to collect any stone pieces that you pass in your pee. Your provider may have these tested. This information is not intended to replace advice given to you by your health care provider. Make sure you discuss any questions you have with your health care provider. Document Revised: 09/20/2021 Document Reviewed: 09/20/2021 Elsevier Patient Education  [employer].

## 2022-04-28 NOTE — Telephone Encounter (Signed)
Spoke with pt. Pt. Advised of all results and verbalized understanding. Medication sent in to [facility] per Patient request.

## 2022-04-28 NOTE — Progress Notes (Signed)
   04/28/2022 8:41 AM   Victor Cortez February 10, 1950 [id number]  Reason for visit: Right ureteral stone, bladder stones, urinary symptoms  HPI: 71 year old male I saw previously for left-sided ureteral stones who underwent ureteroscopy in 2019, at that time he was also found to have a fossa navicularis stricture and underwent serial dilation.  He opted for follow-up as needed.  He was re-referred for recent finding of a large 1.2 cm right mid ureteral stone with severe hydronephrosis and right renal atrophy, in addition to a 2 cm Bosniak 56F cyst in the left kidney seen on recent CT abdomen pelvis.  I personally viewed and interpreted those images, also with multiple 1 cm bladder stones.  He really denies any pain aside from some chronic lower back pain.  He does report foul-smelling urine, denies any dysuria or fever/chills, no gross hematuria.  He has some dribbling stream that is baseline but really denies any significant urinary complaints.  Urinalysis today is pending.  No recent renal function to review.  We reviewed his CT findings of a large right mid ureteral stone with hydronephrosis and renal atrophy, we discussed risk of long-term renal damage, as well as recurrent infections or pyelonephritis/sepsis.  We also reviewed the bladder stones, unclear if these are from chronic incomplete emptying, or ureteral stones that have just passed into the bladder.  With his minimal urinary symptoms, I do not think we need to jump straight outlet procedures at this time, especially with his history of stricture, but could consider Optilume balloon dilation if any stricture found at the time of ureteroscopy.  I recommended cystoscopy, cystolitholapaxy, right ureteroscopy, laser lithotripsy to address both his bladder stones and his large right mid ureteral stone.  We specifically discussed the risks ureteroscopy including bleeding, infection/sepsis, stent related symptoms including flank  pain/urgency/frequency/incontinence/dysuria, ureteral injury, inability to access stone, or need for staged or additional procedures.  -Follow-up urinalysis and BMP today -Schedule cystoscopy, possible urethral balloon dilation/Optilume, cystolitholapaxy, right ureteroscopy, laser lithotripsy, stent placement  Billey Co, MD  [facility] [address] [place], [place] [address] [phone]

## 2022-04-28 NOTE — Progress Notes (Signed)
Surgical Physician Order Form [facility]  * Scheduling expectation :  Friday 3/29  *Length of Case: 1 hour  *Clearance needed: no  *Anticoagulation Instructions: May continue all anticoagulants  *Aspirin Instructions: Ok to continue Aspirin  *Post-op visit Date/Instructions:   tbd  *Diagnosis: Right Ureteral Stone  *Procedure: right Ureteroscopy w/laser lithotripsy & stent placement [id number]), cystolitholapaxy, possible urethral dilation with Optilume balloon   Additional orders: N/A  -Admit type: OUTpatient  -Anesthesia: General  -VTE Prophylaxis Standing Order SCD's       Other:   -Standing Lab Orders Per Anesthesia    Lab other:  UA and culture 3/25  -Standing Test orders EKG/Chest x-ray per Anesthesia       Test other:   - Medications:  Ancef 2gm IV  -Other orders:  N/A

## 2022-04-29 ENCOUNTER — Encounter: Payer: Self-pay | Admitting: Urology

## 2022-04-29 ENCOUNTER — Other Ambulatory Visit: Payer: Self-pay

## 2022-04-29 ENCOUNTER — Encounter
Admission: RE | Admit: 2022-04-29 | Discharge: 2022-04-29 | Disposition: A | Discharge status: Home / Self Care | Payer: Medicare Other | Source: Ambulatory Visit | Attending: Urology | Admitting: Urology

## 2022-04-29 VITALS — Ht 66.0 in | Wt 160.0 lb

## 2022-04-29 DIAGNOSIS — I2510 Atherosclerotic heart disease of native coronary artery without angina pectoris: Secondary | ICD-10-CM

## 2022-04-29 DIAGNOSIS — I482 Chronic atrial fibrillation, unspecified: Secondary | ICD-10-CM

## 2022-04-29 LAB — BASIC METABOLIC PANEL
BUN/Creatinine Ratio: 11 (ref 10–24)
BUN: 16 mg/dL (ref 8–27)
CO2: 24 mmol/L (ref 20–29)
Calcium: 10.2 mg/dL (ref 8.6–10.2)
Chloride: 102 mmol/L (ref 96–106)
Creatinine, Ser: 1.42 mg/dL — ABNORMAL HIGH (ref 0.76–1.27)
Glucose: 103 mg/dL — ABNORMAL HIGH (ref 70–99)
Potassium: 5.2 mmol/L (ref 3.5–5.2)
Sodium: 143 mmol/L (ref 134–144)
eGFR: 52 mL/min/{1.73_m2} — ABNORMAL LOW (ref >59)

## 2022-04-29 NOTE — Patient Instructions (Addendum)
Your procedure is scheduled on: Friday May 02, 2022. Report to the Registration Desk on the 1st floor of the [facility]. To find out your arrival time, please call [phone] between 1PM - 3PM on: Thursday May 01, 2022. If your arrival time is 6:00 am, do not arrive before that time as the [facility] entrance doors do not open until 6:00 am.  REMEMBER: Instructions that are not followed completely may result in serious medical risk, up to and including death; or upon the discretion of your surgeon and anesthesiologist your surgery may need to be rescheduled.  Do not eat food or drink fluids after midnight the night before surgery.  No gum chewing or hard candies.   One week prior to surgery: Stop Anti-inflammatories (NSAIDS) such as Advil, Aleve, Ibuprofen, Motrin, Naproxen, Naprosyn and Aspirin based products such as Excedrin, Goody's Powder, BC Powder. Stop ANY OVER THE COUNTER supplements until after surgery. Omega-3 Fatty Acids (FISH OIL) 1000 MG  Glucosamine-Chondroit-Vit C-Mn (GLUCOSAMINE 1500 COMPLEX) CAPS   You may however, continue to take Tylenol if needed for pain up until the day of surgery.  Continue taking all prescribed medications with the exception of the following: Stop tadalafil (ADCIRCA/CIALIS) 20 MG 2 days prior to surgery.    Follow recommendations from Cardiologist or PCP regarding stopping blood thinners.  TAKE ONLY THESE MEDICATIONS THE MORNING OF SURGERY WITH A SIP OF WATER:  sotalol (BETAPACE) 80 MG  rosuvastatin (CRESTOR) 40 MG  omeprazole (PRILOSEC) 40 MG Antacid (take one the night before and one on the morning of surgery - helps to prevent nausea after surgery.) amLODipine (NORVASC) 5 MG  sulfamethoxazole-trimethoprim (BACTRIM DS) 800-160 MG   Use inhalers on the day of surgery and bring to the hospital. albuterol (VENTOLIN HFA) 108 (90 Base) MCG/ACT inhaler  Budeson-Glycopyrrol-Formoterol (BREZTRI AEROSPHERE) 160-9-4.8 MCG/ACT AERO     No Alcohol for 24 hours before or after surgery.  No Smoking including e-cigarettes for 24 hours before surgery.  No chewable tobacco products for at least 6 hours before surgery.  No nicotine patches on the day of surgery.  Do not use any "recreational" drugs for at least a week (preferably 2 weeks) before your surgery.  Please be advised that the combination of cocaine and anesthesia may have negative outcomes, up to and including death. If you test positive for cocaine, your surgery will be cancelled.  On the morning of surgery brush your teeth with toothpaste and water, you may rinse your mouth with mouthwash if you wish. Do not swallow any toothpaste or mouthwash.  Use CHG Soap or wipes as directed on instruction sheet.  Do not wear jewelry, make-up, hairpins, clips or nail polish.  Do not wear lotions, powders, or perfumes.   Do not shave body hair from the neck down 48 hours before surgery.  Contact lenses, hearing aids and dentures may not be worn into surgery.  Do not bring valuables to the hospital. [facility] is not responsible for any missing/lost belongings or valuables.   Total Shoulder Arthroplasty:  use Benzoyl Peroxide 5% Gel as directed on instruction sheet.  Bring your C-PAP to the hospital in case you may have to spend the night.   Notify your doctor if there is any change in your medical condition (cold, fever, infection).  Wear comfortable clothing (specific to your surgery type) to the hospital.  After surgery, you can help prevent lung complications by doing breathing exercises.  Take deep breaths and cough every 1-2 hours. Your  doctor may order a device called an Incentive Spirometer to help you take deep breaths. When coughing or sneezing, hold a pillow firmly against your incision with both hands. This is called "splinting." Doing this helps protect your incision. It also decreases belly discomfort.  If you are being admitted to the hospital  overnight, leave your suitcase in the car. After surgery it may be brought to your room.  In case of increased patient census, it may be necessary for you, the patient, to continue your postoperative care in the Same Day Surgery department.  If you are being discharged the day of surgery, you will not be allowed to drive home. You will need a responsible individual to drive you home and stay with you for 24 hours after surgery.   If you are taking public transportation, you will need to have a responsible individual with you.  Please call the [facility] Dept. at [phone] if you have any questions about these instructions.  Surgery Visitation Policy:  Patients having surgery or a procedure may have two visitors.  Children under the age of 67 must have an adult with them who is not the patient.  Inpatient Visitation:    Visiting hours are 7 a.m. to 8 p.m. Up to four visitors are allowed at one time in a patient room. The visitors may rotate out with other people during the day.  One visitor age 52 or older may stay with the patient overnight and must be in the room by 8 p.m.

## 2022-04-30 ENCOUNTER — Encounter: Payer: Self-pay | Admitting: Urology

## 2022-04-30 ENCOUNTER — Encounter
Admission: RE | Admit: 2022-04-30 | Discharge: 2022-04-30 | Disposition: A | Discharge status: Home / Self Care | Payer: Medicare Other | Source: Ambulatory Visit | Attending: Urology | Admitting: Urology

## 2022-04-30 DIAGNOSIS — Z01818 Encounter for other preprocedural examination: Secondary | ICD-10-CM | POA: Insufficient documentation

## 2022-04-30 DIAGNOSIS — I482 Chronic atrial fibrillation, unspecified: Secondary | ICD-10-CM

## 2022-04-30 DIAGNOSIS — I2510 Atherosclerotic heart disease of native coronary artery without angina pectoris: Secondary | ICD-10-CM

## 2022-04-30 DIAGNOSIS — I10 Essential (primary) hypertension: Secondary | ICD-10-CM | POA: Diagnosis not present

## 2022-04-30 LAB — CBC
HCT: 42.1 % (ref 39.0–52.0)
Hemoglobin: 13.7 g/dL (ref 13.0–17.0)
MCH: 29.5 pg (ref 26.0–34.0)
MCHC: 32.5 g/dL (ref 30.0–36.0)
MCV: 90.7 fL (ref 80.0–100.0)
Platelets: 301 K/uL (ref 150–400)
RBC: 4.64 MIL/uL (ref 4.22–5.81)
RDW: 13.3 % (ref 11.5–15.5)
WBC: 14.9 K/uL — ABNORMAL HIGH (ref 4.0–10.5)
nRBC: 0 % (ref 0.0–0.2)

## 2022-04-30 NOTE — Progress Notes (Signed)
Perioperative / Anesthesia Services  Pre-Admission Testing Clinical Review / Preoperative Anesthesia Consult  Date: 05/01/22  Patient Demographics:  Name: Victor Cortez DOB:   1950-10-21 MRN:   [id number]  Planned Surgical Procedure(s):    Case: [id number] Date/Time: 05/02/22 1333   Procedures:      CYSTOSCOPY/URETEROSCOPY/HOLMIUM LASER/STENT PLACEMENT (Right)     CYSTOSCOPY WITH LITHOLAPAXY     CYSTOSCOPY WITH BALLOON URETHRAL DILATATION   Anesthesia type: General   Pre-op diagnosis: Right Ureteral Victor Cortez, Bladder Stone   Location: [facility] OR ROOM 10 / [facility]   Surgeons: Billey Co, MD     NOTE: Available PAT nursing documentation and vital signs have been reviewed. Clinical nursing staff has updated patient's PMH/PSHx, current medication list, and drug allergies/intolerances to ensure comprehensive history available to assist in medical decision making as it pertains to the aforementioned surgical procedure and anticipated anesthetic course. Extensive review of available clinical information personally performed. [place] PMH and PSHx updated with any diagnoses/procedures that  may have been inadvertently omitted during his intake with the pre-admission testing department's nursing staff.  Clinical Discussion:  Victor Cortez is a 72 y.o. male who is submitted for pre-surgical anesthesia review and clearance prior to him undergoing the above procedure. Patient is a Former Smoker (106 pack years; quit 06/2013). Pertinent PMH includes: CAD, atrial fibrillation, diastolic dysfunction, aortic atherosclerosis, HTN, HLD, COPD, GERD (on daily PPI), ED (on PDE5i), nephrolithiasis, urethral stricture, glaucoma.  Patient is followed by cardiology Victor Rolls, MD). He was last seen in the cardiology clinic on 04/07/2022; notes reviewed. At the time of his clinic visit, patient doing well overall from a cardiovascular perspective. Patient denied any chest pain, shortness of  breath, PND, orthopnea, palpitations, significant peripheral edema, weakness, fatigue, vertiginous symptoms, or presyncope/syncope. Patient with a past medical history significant for cardiovascular diagnoses. Documented physical exam was grossly benign, providing no evidence of acute exacerbation and/or decompensation of the patient's known cardiovascular conditions.  Myocardial perfusion imaging study performed on 05/01/2020 revealed a normal left ventricular systolic function with a hyperdynamic LVEF of 74%.  There were no regional wall motion abnormalities.  Patient exercised a total of 6 minutes and 2 seconds achieving 7 METS of physical activity.  There was no evidence of stress-induced myocardial ischemia or arrhythmia; no scintigraphic evidence of scar.  Study determined to be normal and low risk.  Last TTE was performed on 07/09/2021 revealing a normal left ventricular systolic function with an EF of >55%.  There were no regional wall motion abnormalities.  Mild concentric LVH observed. Left ventricular diastolic Doppler parameters consistent with abnormal relaxation (G1DD). There was mild to moderate mitral valve regurgitation. All transvalvular gradients were noted to be normal providing no evidence suggestive of valvular stenosis.  Patient with an atrial fibrillation diagnosis; CHA2DS2-VASc Score = 3 (age, HTN, vascular disease history). His rate and rhythm are currently being maintained on oral sotalol.  Patient is not currently on chronic oral anticoagulation therapy.  Blood pressure well controlled at 120/60 mmHg on currently prescribed CCB (amlodipine), ARB (losartan), and beta-blocker (sotalol) therapies. He is on a rosuvastatin + omega-3 fatty acid for his HLD diagnosis and further ASCVD prevention. Of note, in the setting of known cardiovascular diagnoses, it is important to note that patient is on a PDE5i (sildenafil) for erectile dysfunction diagnosis. Patient is not diabetic. He does not  have an OSAH diagnosis.  No changes were made to his medication regimen.  Patient follow-up with outpatient cardiology  in 3 months or sooner if needed.  Victor Cortez is scheduled for an CYSTOSCOPY/URETEROSCOPY/HOLMIUM LASER/STENT PLACEMENT (Right); CYSTOSCOPY WITH LITHOLAPAXY; CYSTOSCOPY WITH BALLOON URETHRAL DILATATION on 05/02/2022 with Dr. Nickolas Madrid, MD.  Given patient's past medical history significant for cardiovascular diagnoses, presurgical cardiac clearance was sought by the PAT team. Per cardiology, "this patient is optimized for surgery and may proceed with the planned procedural course with a LOW risk of significant perioperative cardiovascular complications".  In review of his medication reconciliation, it is noted that patient is currently on prescribed daily antiplatelet therapy.  Per directives from patient's primary attending surgeon, patient may continue his daily low-dose ASA throughout his perioperative course.  Patient denies previous perioperative complications with anesthesia in the past. In review of the available records, it is noted that patient underwent a MAC anesthetic course at [facility] (ASA III) in 07/2020 without documented complications.      04/29/2022   10:50 AM 04/28/2022    8:19 AM 04/07/2022    2:08 PM  Vitals with BMI  Height 5\' 6"  5\' 6"  5\' 6"   Weight 160 lbs 160 lbs 158 lbs 6 oz  BMI 25.84 [id number]  Systolic  [id number]  Diastolic  83 60  Pulse  64 76    Providers/Specialists:   NOTE: Primary physician provider listed below. Patient may have been seen by APP or partner within same practice.   PROVIDER ROLE / SPECIALTY LAST Ranae Pila, MD Urology (Surgeon) 04/28/2022  Patient, No Pcp Per Primary Care Provider ???  Neoma Laming, MD Cardiology 04/07/2022  June Leap, MD Pulmonary Medicine 04/11/2021   Allergies:  Xarelto [rivaroxaban]  Current Home Medications:   No current facility-administered medications for  this encounter.    albuterol (VENTOLIN HFA) 108 (90 Base) MCG/ACT inhaler   amLODipine (NORVASC) 5 MG tablet   aspirin EC 81 MG tablet   brinzolamide (AZOPT) 1 % ophthalmic suspension   Budeson-Glycopyrrol-Formoterol (BREZTRI AEROSPHERE) 160-9-4.8 MCG/ACT AERO   calcium carbonate (TUMS) 500 MG chewable tablet   Cholecalciferol (VITAMIN D3) 10 MCG (400 UNIT) CAPS   Cyanocobalamin 1000 MCG TBCR   Glucosamine-Chondroit-Vit C-Mn (GLUCOSAMINE 1500 COMPLEX) CAPS   losartan (COZAAR) 100 MG tablet   Multiple Vitamin (MULTI-VITAMINS) TABS   Omega-3 Fatty Acids (FISH OIL) 1000 MG CAPS   omeprazole (PRILOSEC) 40 MG capsule   rosuvastatin (CRESTOR) 40 MG tablet   sildenafil (REVATIO) 20 MG tablet   sotalol (BETAPACE) 80 MG tablet   sulfamethoxazole-trimethoprim (BACTRIM DS) 800-160 MG tablet   History:   Past Medical History:  Diagnosis Date   Aortic atherosclerosis (HCC)    Arthritis    Atrial fibrillation with RVR (HCC) 06/2013   a.) CHA2DS2VASc = 3 (age, HTN, vascular disease history); b.) rate/rhythm maintained on oral sotalol; no chronic anticoagulation   Basal cell carcinoma of nose 1998   Cholelithiasis 10/2017   COPD (chronic obstructive pulmonary disease) (HCC)    Coronary artery calcification seen on CT scan    Diastolic dysfunction    a.) TTE 07/29/2021: EF >55%, mild LVH, mild-mod MR, G1DD   ED (erectile dysfunction)    a.) on PDE5i (sildenafil) PRN   Ex-smoker    GERD (gastroesophageal reflux disease)    Glaucoma    Hepatic steatosis    History of bilateral cataract extraction 07/2020   History of chicken pox    History of pneumonia 06/2013   with sepsis and [facility] hospitalization   Hyperlipidemia    Hypertension  Nephrolithiasis    Right hydrocele    Rosacea    Seborrheic dermatitis    Urethral stricture    Wears dentures    full upper, partial lower   Past Surgical History:  Procedure Laterality Date   CATARACT EXTRACTION W/PHACO Left 07/10/2020    Procedure: CATARACT EXTRACTION PHACO AND INTRAOCULAR LENS PLACEMENT (IOC) LEFT VIVITY TORIC 9.00 00:51.4;  Surgeon: Birder Robson, MD;  Location: [place];  Service: Ophthalmology;  Laterality: Left;   CATARACT EXTRACTION W/PHACO Right 07/24/2020   Procedure: CATARACT EXTRACTION PHACO AND INTRAOCULAR LENS PLACEMENT ([place]) RIGHT;  Surgeon: Birder Robson, MD;  Location: [place];  Service: Ophthalmology;  Laterality: Right;  6.65 0:41.0   CYSTOSCOPY WITH URETHRAL DILATATION  12/04/2017   Procedure: CYSTOSCOPY WITH URETHRAL DILATATION;  Surgeon: Billey Co, MD;  Location: [facility] ORS;  Service: Urology;;   CYSTOSCOPY/URETEROSCOPY/HOLMIUM LASER/STENT PLACEMENT Left 12/04/2017   Procedure: CYSTOSCOPY/URETEROSCOPY/HOLMIUM LASER/STENT PLACEMENT;  Surgeon: Billey Co, MD;  Location: [facility] ORS;  Service: Urology;  Laterality: Left;   ESOPHAGOGASTRODUODENOSCOPY (EGD) WITH PROPOFOL N/A 12/21/2018   normal esophagus, normal stomach, peptic duodenitis - done for 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 Vicente Males, Bailey Mech, MD)   ROTATOR CUFF REPAIR Right 2016   Family History  Problem Relation Age of Onset   COPD Father    Cancer Father        lung (smoker)   COPD Mother    CAD Neg Hx    Stroke Neg Hx    Bladder Cancer Neg Hx    Prostate cancer Neg Hx    Social History   Tobacco Use   Smoking status: Former    Packs/day: 2.00    Years: 53.00    Additional pack years: 0.00    Total pack years: 106.00    Types: Cigarettes    Quit date: 06/03/2013    Years since quitting: 8.9   Smokeless tobacco: Never  Vaping Use   Vaping Use: Never used  Substance Use Topics   Alcohol use: No   Drug use: No    Pertinent Clinical Results:  LABS:   Hospital Outpatient Visit on 04/30/2022  Component Date Value Ref Range Status   WBC 04/30/2022 14.9 (H)  4.0 - 10.5 K/uL Final   RBC 04/30/2022 4.64  4.22 - 5.81 MIL/uL Final   Hemoglobin 04/30/2022 13.7  13.0 - 17.0 g/dL Final   HCT  04/30/2022 42.1  39.0 - 52.0 % Final   MCV 04/30/2022 90.7  80.0 - 100.0 fL Final   MCH 04/30/2022 29.5  26.0 - 34.0 pg Final   MCHC 04/30/2022 32.5  30.0 - 36.0 g/dL Final   RDW 04/30/2022 13.3  11.5 - 15.5 % Final   Platelets 04/30/2022 301  150 - 400 K/uL Final   nRBC 04/30/2022 0.0  0.0 - 0.2 % Final   Performed at [facility], [address] [place], [place] [address]    ECG: Date: 05/01/2022 Time ECG obtained: 0816 AM Rate: 60 bpm Rhythm: normal sinus Axis (leads I and aVF): Normal Intervals: PR 158 ms. QRS 80 ms. QTc 446 ms. ST segment and T wave changes: No evidence of acute ST segment elevation or depression Comparison: Similar to previous tracing obtained on 03/07/2019   IMAGING / PROCEDURES: CT ABDOMEN PELVIS W WO CONTRAST performed on 04/16/2022 Obstructing 1.3 cm calculus in the proximal right ureter with associated moderate hydronephrosis and renal cortical thinning, implying some chronicity. Nonobstructing left renal calculi and bladder calculi. No evidence of  right renal mass. Enlarging indeterminate lesion in the upper pole of the left kidney measuring up to 2.0 cm in diameter. Based on previous studies, this is likely a complex cyst although is best classified as Bosniak 62F. Recommend further evaluation with renal MRI without and with contrast. Cholelithiasis without evidence of cholecystitis or biliary dilatation. Large right-sided hydrocele, incompletely visualized previously. Aortic atherosclerosis   CT CHEST LUNG CA SCREEN LOW DOSE W/O CM performed on 04/10/2022 Lung-RADS 2S, benign appearance or behavior. Continue annual screening with low-dose chest CT without contrast in 12 months.  The "S" modifier above refers to potentially clinically significant non lung cancer related findings. Specifically, there is a mass-like appearance in the upper pole of the right kidney which effaces the normal right renal sinus fat, which is a clear change compared to  the prior examination concerning for neoplasm. Further evaluation with hematuria protocol CT scan is recommended at this time to exclude neoplasm. Aortic atherosclerosis, in addition to three-vessel coronary artery disease. Assessment for potential risk factor modification, dietary therapy or pharmacologic therapy may be warranted, if clinically indicated. Mild diffuse bronchial wall thickening with mild centrilobular and paraseptal emphysema; imaging findings suggestive of underlying COPD. There are calcifications of the aortic valve and mitral annulus. Echocardiographic correlation for evaluation of potential valvular dysfunction may be warranted if clinically indicated.   TRANSTHORACIC ECHOCARDIOGRAM performed on 07/29/2021 Normal left ventricular systolic function with an EF of >55% Mild LVH No regional wall motion abnormalities Left ventricular diastolic Doppler parameters consistent with abnormal relaxation (G1DD). Normal right ventricular systolic function Normal pulmonary artery pressure Mild to moderate mitral valve regurgitation Normal gradients; no valvular stenosis No pericardial effusion  MYOCARDIAL PERFUSION IMAGING STUDY (LEXISCAN) performed on 05/01/2020 Normal left ventricular systolic function with a hyperdynamic LVEF of 74% Normal regional wall motion No evidence of stress-induced myocardial ischemia or arrhythmia; no scintigraphic evidence of scar Duke treadmill score 6 Able to achieve 7 METS following 6 minutes and 2 seconds of exercise Study determined to be normal and low risk.  Impression and Plan:  Victor Cortez has been referred for pre-anesthesia review and clearance prior to him undergoing the planned anesthetic and procedural courses. Available labs, pertinent testing, and imaging results were personally reviewed by me in preparation for upcoming operative/procedural course. [facility] medical record has been updated following extensive record review and  patient interview with PAT staff.   This patient has been appropriately cleared by cardiology with an overall LOW risk of significant perioperative cardiovascular complications. Based on clinical review performed today (05/01/22), barring any significant acute changes in the patient's overall condition, it is anticipated that he will be able to proceed with the planned surgical intervention. Any acute changes in clinical condition may necessitate his procedure being postponed and/or cancelled. Patient will meet with anesthesia team (MD and/or CRNA) on the day of his procedure for preoperative evaluation/assessment. Questions regarding anesthetic course will be fielded at that time.   Pre-surgical instructions were reviewed with the patient during his PAT appointment, and questions were fielded to satisfaction by PAT clinical staff. He has been instructed on which medications that he will need to hold prior to surgery, as well as the ones that have been deemed safe/appropriate to take of the day of his procedure. As part of the general education provided by PAT, patient made aware both verbally and in writing, that he would need to abstain from the use of any illegal substances during his perioperative course.  He was advised that  failure to follow the provided instructions could necessitate case cancellation or result serious perioperative complications up to and including death. Patient encouraged to contact PAT and/or his surgeon's office to discuss any questions or concerns that may arise prior to surgery; verbalized understanding.   Honor Loh, MSN, APRN, FNP-C, CEN [facility]  Peri-operative Services Nurse Practitioner Phone: [phone] Fax: [phone] 05/01/22 9:57 AM  NOTE: This note has been prepared using Dragon dictation software. Despite my best ability to proofread, there is always the potential that unintentional transcriptional errors may still occur from this  process.

## 2022-05-01 MED ORDER — CEFAZOLIN SODIUM-DEXTROSE 2-4 GM/100ML-% IV SOLN: 2.0000 g | INTRAVENOUS | Status: DC

## 2022-05-01 MED ORDER — ORAL CARE MOUTH RINSE: 15.0000 mL | Freq: Once | OROMUCOSAL | Status: AC

## 2022-05-01 MED ORDER — CHLORHEXIDINE GLUCONATE 0.12 % MT SOLN: 15.0000 mL | Freq: Once | OROMUCOSAL | Status: AC

## 2022-05-01 MED ORDER — LACTATED RINGERS IV SOLN: INTRAVENOUS | Status: DC

## 2022-05-02 ENCOUNTER — Other Ambulatory Visit: Payer: Self-pay

## 2022-05-02 ENCOUNTER — Ambulatory Visit
Admission: RE | Admit: 2022-05-02 | Discharge: 2022-05-02 | Disposition: A | Discharge status: Home / Self Care | Payer: Medicare Other | Source: Ambulatory Visit | Attending: Urology | Admitting: Urology

## 2022-05-02 ENCOUNTER — Encounter
Admission: RE | Disposition: A | Discharge status: Home / Self Care | Payer: Self-pay | Source: Ambulatory Visit | Attending: Urology

## 2022-05-02 ENCOUNTER — Ambulatory Visit: Payer: Medicare Other | Admitting: Urgent Care

## 2022-05-02 ENCOUNTER — Ambulatory Visit: Payer: Medicare Other

## 2022-05-02 DIAGNOSIS — N201 Calculus of ureter: Secondary | ICD-10-CM | POA: Diagnosis not present

## 2022-05-02 DIAGNOSIS — N35919 Unspecified urethral stricture, male, unspecified site: Secondary | ICD-10-CM | POA: Insufficient documentation

## 2022-05-02 DIAGNOSIS — N132 Hydronephrosis with renal and ureteral calculous obstruction: Secondary | ICD-10-CM | POA: Diagnosis not present

## 2022-05-02 DIAGNOSIS — I4891 Unspecified atrial fibrillation: Secondary | ICD-10-CM | POA: Diagnosis not present

## 2022-05-02 DIAGNOSIS — N1330 Unspecified hydronephrosis: Secondary | ICD-10-CM

## 2022-05-02 DIAGNOSIS — I2510 Atherosclerotic heart disease of native coronary artery without angina pectoris: Secondary | ICD-10-CM | POA: Diagnosis not present

## 2022-05-02 DIAGNOSIS — Z87891 Personal history of nicotine dependence: Secondary | ICD-10-CM | POA: Diagnosis not present

## 2022-05-02 DIAGNOSIS — N210 Calculus in bladder: Secondary | ICD-10-CM | POA: Insufficient documentation

## 2022-05-02 DIAGNOSIS — G8929 Other chronic pain: Secondary | ICD-10-CM | POA: Diagnosis not present

## 2022-05-02 DIAGNOSIS — J449 Chronic obstructive pulmonary disease, unspecified: Secondary | ICD-10-CM | POA: Diagnosis not present

## 2022-05-02 DIAGNOSIS — I10 Essential (primary) hypertension: Secondary | ICD-10-CM | POA: Insufficient documentation

## 2022-05-02 DIAGNOSIS — Z09 Encounter for follow-up examination after completed treatment for conditions other than malignant neoplasm: Secondary | ICD-10-CM | POA: Diagnosis not present

## 2022-05-02 HISTORY — DX: Hydrocele, unspecified: N43.3

## 2022-05-02 HISTORY — DX: Other ill-defined heart diseases: I51.89

## 2022-05-02 HISTORY — PX: CYSTOSCOPY/URETEROSCOPY/HOLMIUM LASER/STENT PLACEMENT: SHX6546

## 2022-05-02 HISTORY — PX: CYSTOSCOPY WITH URETHRAL DILATATION: SHX5125

## 2022-05-02 HISTORY — PX: CYSTOSCOPY WITH LITHOLAPAXY: SHX1425

## 2022-05-02 HISTORY — DX: Calculus of kidney: N20.0

## 2022-05-02 HISTORY — DX: Unspecified urethral stricture, male, unspecified site: N35.919

## 2022-05-02 HISTORY — DX: Atherosclerotic heart disease of native coronary artery without angina pectoris: I25.10

## 2022-05-02 SURGERY — CYSTOSCOPY/URETEROSCOPY/HOLMIUM LASER/STENT PLACEMENT
Anesthesia: General | Site: Urethra | Laterality: Right

## 2022-05-02 MED ORDER — NOREPINEPHRINE BITARTRATE 1 MG/ML IV SOLN
INTRAVENOUS | Status: DC | PRN
  Administered 2022-05-02 (×4): 1 mL via INTRAVENOUS

## 2022-05-02 MED ORDER — OXYCODONE HCL 5 MG PO TABS: 5.0000 mg | ORAL_TABLET | Freq: Once | ORAL | Status: DC | PRN

## 2022-05-02 MED ORDER — ACETAMINOPHEN 10 MG/ML IV SOLN: 1000.0000 mg | Freq: Once | INTRAVENOUS | Status: DC | PRN

## 2022-05-02 MED ORDER — HYDROCODONE-ACETAMINOPHEN 5-325 MG PO TABS: 1.0000 | ORAL_TABLET | ORAL | 0 refills | Status: AC | PRN

## 2022-05-02 MED ORDER — MIDAZOLAM HCL 2 MG/2ML IJ SOLN
INTRAMUSCULAR | Status: AC
  Filled 2022-05-02: qty 2

## 2022-05-02 MED ORDER — CHLORHEXIDINE GLUCONATE 0.12 % MT SOLN
OROMUCOSAL | Status: AC
  Administered 2022-05-02: 15 mL via OROMUCOSAL
  Filled 2022-05-02: qty 15

## 2022-05-02 MED ORDER — CEFAZOLIN SODIUM-DEXTROSE 2-4 GM/100ML-% IV SOLN
INTRAVENOUS | Status: AC
  Filled 2022-05-02: qty 100

## 2022-05-02 MED ORDER — PROPOFOL 10 MG/ML IV BOLUS
INTRAVENOUS | Status: DC | PRN
  Administered 2022-05-02: 30 mg via INTRAVENOUS
  Administered 2022-05-02: 150 mg via INTRAVENOUS

## 2022-05-02 MED ORDER — FENTANYL CITRATE (PF) 100 MCG/2ML IJ SOLN
INTRAMUSCULAR | Status: AC
  Filled 2022-05-02: qty 2

## 2022-05-02 MED ORDER — IOHEXOL 180 MG/ML  SOLN
INTRAMUSCULAR | Status: DC | PRN
  Administered 2022-05-02: 10 mL

## 2022-05-02 MED ORDER — ALBUTEROL SULFATE HFA 108 (90 BASE) MCG/ACT IN AERS
INHALATION_SPRAY | RESPIRATORY_TRACT | Status: DC | PRN
  Administered 2022-05-02: 6 via RESPIRATORY_TRACT
  Administered 2022-05-02: 4 via RESPIRATORY_TRACT

## 2022-05-02 MED ORDER — GLYCOPYRROLATE 0.2 MG/ML IJ SOLN
INTRAMUSCULAR | Status: DC | PRN
  Administered 2022-05-02: .2 mg via INTRAVENOUS

## 2022-05-02 MED ORDER — LIDOCAINE HCL (CARDIAC) PF 100 MG/5ML IV SOSY
PREFILLED_SYRINGE | INTRAVENOUS | Status: DC | PRN
  Administered 2022-05-02: 80 mg via INTRAVENOUS

## 2022-05-02 MED ORDER — SODIUM CHLORIDE 0.9 % IR SOLN
Status: DC | PRN
  Administered 2022-05-02: 6000 mL

## 2022-05-02 MED ORDER — MIDAZOLAM HCL 2 MG/2ML IJ SOLN
INTRAMUSCULAR | Status: DC | PRN
  Administered 2022-05-02: 2 mg via INTRAVENOUS

## 2022-05-02 MED ORDER — ALBUTEROL SULFATE HFA 108 (90 BASE) MCG/ACT IN AERS
INHALATION_SPRAY | RESPIRATORY_TRACT | Status: AC
  Filled 2022-05-02: qty 6.7

## 2022-05-02 MED ORDER — FENTANYL CITRATE (PF) 100 MCG/2ML IJ SOLN
INTRAMUSCULAR | Status: DC | PRN
  Administered 2022-05-02: 100 ug via INTRAVENOUS

## 2022-05-02 MED ORDER — OXYCODONE HCL 5 MG/5ML PO SOLN: 5.0000 mg | Freq: Once | ORAL | Status: DC | PRN

## 2022-05-02 MED ORDER — LACTATED RINGERS IV SOLN: INTRAVENOUS | Status: DC

## 2022-05-02 MED ORDER — FENTANYL CITRATE (PF) 100 MCG/2ML IJ SOLN: 25.0000 ug | INTRAMUSCULAR | Status: DC | PRN

## 2022-05-02 MED ORDER — SUGAMMADEX SODIUM 200 MG/2ML IV SOLN
INTRAVENOUS | Status: DC | PRN
  Administered 2022-05-02: 200 mg via INTRAVENOUS

## 2022-05-02 MED ORDER — ONDANSETRON HCL 4 MG/2ML IJ SOLN: 4.0000 mg | Freq: Once | INTRAMUSCULAR | Status: DC | PRN

## 2022-05-02 MED ORDER — EPHEDRINE SULFATE (PRESSORS) 50 MG/ML IJ SOLN
INTRAMUSCULAR | Status: DC | PRN
  Administered 2022-05-02: 10 mg via INTRAVENOUS

## 2022-05-02 MED ORDER — ROCURONIUM BROMIDE 100 MG/10ML IV SOLN
INTRAVENOUS | Status: DC | PRN
  Administered 2022-05-02: 35 mg via INTRAVENOUS

## 2022-05-02 SURGICAL SUPPLY — 59 items
ADH LQ OCL WTPRF AMP STRL LF (MISCELLANEOUS)
ADHESIVE MASTISOL STRL (MISCELLANEOUS) IMPLANT
BAG DRAIN SIEMENS DORNER NS (MISCELLANEOUS) ×3 IMPLANT
BAG DRN NS LF (MISCELLANEOUS) ×3
BAG DRN RND TRDRP ANRFLXCHMBR (UROLOGICAL SUPPLIES) ×3
BAG PRESSURE INF REUSE 3000 (BAG) ×3 IMPLANT
BAG URINE DRAIN 2000ML AR STRL (UROLOGICAL SUPPLIES) ×3 IMPLANT
BALLN NEPHROMAX 24X8X12 (BALLOONS)
BALLN OPTILUME DCB 24X5X75 (BALLOONS)
BALLN OPTILUME DCB 30X3X75 (BALLOONS)
BALLN OPTILUME DCB 30X5X75 (BALLOONS)
BALLN URETL DIL 7X10 (BALLOONS)
BALLN URETL DIL 7X4 (MISCELLANEOUS)
BALLOON NEPHROMAX 24X8X12 (BALLOONS) IMPLANT
BALLOON OPTILUME DCB 24X5X75 (BALLOONS) IMPLANT
BALLOON OPTILUME DCB 30X3X75 (BALLOONS) IMPLANT
BALLOON OPTILUME DCB 30X5X75 (BALLOONS) IMPLANT
BALLOON URETL DIL 7X10 (BALLOONS) IMPLANT
BALLOON URETL DIL 7X4 (MISCELLANEOUS) IMPLANT
BRUSH SCRUB EZ 1% IODOPHOR (MISCELLANEOUS) ×3 IMPLANT
CATH FOL 2WAY LX 16X5 (CATHETERS) IMPLANT
CATH FOLEY 2W COUNCIL 5CC 16FR (CATHETERS) ×3 IMPLANT
CATH FOLEY 2W COUNCIL 5CC 18FR (CATHETERS) IMPLANT
CATH SET URETHRAL DILATOR (CATHETERS) IMPLANT
CATH URET FLEX-TIP 2 LUMEN 10F (CATHETERS) IMPLANT
CATH URETL OPEN 5X70 (CATHETERS) IMPLANT
CNTNR URN SCR LID CUP LEK RST (MISCELLANEOUS) IMPLANT
CONT SPEC 4OZ STRL OR WHT (MISCELLANEOUS)
DRAPE UTILITY 15X26 TOWEL STRL (DRAPES) ×3 IMPLANT
DRSG TEGADERM 2-3/8X2-3/4 SM (GAUZE/BANDAGES/DRESSINGS) IMPLANT
ELECT REM PT RETURN 9FT ADLT (ELECTROSURGICAL)
ELECTRODE REM PT RTRN 9FT ADLT (ELECTROSURGICAL) IMPLANT
FIBER LASER MOSES 200 DFL (Laser) ×3 IMPLANT
FIBER LASER MOSES 365 DFL (Laser) IMPLANT
GAUZE 4X4 16PLY ~~LOC~~+RFID DBL (SPONGE) ×6 IMPLANT
GLOVE BIOGEL PI IND STRL 7.5 (GLOVE) ×3 IMPLANT
GOWN STRL REUS W/ TWL LRG LVL3 (GOWN DISPOSABLE) ×3 IMPLANT
GOWN STRL REUS W/ TWL XL LVL3 (GOWN DISPOSABLE) ×3 IMPLANT
GOWN STRL REUS W/TWL LRG LVL3 (GOWN DISPOSABLE) ×3
GOWN STRL REUS W/TWL XL LVL3 (GOWN DISPOSABLE) ×3
GUIDEWIRE STR DUAL SENSOR (WIRE) ×3 IMPLANT
IV NS IRRIG 3000ML ARTHROMATIC (IV SOLUTION) ×3 IMPLANT
KIT PROBE TRILOGY 3.9X350 (MISCELLANEOUS) IMPLANT
KIT TURNOVER CYSTO (KITS) ×3 IMPLANT
MANIFOLD NEPTUNE II (INSTRUMENTS) ×3 IMPLANT
PACK CYSTO AR (MISCELLANEOUS) ×3 IMPLANT
SET CYSTO W/LG BORE CLAMP LF (SET/KITS/TRAYS/PACK) ×3 IMPLANT
SHEATH NAVIGATOR HD 12/14X36 (SHEATH) IMPLANT
STENT URET 6FRX24 CONTOUR (STENTS) IMPLANT
STENT URET 6FRX26 CONTOUR (STENTS) IMPLANT
SURGILUBE 2OZ TUBE FLIPTOP (MISCELLANEOUS) ×3 IMPLANT
SYR 10ML LL (SYRINGE) ×3 IMPLANT
SYR 30ML LL (SYRINGE) ×3 IMPLANT
SYR TOOMEY IRRIG 70ML (MISCELLANEOUS) ×3
SYRINGE TOOMEY IRRIG 70ML (MISCELLANEOUS) ×3 IMPLANT
TRAP FLUID SMOKE EVACUATOR (MISCELLANEOUS) ×3 IMPLANT
VALVE UROSEAL ADJ ENDO (VALVE) IMPLANT
WATER STERILE IRR 1000ML POUR (IV SOLUTION) ×3 IMPLANT
WATER STERILE IRR 500ML POUR (IV SOLUTION) ×3 IMPLANT

## 2022-05-02 NOTE — Transfer of Care (Signed)
Immediate Anesthesia Transfer of Care Note  Patient: Victor Cortez  Procedure(s) Performed: CYSTOSCOPY/URETEROSCOPY/HOLMIUM LASER/STENT PLACEMENT/RETROGRADE WITH PYLOGRAM (Right: Ureter) CYSTOSCOPY WITH LITHOLAPAXY (Bladder) CYSTOSCOPY  URETHRAL DILATATION (Urethra)  Patient Location: PACU  Anesthesia Type:General  Level of Consciousness: awake, alert , and oriented  Airway & Oxygen Therapy: Patient Spontanous Breathing and Patient connected to face mask oxygen  Post-op Assessment: Report given to RN and Post -op Vital signs reviewed and stable  Post vital signs: stable  Last Vitals:  Vitals Value Taken Time  BP 152/73 05/02/22 1401  Temp 36.4 C 05/02/22 1400  Pulse 78 05/02/22 1407  Resp 18 05/02/22 1407  SpO2 99 % 05/02/22 1407  Vitals shown include unvalidated device data.  Last Pain:  Vitals:   05/02/22 1400  TempSrc:   PainSc: 0-No pain         Complications: No notable events documented.

## 2022-05-02 NOTE — Discharge Instructions (Signed)

## 2022-05-02 NOTE — H&P (Signed)
05/02/22 12:43 PM   Victor Cortez [birth year] [id number]  CC: Right ureteral stone, worsening renal function, bladder stones, history of urethral stricture  HPI:  72 year old male I saw previously for left-sided ureteral stones who underwent ureteroscopy in 2019, at that time he was also found to have a fossa navicularis stricture and underwent serial dilation.  He opted for follow-up as needed.   He was re-referred for recent finding of a large 1.2 cm right mid ureteral stone with severe hydronephrosis and right renal atrophy, in addition to a 2 cm Bosniak 37F cyst in the left kidney seen on recent CT abdomen pelvis.  I personally viewed and interpreted those images, also with multiple 1 cm bladder stones.   He really denies any pain aside from some chronic lower back pain.  He does report foul-smelling urine, denies any dysuria or fever/chills, no gross hematuria.  He has some dribbling stream that is baseline but really denies any significant urinary complaints.   Urinalysis appeared infected and he was started on antibiotics, culture has thus far showed only 400 colonies mixed flora.We reviewed his CT findings of a large right mid ureteral stone with hydronephrosis and renal atrophy, we discussed risk of long-term renal damage, as well as recurrent infections or pyelonephritis/sepsis.  We also reviewed the bladder stones, unclear if these are from chronic incomplete emptying, or ureteral stones that have just passed into the bladder.  With his minimal urinary symptoms, I do not think we need to jump straight outlet procedures at this time, especially with his history of stricture, but could consider Optilume balloon dilation if any stricture found at the time of ureteroscopy.  I recommended cystoscopy, cystolitholapaxy, right ureteroscopy, laser lithotripsy to address both his bladder stones and his large right mid ureteral stone.   We specifically discussed the risks ureteroscopy including  bleeding, infection/sepsis, stent related symptoms including flank pain/urgency/frequency/incontinence/dysuria, ureteral injury, inability to access stone, or need for staged or additional procedures.  PMH: Past Medical History:  Diagnosis Date   Aortic atherosclerosis ([place])    Arthritis    Atrial fibrillation with RVR ([place]) 06/2013   a.) CHA2DS2VASc = 3 (age, HTN, vascular disease history); b.) rate/rhythm maintained on oral sotalol; no chronic anticoagulation   Basal cell carcinoma of nose 1998   Cholelithiasis 10/2017   COPD (chronic obstructive pulmonary disease) (HCC)    Coronary artery calcification seen on CT scan    Diastolic dysfunction    a.) TTE 07/29/2021: EF >55%, mild LVH, mild-mod MR, G1DD   ED (erectile dysfunction)    a.) on PDE5i (sildenafil) PRN   Ex-smoker    GERD (gastroesophageal reflux disease)    Glaucoma    Hepatic steatosis    History of bilateral cataract extraction 07/2020   History of chicken pox    History of pneumonia 06/2013   with sepsis and [facility] hospitalization   Hyperlipidemia    Hypertension    Nephrolithiasis    Right hydrocele    Rosacea    Seborrheic dermatitis    Urethral stricture    Wears dentures    full upper, partial lower    Surgical History: Past Surgical History:  Procedure Laterality Date   CATARACT EXTRACTION W/PHACO Left 07/10/2020   Procedure: CATARACT EXTRACTION PHACO AND INTRAOCULAR LENS PLACEMENT ([place]) LEFT VIVITY TORIC 9.00 00:51.4;  Surgeon: Birder Robson, MD;  Location: [place];  Service: Ophthalmology;  Laterality: Left;   CATARACT EXTRACTION W/PHACO Right 07/24/2020   Procedure: CATARACT EXTRACTION PHACO AND  INTRAOCULAR LENS PLACEMENT ([facility]) RIGHT;  Surgeon: Birder Robson, MD;  Location: [place];  Service: Ophthalmology;  Laterality: Right;  6.65 0:41.0   CYSTOSCOPY WITH URETHRAL DILATATION  12/04/2017   Procedure: CYSTOSCOPY WITH URETHRAL DILATATION;  Surgeon: Billey Co, MD;   Location: [facility];  Service: Urology;;   CYSTOSCOPY/URETEROSCOPY/HOLMIUM LASER/STENT PLACEMENT Left 12/04/2017   Procedure: CYSTOSCOPY/URETEROSCOPY/HOLMIUM LASER/STENT PLACEMENT;  Surgeon: Billey Co, MD;  Location: [facility];  Service: Urology;  Laterality: Left;   ESOPHAGOGASTRODUODENOSCOPY (EGD) WITH PROPOFOL N/A 12/21/2018   normal esophagus, normal stomach, peptic duodenitis - done for 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 His smoking use included cigarettes. He has a 106.00 pack-year smoking history. He has never used smokeless tobacco. He reports that he does not drink alcohol and does not use drugs.  Physical Exam: BP 136/80 (BP Location: Left Arm)   Pulse 61   Temp 98 F (36.7 C) (Oral)   Resp 20   Ht 5\' 6"  (1.676 m)   Wt 72.6 kg   BMI 25.82 kg/m    Constitutional:  Alert and oriented, No acute distress. Cardiovascular: Regular rate and rhythm Respiratory: There to auscultation bilaterally g. GI: Abdomen is soft, nontender, nondistended, no abdominal masses   Laboratory Data: Urinalysis appeared infected, started on antibiotics, cultures thus far 400 colonies mixed flora Creatinine 1.42 from baseline 1.19  Assessment & Plan:   72 year old male with minimally symptomatic large 1 cm right mid ureteral stone with hydronephrosis and renal atrophy, worsening renal function, as well as multiple bladder stones.  We specifically discussed the risks ureteroscopy including bleeding, infection/sepsis, stent related symptoms including flank pain/urgency/frequency/incontinence/dysuria, ureteral injury, inability to access stone, or need for  staged or additional procedures.  Cystoscopy, cystolitholapaxy, possible urethral dilation/Optilume balloon, right ureteroscopy, laser lithotripsy, stent placement   Nickolas Madrid, MD 05/02/2022  Rafael Gonzalez [address] [place], [place] [address] [phone]

## 2022-05-02 NOTE — Op Note (Signed)
Date of procedure: 05/02/22  Preoperative diagnosis:  Bladder stones Right ureteral stone Urethral stricture  Postoperative diagnosis:  Same  Procedure: Cystolitholapaxy(1.5cm) Urethral dilation Right ureteroscopy, laser lithotripsy, right retrograde pyelogram with intraoperative interpretation, right ureteral stent placement  Surgeon: Nickolas Madrid, MD  Anesthesia: General  Complications: None  Intraoperative findings:  Diffusely narrow urethra but able to advance 21 [place] scope into the bladder with mild resistance Small prostate, mild bladder trabeculations, 2 bladder stones, largest 1.5 cm.  Bladder stones fragmented and irrigated free Urethral dilation performed up to 24 French over the wire Completely impacted 1 cm right mid ureteral stone dusted into fragments irrigated free, stent placed  EBL: Minimal  Specimens: None  Drains: Right 6 French by 26 cm ureteral stent, 18 French Foley  Indication: Victor Cortez is a 72 y.o. patient found to have a 1 cm right mid ureteral stone on CT with worsening renal function as well as multiple bladder stones.  After reviewing the management options for treatment, they elected to proceed with the above surgical procedure(s). We have discussed the potential benefits and risks of the procedure, side effects of the proposed treatment, the likelihood of the patient achieving the goals of the procedure, and any potential problems that might occur during the procedure or recuperation. Informed consent has been obtained.  Description of procedure:  The patient was taken to the operating room and general anesthesia was induced. SCDs were placed for DVT prophylaxis. The patient was placed in the dorsal lithotomy position, prepped and draped in the usual sterile fashion, and preoperative antibiotics(Ancef) were administered. A preoperative time-out was performed.   30 French rigid cystoscope was used to intubate the urethra.  The urethra was  narrowed diffusely but I was able to navigate the scope with some gentle pressure into the bladder.  The prostate was small.  There were mild to moderate bladder trabeculations and 2 stones in the bladder, the largest measuring 1.5 cm.  A sensor wire was passed into the bladder and scope removed.  Serial dilation using Hayman dilators was performed from 31 through 24 [place].  The manual stone crusher was then inserted through the rigid cystoscope, and the bladder stones fragmented to smaller pieces and irrigated free from the bladder.  A sensor wire was advanced into the right ureteral orifice and despite numerous attempts with the 5 French access catheter was unable to navigate this past the large stone in the mid ureter to the kidney.  The wire was advanced to just below the level of the stone and a semirigid long ureteroscope was advanced alongside the wire.  There was a completely impacted yellow and black stone in the mid ureter.  A 360 m laser fiber on settings of 1.0 J and 10 Hz was used to methodically and very carefully fragment the stone until the true lumen was identified.  At this point a wire was placed up into the kidney under direct vision, and the semirigid scope removed and then passed back into the ureter alongside the wire.  I continued fragmentation of the stone and this appeared to be quite embedded within the wall of the ureter.  All fragments were dusted to smaller than the laser fiber, and were irrigated free from the ureter.  A retrograde pyelogram was performed from the proximal ureter which showed mild hydronephrosis.  Careful pullback ureteroscopy showed no residual fragments, and significant erythema and edema in the ureteral wall where the stone had been impacted.  The rigid cystoscope was  backloaded over the wire and a 6 [place] by 26 cm ureteral stent was uneventfully placed with a curl in the renal pelvis as well as under direct vision in the bladder.  The bladder was again  irrigated free and there were no residual stones or debris.  The wire was replaced into the bladder and an 72 [place] council Foley passed into the bladder with return of faint pink urine, irrigated easily.  10 mL were placed in the balloon.  Disposition: Stable to PACU  Plan: Schedule Foley removal early next week Stent removal in~2-3 weeks  Nickolas Madrid, MD

## 2022-05-02 NOTE — Anesthesia Postprocedure Evaluation (Signed)
Anesthesia Post Note  Patient: Victor Cortez  Procedure(s) Performed: CYSTOSCOPY/URETEROSCOPY/HOLMIUM LASER/STENT PLACEMENT/RETROGRADE WITH PYLOGRAM (Right: Ureter) CYSTOSCOPY WITH LITHOLAPAXY (Bladder) CYSTOSCOPY  URETHRAL DILATATION (Urethra)  Patient location during evaluation: PACU Anesthesia Type: General Level of consciousness: awake and alert, oriented and patient cooperative Pain management: pain level controlled Vital Signs Assessment: post-procedure vital signs reviewed and stable Respiratory status: spontaneous breathing, nonlabored ventilation and respiratory function stable Cardiovascular status: blood pressure returned to baseline and stable Postop Assessment: adequate PO intake Anesthetic complications: no   There were no known notable events for this encounter.   Last Vitals:  Vitals:   05/02/22 1430 05/02/22 1431  BP:  139/80  Pulse:  73  Resp:  19  Temp: (!) 36.3 C   SpO2:  93%    Last Pain:  Vitals:   05/02/22 1431  TempSrc:   PainSc: 0-No pain                 Darrin Nipper

## 2022-05-02 NOTE — Anesthesia Preprocedure Evaluation (Addendum)
Anesthesia Evaluation  Patient identified by MRN, date of birth, ID band Patient awake    Reviewed: Allergy & Precautions, NPO status , Patient's Chart, lab work & pertinent test results  History of Anesthesia Complications Negative for: history of anesthetic complications  Airway Mallampati: I   Neck ROM: Full    Dental  (+) Upper Dentures, Partial Lower   Pulmonary COPD, former smoker (quit 2015) Hx PTX 03/2019   Pulmonary exam normal breath sounds clear to auscultation       Cardiovascular hypertension, + CAD  Normal cardiovascular exam+ dysrhythmias (a fib)  Rhythm:Regular Rate:Normal  ECG 04/30/22: normal  TRANSTHORACIC ECHOCARDIOGRAM performed on 07/29/21: 1. Normal left ventricular systolic function with an EF of >55% 2. Mild LVH 3. No regional wall motion abnormalities 4. Left ventricular diastolic Doppler parameters consistent with abnormal relaxation (G1DD). 5. Normal right ventricular systolic function 6. Normal pulmonary artery pressure 7. Mild to moderate mitral valve regurgitation 8. Normal gradients; no valvular stenosis 9. No pericardial effusion   MYOCARDIAL PERFUSION 05/01/20: 1. Normal left ventricular systolic function with a hyperdynamic LVEF of 74% 2. Normal regional wall motion 3. No evidence of stress-induced myocardial ischemia or arrhythmia; no scintigraphic evidence of scar 4. Duke treadmill score 6 5. Able to achieve 7 METS following 6 minutes and 2 seconds of exercise 6. Study determined to be normal and low risk.    Neuro/Psych negative neurological ROS     GI/Hepatic ,GERD  ,,  Endo/Other  negative endocrine ROS    Renal/GU Renal disease (nephrolithiasis)     Musculoskeletal  (+) Arthritis ,    Abdominal   Peds  Hematology negative hematology ROS (+)   Anesthesia Other Findings Reviewed and agree with Bayard Males pre-anesthesia clinical review note.    Reproductive/Obstetrics                             Anesthesia Physical Anesthesia Plan  ASA: 3  Anesthesia Plan: General   Post-op Pain Management:    Induction: Intravenous  PONV Risk Score and Plan: 2 and Ondansetron, Dexamethasone and Treatment may vary due to age or medical condition  Airway Management Planned: Oral ETT  Additional Equipment:   Intra-op Plan:   Post-operative Plan: Extubation in OR  Informed Consent: I have reviewed the patients History and Physical, chart, labs and discussed the procedure including the risks, benefits and alternatives for the proposed anesthesia with the patient or authorized representative who has indicated his/her understanding and acceptance.     Dental advisory given  Plan Discussed with: CRNA  Anesthesia Plan Comments: (Patient consented for risks of anesthesia including but not limited to:  - adverse reactions to medications - damage to eyes, teeth, lips or other oral mucosa - nerve damage due to positioning  - sore throat or hoarseness - damage to heart, brain, nerves, lungs, other parts of body or loss of life  Informed patient about role of CRNA in peri- and intra-operative care.  Patient voiced understanding.)        Anesthesia Quick Evaluation

## 2022-05-02 NOTE — Anesthesia Procedure Notes (Signed)
Procedure Name: Intubation Date/Time: 05/02/2022 1:05 PM  Performed by: Patience Musca., CRNAPre-anesthesia Checklist: Patient identified, Patient being monitored, Timeout performed, Emergency Drugs available and Suction available Patient Re-evaluated:Patient Re-evaluated prior to induction Oxygen Delivery Method: Circle system utilized Preoxygenation: Pre-oxygenation with 100% oxygen Induction Type: IV induction Ventilation: Mask ventilation without difficulty Laryngoscope Size: McGraph and 4 Grade View: Grade I Tube type: Oral Tube size: 7.5 mm Number of attempts: 1 Airway Equipment and Method: Stylet Placement Confirmation: ETT inserted through vocal cords under direct vision, positive ETCO2 and breath sounds checked- equal and bilateral Secured at: 21 cm Tube secured with: Tape Dental Injury: Teeth and Oropharynx as per pre-operative assessment

## 2022-05-03 ENCOUNTER — Encounter: Payer: Self-pay | Admitting: Urology

## 2022-05-05 ENCOUNTER — Ambulatory Visit (INDEPENDENT_AMBULATORY_CARE_PROVIDER_SITE_OTHER): Payer: Medicare Other | Admitting: Urology

## 2022-05-05 DIAGNOSIS — N35919 Unspecified urethral stricture, male, unspecified site: Secondary | ICD-10-CM

## 2022-05-05 LAB — CULTURE, URINE COMPREHENSIVE

## 2022-05-05 NOTE — Progress Notes (Signed)
Urological history 1. Hydrocele/spermatocele -scrotal US (2016)  large right scrotal slightly complex fluid collection measuring 12.9 x 7.7 x 13.2 cm is noted. This may represent a large spermatocele  2. Epididymal cysts -scrotal US (2016) - Small bilateral epididymal cysts   3. High risk hematuria -former smoker -CTU (2019) - nephrolithiasis -cysto/left urs (2019) - significant fossa navicularis stricture  4. ED -Contributing factors of age, history of smoking, BPH, CAD, COPD and HTN -Sildenafil 20 mg, on-demand dosing  5. Urethral stricture -s/p urethral dilation w/ Michel Harrow dilators    Catheter Removal  Patient is present today for a catheter removal.  10 ml of water was drained from the balloon. A 18 FR council tip foley cath was removed from the bladder, no complications were noted. Patient tolerated well.  Performed by: Zara Council, PA-C   Follow up/ Additional notes: He stated the catheter was really bothering him this time, but it appeared that the leg bag was clogged with clots.  Hopefully, now that the catheter is out, he can void without issue.  Return precautions reviewed.  He will follow up as scheduled for cysto/stent removal.  Advised that he may shower but not soak in tubs.

## 2022-05-07 ENCOUNTER — Other Ambulatory Visit: Payer: Self-pay

## 2022-05-07 DIAGNOSIS — E782 Mixed hyperlipidemia: Secondary | ICD-10-CM

## 2022-05-08 MED ORDER — ROSUVASTATIN CALCIUM 40 MG PO TABS: 40.0000 mg | ORAL_TABLET | Freq: Every day | ORAL | 0 refills | Status: DC

## 2022-05-19 ENCOUNTER — Other Ambulatory Visit: Payer: Self-pay | Admitting: Pulmonary Disease

## 2022-05-22 ENCOUNTER — Other Ambulatory Visit: Payer: Self-pay | Admitting: Cardiovascular Disease

## 2022-05-22 ENCOUNTER — Ambulatory Visit: Payer: Medicare Other | Admitting: Urology

## 2022-05-22 ENCOUNTER — Encounter: Payer: Self-pay | Admitting: Urology

## 2022-05-22 VITALS — BP 109/72 | HR 65 | Ht 66.0 in | Wt 159.0 lb

## 2022-05-22 DIAGNOSIS — Z466 Encounter for fitting and adjustment of urinary device: Secondary | ICD-10-CM | POA: Diagnosis not present

## 2022-05-22 DIAGNOSIS — N201 Calculus of ureter: Secondary | ICD-10-CM | POA: Diagnosis not present

## 2022-05-22 MED ORDER — SULFAMETHOXAZOLE-TRIMETHOPRIM 800-160 MG PO TABS
1.0000 | ORAL_TABLET | Freq: Once | ORAL | Status: AC
  Administered 2022-05-22: 1 via ORAL

## 2022-05-22 NOTE — Progress Notes (Signed)
Cystoscopy Procedure Note:  Indication: Stent removal s/p 05/02/2022 urethral dilation, cystolitholapaxy, right ureteroscopy for impacted 1 cm right ureteral stone  Bactrim given for prophylaxis  After informed consent and discussion of the procedure and its risks, Victor Cortez was positioned and prepped in the standard fashion. Cystoscopy was performed with a flexible cystoscope.  The urethra was narrow but accommodated the flexible scope.  The stent was grasped with flexible graspers and removed in its entirety. The patient tolerated the procedure well.  Findings: Uncomplicated stent removal  Assessment and Plan: RTC 8 weeks with renal ultrasound prior and PVR, anticipate may have a degree of chronic right hydronephrosis with prolonged obstruction and impacted stone  Sondra Come, MD 05/22/2022

## 2022-06-02 ENCOUNTER — Other Ambulatory Visit: Payer: Self-pay | Admitting: Cardiovascular Disease

## 2022-07-07 ENCOUNTER — Other Ambulatory Visit: Payer: Self-pay | Admitting: Cardiovascular Disease

## 2022-07-07 DIAGNOSIS — E782 Mixed hyperlipidemia: Secondary | ICD-10-CM

## 2022-07-08 ENCOUNTER — Encounter: Payer: Self-pay | Admitting: Cardiovascular Disease

## 2022-07-08 ENCOUNTER — Ambulatory Visit (INDEPENDENT_AMBULATORY_CARE_PROVIDER_SITE_OTHER): Payer: Medicare Other | Admitting: Cardiovascular Disease

## 2022-07-08 VITALS — BP 128/76 | HR 67 | Ht 66.0 in | Wt 155.0 lb

## 2022-07-08 DIAGNOSIS — I2510 Atherosclerotic heart disease of native coronary artery without angina pectoris: Secondary | ICD-10-CM | POA: Diagnosis not present

## 2022-07-08 DIAGNOSIS — E782 Mixed hyperlipidemia: Secondary | ICD-10-CM

## 2022-07-08 DIAGNOSIS — I482 Chronic atrial fibrillation, unspecified: Secondary | ICD-10-CM

## 2022-07-08 NOTE — Assessment & Plan Note (Signed)
Denies chest pain. Gets short of breath on exertion patient relates to COPD. CCTA 03/2015 score 56, Mild mid RCA, mild distal LCX disease.  No significant disease in LAD. Normal stress test 04/2020.

## 2022-07-08 NOTE — Assessment & Plan Note (Signed)
On rosuvastatin, continue 

## 2022-07-08 NOTE — Assessment & Plan Note (Signed)
In sinus rhythm on auscultation. Continue sotalol.

## 2022-07-08 NOTE — Progress Notes (Signed)
Cardiology Office Note   Date:  07/08/2022   ID:  Victor Cortez, DOB 1950-04-23, MRN [id number]  PCP:  Eustaquio Boyden, MD  Cardiologist:  Adrian Blackwater, MD      History of Present Illness: Victor Cortez is a 73 y.o. male who presents for  Chief Complaint  Patient presents with   Follow-up    3 month follow up    Patient in office for routine cardiac exam. Denies chest pain, edema, palpitations. Shortness of breath in exertion.     Past Medical History:  Diagnosis Date   Aortic atherosclerosis (HCC)    Arthritis    Atrial fibrillation with RVR (HCC) 06/2013   a.) CHA2DS2VASc = 3 (age, HTN, vascular disease history); b.) rate/rhythm maintained on oral sotalol; no chronic anticoagulation   Basal cell carcinoma of nose 1998   Cholelithiasis 10/2017   COPD (chronic obstructive pulmonary disease) (HCC)    Coronary artery calcification seen on CT scan    Diastolic dysfunction    a.) TTE 07/29/2021: EF >55%, mild LVH, mild-mod MR, G1DD   ED (erectile dysfunction)    a.) on PDE5i (sildenafil) PRN   Ex-smoker    GERD (gastroesophageal reflux disease)    Glaucoma    Hepatic steatosis    History of bilateral cataract extraction 07/2020   History of chicken pox    History of pneumonia 06/2013   with sepsis and [facility] hospitalization   Hyperlipidemia    Hypertension    Nephrolithiasis    Right hydrocele    Rosacea    Seborrheic dermatitis    Urethral stricture    Wears dentures    full upper, partial lower     Past Surgical History:  Procedure Laterality Date   CATARACT EXTRACTION W/PHACO Left 07/10/2020   Procedure: CATARACT EXTRACTION PHACO AND INTRAOCULAR LENS PLACEMENT (IOC) LEFT VIVITY TORIC 9.00 00:51.4;  Surgeon: Galen Manila, MD;  Location: [facility];  Service: Ophthalmology;  Laterality: Left;   CATARACT EXTRACTION W/PHACO Right 07/24/2020   Procedure: CATARACT EXTRACTION PHACO AND INTRAOCULAR LENS PLACEMENT (IOC) RIGHT;  Surgeon: Galen Manila, MD;  Location: [facility];  Service: Ophthalmology;  Laterality: Right;  6.65 0:41.0   CYSTOSCOPY WITH LITHOLAPAXY N/A 05/02/2022   Procedure: CYSTOSCOPY WITH LITHOLAPAXY;  Surgeon: Sondra Come, MD;  Location: [facility];  Service: Urology;  Laterality: N/A;   CYSTOSCOPY WITH URETHRAL DILATATION  12/04/2017   Procedure: CYSTOSCOPY WITH URETHRAL DILATATION;  Surgeon: Sondra Come, MD;  Location: [facility];  Service: Urology;;   CYSTOSCOPY WITH URETHRAL DILATATION N/A 05/02/2022   Procedure: CYSTOSCOPY  URETHRAL DILATATION;  Surgeon: Sondra Come, MD;  Location: [facility];  Service: Urology;  Laterality: N/A;   CYSTOSCOPY/URETEROSCOPY/HOLMIUM LASER/STENT PLACEMENT Left 12/04/2017   Procedure: CYSTOSCOPY/URETEROSCOPY/HOLMIUM LASER/STENT PLACEMENT;  Surgeon: Sondra Come, MD;  Location: [facility];  Service: Urology;  Laterality: Left;   CYSTOSCOPY/URETEROSCOPY/HOLMIUM LASER/STENT PLACEMENT Right 05/02/2022   Procedure: CYSTOSCOPY/URETEROSCOPY/HOLMIUM LASER/STENT PLACEMENT/RETROGRADE WITH [id number];  Surgeon: Sondra Come, MD;  Location: [facility];  Service: Urology;  Laterality: Right;   ESOPHAGOGASTRODUODENOSCOPY (EGD) WITH PROPOFOL N/A 12/21/2018   normal esophagus, normal stomach, peptic duodenitis - 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  8 g 6   amLODipine (NORVASC) 5 MG tablet TAKE 1 TABLET BY MOUTH DAILY 90 tablet 2   aspirin EC 81 MG tablet Take 81 mg by mouth daily. 1 tab by mouth in AM     BREZTRI AEROSPHERE 160-9-4.8 MCG/ACT AERO INHALE 2 PUFFS INTO THE LUNGS IN THE MORNING AND AT BEDTIME 10.7 g 5   brinzolamide (AZOPT) 1 % ophthalmic suspension 1 drop 2 (two) times daily.     calcium  carbonate (TUMS) 500 MG chewable tablet Chew 1 tablet (200 mg of elemental calcium total) by mouth daily as needed for indigestion or heartburn.     Cholecalciferol (VITAMIN D3) 10 MCG (400 UNIT) CAPS Take by mouth daily.     Cyanocobalamin 1000 MCG TBCR Take 1 tablet by mouth daily. One tab by mouth daily     Glucosamine-Chondroit-Vit C-Mn (GLUCOSAMINE 1500 COMPLEX) CAPS Take 1 capsule by mouth daily.     losartan (COZAAR) 100 MG tablet TAKE 1 TABLET BY MOUTH ONCE DAILY 90 tablet 3   Multiple Vitamin (MULTI-VITAMINS) TABS Take by mouth. 1 tab by mouth daily     Omega-3 Fatty Acids (FISH OIL) 1000 MG CAPS Take 1 capsule by mouth daily. 1 cap by mouth daily     omeprazole (PRILOSEC) 40 MG capsule Take 1 capsule (40 mg total) by mouth daily. For 3 weeks then as needed 30 capsule 3   rosuvastatin (CRESTOR) 40 MG tablet TAKE 1 TABLET BY MOUTH ONCE DAILY 90 tablet 0   sildenafil (REVATIO) 20 MG tablet Take 1 tablet (20 mg total) by mouth 3 (three) times daily. 3-5 PRN 30 min prior to intercourse 30 tablet 6   sotalol (BETAPACE) 80 MG tablet TAKE ONE TABLET BY MOUTH TWICE DAILY 180 tablet 0   No current facility-administered medications for this visit.    Allergies:   Xarelto [rivaroxaban]    Social History:   reports that he quit smoking about 9 years ago. His smoking use included cigarettes. He has a 106.00 pack-year smoking history. He has been exposed to tobacco smoke. He has never used smokeless tobacco. He reports that he does not drink alcohol and does not use drugs.   Family History:  family history includes COPD in his father and mother; Cancer in his father.    ROS:     Review of Systems  Constitutional: Negative.   HENT: Negative.    Eyes: Negative.   Respiratory: Negative.    Cardiovascular: Negative.   Gastrointestinal: Negative.   Genitourinary: Negative.   Musculoskeletal: Negative.   Skin: Negative.   Neurological: Negative.   Endo/Heme/Allergies: Negative.    Psychiatric/Behavioral: Negative.    All other systems reviewed and are negative.     All other systems are reviewed and negative.    PHYSICAL EXAM: VS:  BP 128/76   Pulse 67   Ht 5\' 6"  (1.676 m)   Wt 155 lb (70.3 kg)   SpO2 95%   BMI 25.02 kg/m  , BMI Body mass index is 25.02 kg/m. Last weight:  Wt Readings from Last 3 Encounters:  07/08/22 155 lb (70.3 kg)  05/22/22 159 lb (72.1 kg)  05/02/22 159 lb 15.8 oz (72.6 kg)    Physical Exam Vitals reviewed.  Constitutional:      Appearance: Normal appearance. He is normal weight.  HENT:     Head: Normocephalic.     Nose: Nose normal.     Mouth/Throat:     Mouth: Mucous membranes are moist.  Eyes:  Pupils: Pupils are equal, round, and reactive to light.  Cardiovascular:     Rate and Rhythm: Normal rate and regular rhythm.     Pulses: Normal pulses.     Heart sounds: Normal heart sounds.  Pulmonary:     Effort: Pulmonary effort is normal.  Abdominal:     General: Abdomen is flat. Bowel sounds are normal.  Musculoskeletal:        General: Normal range of motion.     Cervical back: Normal range of motion.  Skin:    General: Skin is warm.  Neurological:     General: No focal deficit present.     Mental Status: He is alert.  Psychiatric:        Mood and Affect: Mood normal.      EKG: none today  Recent Labs: 04/28/2022: BUN 16; Creatinine, Ser 1.42; Potassium 5.2; Sodium 143 04/30/2022: Hemoglobin 13.7; Platelets 301    Lipid Panel    Component Value Date/Time   CHOL 202 (H) 11/16/2017 1538   TRIG 221.0 (H) 11/16/2017 1538   TRIG 75 04/28/2013 0000   HDL 40.90 11/16/2017 1538   CHOLHDL 5 11/16/2017 1538   VLDL 44.2 (H) 11/16/2017 1538   LDLCALC 154 (H) 08/31/2015 0900   LDLDIRECT 152.0 11/16/2017 1538     ASSESSMENT AND PLAN:    ICD-10-CM   1. Coronary artery disease involving native coronary artery of native heart without angina pectoris  I25.10     2. Mixed hyperlipidemia  E78.2     3.  Chronic atrial fibrillation (HCC)  I48.20        Problem List Items Addressed This Visit       Cardiovascular and Mediastinum   CAD (coronary artery disease) - Primary    Denies chest pain. Gets short of breath on exertion patient relates to COPD. CCTA 03/2015 score 56, Mild mid RCA, mild distal LCX disease.  No significant disease in LAD. Normal stress test 04/2020.      Chronic atrial fibrillation (HCC)    In sinus rhythm on auscultation. Continue sotalol.         Other   Hyperlipidemia    On rosuvastatin, continue.         Disposition:   Return in about 4 months (around 11/07/2022).    Total time spent: 30 minutes  Signed,  Adrian Blackwater, MD  07/08/2022 10:34 AM    [facility]

## 2022-08-14 ENCOUNTER — Ambulatory Visit
Admission: RE | Admit: 2022-08-14 | Discharge: 2022-08-14 | Disposition: A | Discharge status: Home / Self Care | Payer: Medicare Other | Source: Ambulatory Visit | Attending: Urology | Admitting: Urology

## 2022-08-14 DIAGNOSIS — N201 Calculus of ureter: Secondary | ICD-10-CM | POA: Insufficient documentation

## 2022-08-21 ENCOUNTER — Encounter: Payer: Self-pay | Admitting: Urology

## 2022-08-21 ENCOUNTER — Ambulatory Visit (INDEPENDENT_AMBULATORY_CARE_PROVIDER_SITE_OTHER): Payer: Medicare Other | Admitting: Urology

## 2022-08-21 VITALS — BP 109/71 | HR 71 | Ht 66.0 in | Wt 160.0 lb

## 2022-08-21 DIAGNOSIS — N401 Enlarged prostate with lower urinary tract symptoms: Secondary | ICD-10-CM | POA: Diagnosis not present

## 2022-08-21 DIAGNOSIS — N35919 Unspecified urethral stricture, male, unspecified site: Secondary | ICD-10-CM

## 2022-08-21 DIAGNOSIS — N529 Male erectile dysfunction, unspecified: Secondary | ICD-10-CM | POA: Diagnosis not present

## 2022-08-21 DIAGNOSIS — N200 Calculus of kidney: Secondary | ICD-10-CM

## 2022-08-21 DIAGNOSIS — N281 Cyst of kidney, acquired: Secondary | ICD-10-CM | POA: Diagnosis not present

## 2022-08-21 DIAGNOSIS — Z87442 Personal history of urinary calculi: Secondary | ICD-10-CM | POA: Diagnosis not present

## 2022-08-21 DIAGNOSIS — Z87448 Personal history of other diseases of urinary system: Secondary | ICD-10-CM

## 2022-08-21 MED ORDER — TADALAFIL 20 MG PO TABS: 20.0000 mg | ORAL_TABLET | Freq: Every day | ORAL | 6 refills | Status: DC | PRN

## 2022-08-21 MED ORDER — TAMSULOSIN HCL 0.4 MG PO CAPS: 0.4000 mg | ORAL_CAPSULE | Freq: Every day | ORAL | 3 refills | Status: DC

## 2022-08-21 MED ORDER — TAMSULOSIN HCL 0.4 MG PO CAPS: 0.4000 mg | ORAL_CAPSULE | Freq: Every day | ORAL | 11 refills | Status: DC

## 2022-08-21 NOTE — Progress Notes (Signed)
   08/21/2022 1:42 PM   [name] 11-15-1950 [id number]  Reason for visit: Follow up nephrolithiasis, bladder stones, BPH, ED, right hydrocele, history of urethral stricture, renal cyst  HPI: 72 year old male who underwent right ureteroscopy in March 2024 for a large impacted 1 cm right mid ureteral stone with severe hydronephrosis and renal atrophy.  Also found to have bladder stones at that time that were removed, prostate measured 36 g on CT.  He deferred a simultaneous outlet procedure.  He has done well since that time.  He reports some mild weak stream, and PVR today mildly elevated at .  With his history of bladder stones I encouraged him to trial Flomax to see if we can improve his emptying and he was in agreement.  I personally viewed and interpreted the follow-up renal ultrasound dated 08/14/2022 that shows no hydronephrosis, minimally complex left upper pole renal cyst without change in size.  He also had questions about ED today.  He has used sildenafil with only mixed results and mild improvement.  He thinks Cialis may have been more helpful in the future and would be interested in trying that again.  I sent in Cialis 20 mg on demand and risk and benefits were discussed.  He has a large right hydrocele that continues to be stable and he denies any complaints.  We discussed options including observation or hydrocelectomy as well as the risks and benefits, and he would like to continue with observation alone.  Trial of Flomax for BPH and Cialis for ED RTC 1 year KUB prior, PVR   Sondra Come, MD  [facility] [address] [place], [place] [address] [phone]

## 2022-09-03 ENCOUNTER — Other Ambulatory Visit: Payer: Self-pay | Admitting: Cardiovascular Disease

## 2022-10-03 ENCOUNTER — Other Ambulatory Visit: Payer: Self-pay | Admitting: Cardiovascular Disease

## 2022-10-03 DIAGNOSIS — E782 Mixed hyperlipidemia: Secondary | ICD-10-CM

## 2022-10-04 ENCOUNTER — Other Ambulatory Visit: Payer: Self-pay | Admitting: Pulmonary Disease

## 2022-11-07 ENCOUNTER — Encounter: Payer: Self-pay | Admitting: Cardiovascular Disease

## 2022-11-07 ENCOUNTER — Ambulatory Visit (INDEPENDENT_AMBULATORY_CARE_PROVIDER_SITE_OTHER): Payer: Medicare Other | Admitting: Cardiovascular Disease

## 2022-11-07 VITALS — BP 118/72 | HR 68 | Ht 66.0 in | Wt 162.0 lb

## 2022-11-07 DIAGNOSIS — E782 Mixed hyperlipidemia: Secondary | ICD-10-CM

## 2022-11-07 DIAGNOSIS — R0789 Other chest pain: Secondary | ICD-10-CM

## 2022-11-07 DIAGNOSIS — I2510 Atherosclerotic heart disease of native coronary artery without angina pectoris: Secondary | ICD-10-CM

## 2022-11-07 DIAGNOSIS — I482 Chronic atrial fibrillation, unspecified: Secondary | ICD-10-CM | POA: Diagnosis not present

## 2022-11-07 NOTE — Progress Notes (Signed)
Cardiology Office Note   Date:  11/07/2022   ID:  OTTO SPAGNOLI, DOB 1950-10-29, MRN [id number]  PCP:  Eustaquio Boyden, MD  Cardiologist:  Adrian Blackwater, MD      History of Present Illness: Victor Cortez is a 72 y.o. male who presents for  Chief Complaint  Patient presents with   Follow-up    Had chest pains  Chest Pain  This is a recurrent problem. The current episode started more than 1 month ago. The problem has been waxing and waning. The pain is at a severity of 6/10. The quality of the pain is described as heavy.      Past Medical History:  Diagnosis Date   Aortic atherosclerosis (HCC)    Arthritis    Atrial fibrillation with RVR (HCC) 06/2013   a.) CHA2DS2VASc = 3 (age, HTN, vascular disease history); b.) rate/rhythm maintained on oral sotalol; no chronic anticoagulation   Basal cell carcinoma of nose 1998   Cholelithiasis 10/2017   COPD (chronic obstructive pulmonary disease) (HCC)    Coronary artery calcification seen on CT scan    Diastolic dysfunction    a.) TTE 07/29/2021: EF >55%, mild LVH, mild-mod MR, G1DD   ED (erectile dysfunction)    a.) on PDE5i (sildenafil) PRN   Ex-smoker    GERD (gastroesophageal reflux disease)    Glaucoma    Hepatic steatosis    History of bilateral cataract extraction 07/2020   History of chicken pox    History of pneumonia 06/2013   with sepsis and [facility] hospitalization   Hyperlipidemia    Hypertension    Nephrolithiasis    Right hydrocele    Rosacea    Seborrheic dermatitis    Urethral stricture    Wears dentures    full upper, partial lower     Past Surgical History:  Procedure Laterality Date   CATARACT EXTRACTION W/PHACO Left 07/10/2020   Procedure: CATARACT EXTRACTION PHACO AND INTRAOCULAR LENS PLACEMENT (IOC) LEFT VIVITY TORIC 9.00 00:51.4;  Surgeon: Galen Manila, MD;  Location: [facility];  Service: Ophthalmology;  Laterality: Left;   CATARACT EXTRACTION W/PHACO Right 07/24/2020    Procedure: CATARACT EXTRACTION PHACO AND INTRAOCULAR LENS PLACEMENT (IOC) RIGHT;  Surgeon: Galen Manila, MD;  Location: [facility];  Service: Ophthalmology;  Laterality: Right;  6.65 0:41.0   CYSTOSCOPY WITH LITHOLAPAXY N/A 05/02/2022   Procedure: CYSTOSCOPY WITH LITHOLAPAXY;  Surgeon: Sondra Come, MD;  Location: [facility];  Service: Urology;  Laterality: N/A;   CYSTOSCOPY WITH URETHRAL DILATATION  12/04/2017   Procedure: CYSTOSCOPY WITH URETHRAL DILATATION;  Surgeon: Sondra Come, MD;  Location: [facility];  Service: Urology;;   CYSTOSCOPY WITH URETHRAL DILATATION N/A 05/02/2022   Procedure: CYSTOSCOPY  URETHRAL DILATATION;  Surgeon: Sondra Come, MD;  Location: [facility];  Service: Urology;  Laterality: N/A;   CYSTOSCOPY/URETEROSCOPY/HOLMIUM LASER/STENT PLACEMENT Left 12/04/2017   Procedure: CYSTOSCOPY/URETEROSCOPY/HOLMIUM LASER/STENT PLACEMENT;  Surgeon: Sondra Come, MD;  Location: [facility];  Service: Urology;  Laterality: Left;   CYSTOSCOPY/URETEROSCOPY/HOLMIUM LASER/STENT PLACEMENT Right 05/02/2022   Procedure: CYSTOSCOPY/URETEROSCOPY/HOLMIUM LASER/STENT PLACEMENT/RETROGRADE WITH [id number];  Surgeon: Sondra Come, MD;  Location: [facility];  Service: Urology;  Laterality: Right;   ESOPHAGOGASTRODUODENOSCOPY (EGD) WITH PROPOFOL N/A 12/21/2018   normal esophagus, normal stomach, peptic duodenitis - 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 8.5 g 6   amLODipine (NORVASC) 5 MG tablet TAKE 1 TABLET BY MOUTH DAILY 90 tablet 2   aspirin EC 81 MG tablet Take 81 mg by mouth daily. 1 tab by mouth in AM     BREZTRI AEROSPHERE 160-9-4.8 MCG/ACT AERO INHALE 2 PUFFS INTO THE LUNGS IN THE MORNING AND AT  BEDTIME 10.7 g 5   brinzolamide (AZOPT) 1 % ophthalmic suspension 1 drop 2 (two) times daily.     calcium carbonate (TUMS) 500 MG chewable tablet Chew 1 tablet (200 mg of elemental calcium total) by mouth daily as needed for indigestion or heartburn.     Cholecalciferol (VITAMIN D3) 10 MCG (400 UNIT) CAPS Take by mouth daily.     Cyanocobalamin 1000 MCG TBCR Take 1 tablet by mouth daily. One tab by mouth daily     Glucosamine-Chondroit-Vit C-Mn (GLUCOSAMINE 1500 COMPLEX) CAPS Take 1 capsule by mouth daily.     losartan (COZAAR) 100 MG tablet TAKE 1 TABLET BY MOUTH ONCE DAILY 90 tablet 3   Multiple Vitamin (MULTI-VITAMINS) TABS Take by mouth. 1 tab by mouth daily     Omega-3 Fatty Acids (FISH OIL) 1000 MG CAPS Take 1 capsule by mouth daily. 1 cap by mouth daily     omeprazole (PRILOSEC) 40 MG capsule Take 1 capsule (40 mg total) by mouth daily. For 3 weeks then as needed 30 capsule 3   rosuvastatin (CRESTOR) 40 MG tablet TAKE 1 TABLET BY MOUTH ONCE DAILY 90 tablet 0   sotalol (BETAPACE) 80 MG tablet TAKE ONE TABLET BY MOUTH TWICE DAILY 180 tablet 0   tadalafil (CIALIS) 20 MG tablet Take 1 tablet (20 mg total) by mouth daily as needed for erectile dysfunction (take 45 minutes prior to sexual activity). 30 tablet 6   tamsulosin (FLOMAX) 0.4 MG CAPS capsule Take 1 capsule (0.4 mg total) by mouth daily. 90 capsule 3   No current facility-administered medications for this visit.    Allergies:   Xarelto [rivaroxaban]    Social History:   reports that he quit smoking about 9 years ago. His smoking use included cigarettes. He started smoking about 62 years ago. He has a 106 pack-year smoking history. He has been exposed to tobacco smoke. He has never used smokeless tobacco. He reports that he does not drink alcohol and does not use drugs.   Family History:  family history includes COPD in his father and mother; Cancer in his father.    ROS:     Review of Systems  Constitutional: Negative.   HENT:  Negative.    Eyes: Negative.   Respiratory: Negative.    Cardiovascular:  Positive for chest pain.  Gastrointestinal: Negative.   Genitourinary: Negative.   Musculoskeletal: Negative.   Skin: Negative.   Neurological: Negative.   Endo/Heme/Allergies: Negative.   Psychiatric/Behavioral: Negative.    All other systems reviewed and are negative.     All other systems are reviewed and negative.    PHYSICAL EXAM: VS:  BP 118/72   Pulse 68   Ht 5\' 6"  (1.676 m)   Wt 162 lb (73.5 kg)   SpO2 98%   BMI 26.15 kg/m  , BMI Body mass index is 26.15 kg/m. Last weight:  Wt Readings from Last 3 Encounters:  11/07/22 162 lb (73.5 kg)  08/21/22 160 lb (72.6 kg)  07/08/22 155 lb (70.3 kg)  Physical Exam Vitals reviewed.  Constitutional:      Appearance: Normal appearance. He is normal weight.  HENT:     Head: Normocephalic.     Nose: Nose normal.     Mouth/Throat:     Mouth: Mucous membranes are moist.  Eyes:     Pupils: Pupils are equal, round, and reactive to light.  Cardiovascular:     Rate and Rhythm: Normal rate and regular rhythm.     Pulses: Normal pulses.     Heart sounds: Normal heart sounds.  Pulmonary:     Effort: Pulmonary effort is normal.  Abdominal:     General: Abdomen is flat. Bowel sounds are normal.  Musculoskeletal:        General: Normal range of motion.     Cervical back: Normal range of motion.  Skin:    General: Skin is warm.  Neurological:     General: No focal deficit present.     Mental Status: He is alert.  Psychiatric:        Mood and Affect: Mood normal.       EKG:   Recent Labs: 04/28/2022: BUN 16; Creatinine, Ser 1.42; Potassium 5.2; Sodium 143 04/30/2022: Hemoglobin 13.7; Platelets 301    Lipid Panel    Component Value Date/Time   CHOL 202 (H) 11/16/2017 1538   TRIG 221.0 (H) 11/16/2017 1538   TRIG 75 04/28/2013 0000   HDL 40.90 11/16/2017 1538   CHOLHDL 5 11/16/2017 1538   VLDL 44.2 (H) 11/16/2017 1538   LDLCALC 154  (H) 08/31/2015 0900   LDLDIRECT 152.0 11/16/2017 1538      Other studies Reviewed: Additional studies/ records that were reviewed today include:  Review of the above records demonstrates:       No data to display            ASSESSMENT AND PLAN:    ICD-10-CM   1. Coronary artery disease involving native coronary artery of native heart without angina pectoris  I25.10 MYOCARDIAL PERFUSION IMAGING    PCV ECHOCARDIOGRAM COMPLETE    2. Mixed hyperlipidemia  E78.2 MYOCARDIAL PERFUSION IMAGING    PCV ECHOCARDIOGRAM COMPLETE    3. Chronic atrial fibrillation (HCC)  I48.20 MYOCARDIAL PERFUSION IMAGING    PCV ECHOCARDIOGRAM COMPLETE    4. Other chest pain  R07.89 MYOCARDIAL PERFUSION IMAGING    PCV ECHOCARDIOGRAM COMPLETE   Will set up echo, stress test as has recurent chest pain daily       Problem List Items Addressed This Visit       Cardiovascular and Mediastinum   CAD (coronary artery disease) - Primary   Relevant Orders   MYOCARDIAL PERFUSION IMAGING   PCV ECHOCARDIOGRAM COMPLETE   Chronic atrial fibrillation (HCC)   Relevant Orders   MYOCARDIAL PERFUSION IMAGING   PCV ECHOCARDIOGRAM COMPLETE     Other   Hyperlipidemia   Relevant Orders   MYOCARDIAL PERFUSION IMAGING   PCV ECHOCARDIOGRAM COMPLETE   Other Visit Diagnoses     Other chest pain       Will set up echo, stress test as has recurent chest pain daily   Relevant Orders   MYOCARDIAL PERFUSION IMAGING   PCV ECHOCARDIOGRAM COMPLETE          Disposition:   Return in about 4 weeks (around 12/05/2022) for echo, stress test and f/u.    Total time spent: 35 minutes  Signed,  Adrian Blackwater, MD  11/07/2022 2:59 PM    [facility]

## 2022-11-18 ENCOUNTER — Other Ambulatory Visit: Payer: Self-pay | Admitting: Cardiovascular Disease

## 2022-12-03 ENCOUNTER — Other Ambulatory Visit: Payer: Self-pay | Admitting: Cardiovascular Disease

## 2022-12-11 ENCOUNTER — Ambulatory Visit: Payer: Medicare Other

## 2022-12-11 DIAGNOSIS — I2510 Atherosclerotic heart disease of native coronary artery without angina pectoris: Secondary | ICD-10-CM

## 2022-12-11 DIAGNOSIS — I351 Nonrheumatic aortic (valve) insufficiency: Secondary | ICD-10-CM

## 2022-12-11 DIAGNOSIS — I482 Chronic atrial fibrillation, unspecified: Secondary | ICD-10-CM

## 2022-12-11 DIAGNOSIS — R0789 Other chest pain: Secondary | ICD-10-CM

## 2022-12-11 DIAGNOSIS — E782 Mixed hyperlipidemia: Secondary | ICD-10-CM

## 2022-12-11 DIAGNOSIS — I361 Nonrheumatic tricuspid (valve) insufficiency: Secondary | ICD-10-CM

## 2022-12-15 ENCOUNTER — Ambulatory Visit (INDEPENDENT_AMBULATORY_CARE_PROVIDER_SITE_OTHER): Payer: Medicare Other

## 2022-12-15 DIAGNOSIS — I2510 Atherosclerotic heart disease of native coronary artery without angina pectoris: Secondary | ICD-10-CM | POA: Diagnosis not present

## 2022-12-15 DIAGNOSIS — I482 Chronic atrial fibrillation, unspecified: Secondary | ICD-10-CM | POA: Diagnosis not present

## 2022-12-15 DIAGNOSIS — R0789 Other chest pain: Secondary | ICD-10-CM

## 2022-12-15 DIAGNOSIS — E782 Mixed hyperlipidemia: Secondary | ICD-10-CM

## 2022-12-15 MED ORDER — TECHNETIUM TC 99M SESTAMIBI GENERIC - CARDIOLITE
9.6000 | Freq: Once | INTRAVENOUS | Status: AC | PRN
  Administered 2022-12-15: 9.6 via INTRAVENOUS

## 2022-12-15 MED ORDER — TECHNETIUM TC 99M SESTAMIBI GENERIC - CARDIOLITE
33.2000 | Freq: Once | INTRAVENOUS | Status: AC | PRN
  Administered 2022-12-15: 33.2 via INTRAVENOUS

## 2022-12-19 ENCOUNTER — Encounter: Payer: Self-pay | Admitting: Cardiovascular Disease

## 2022-12-19 ENCOUNTER — Ambulatory Visit (INDEPENDENT_AMBULATORY_CARE_PROVIDER_SITE_OTHER): Payer: Medicare Other | Admitting: Cardiovascular Disease

## 2022-12-19 VITALS — BP 109/74 | HR 70 | Ht 66.0 in | Wt 162.4 lb

## 2022-12-19 DIAGNOSIS — I2510 Atherosclerotic heart disease of native coronary artery without angina pectoris: Secondary | ICD-10-CM | POA: Diagnosis not present

## 2022-12-19 DIAGNOSIS — I10 Essential (primary) hypertension: Secondary | ICD-10-CM

## 2022-12-19 DIAGNOSIS — I4891 Unspecified atrial fibrillation: Secondary | ICD-10-CM

## 2022-12-19 DIAGNOSIS — E782 Mixed hyperlipidemia: Secondary | ICD-10-CM | POA: Diagnosis not present

## 2022-12-19 DIAGNOSIS — R0789 Other chest pain: Secondary | ICD-10-CM

## 2022-12-19 DIAGNOSIS — I482 Chronic atrial fibrillation, unspecified: Secondary | ICD-10-CM

## 2022-12-19 NOTE — Progress Notes (Signed)
Cardiology Office Note   Date:  12/19/2022   ID:  Victor Cortez, DOB [birth year], MRN [id number]  PCP:  Eustaquio Boyden, MD  Cardiologist:  Adrian Blackwater, MD      History of Present Illness: Victor Cortez is a 72 y.o. male who presents for  Chief Complaint  Patient presents with   Results    Doing well      Past Medical History:  Diagnosis Date   Aortic atherosclerosis (HCC)    Arthritis    Atrial fibrillation with RVR (HCC) 06/2013   a.) CHA2DS2VASc = 3 (age, HTN, vascular disease history); b.) rate/rhythm maintained on oral sotalol; no chronic anticoagulation   Basal cell carcinoma of nose 1998   Cholelithiasis 10/2017   COPD (chronic obstructive pulmonary disease) (HCC)    Coronary artery calcification seen on CT scan    Diastolic dysfunction    a.) TTE 07/29/2021: EF >55%, mild LVH, mild-mod MR, G1DD   ED (erectile dysfunction)    a.) on PDE5i (sildenafil) PRN   Ex-smoker    GERD (gastroesophageal reflux disease)    Glaucoma    Hepatic steatosis    History of bilateral cataract extraction 07/2020   History of chicken pox    History of pneumonia 06/2013   with sepsis and [facility] hospitalization   Hyperlipidemia    Hypertension    Nephrolithiasis    Right hydrocele    Rosacea    Seborrheic dermatitis    Urethral stricture    Wears dentures    full upper, partial lower     Past Surgical History:  Procedure Laterality Date   CATARACT EXTRACTION W/PHACO Left 07/10/2020   Procedure: CATARACT EXTRACTION PHACO AND INTRAOCULAR LENS PLACEMENT (IOC) LEFT VIVITY TORIC 9.00 00:51.4;  Surgeon: Galen Manila, MD;  Location: [facility];  Service: Ophthalmology;  Laterality: Left;   CATARACT EXTRACTION W/PHACO Right 07/24/2020   Procedure: CATARACT EXTRACTION PHACO AND INTRAOCULAR LENS PLACEMENT (IOC) RIGHT;  Surgeon: Galen Manila, MD;  Location: [facility];  Service: Ophthalmology;  Laterality: Right;  6.65 0:41.0   CYSTOSCOPY WITH  LITHOLAPAXY N/A 05/02/2022   Procedure: CYSTOSCOPY WITH LITHOLAPAXY;  Surgeon: Sondra Come, MD;  Location: [facility];  Service: Urology;  Laterality: N/A;   CYSTOSCOPY WITH URETHRAL DILATATION  12/04/2017   Procedure: CYSTOSCOPY WITH URETHRAL DILATATION;  Surgeon: Sondra Come, MD;  Location: [facility];  Service: Urology;;   CYSTOSCOPY WITH URETHRAL DILATATION N/A 05/02/2022   Procedure: CYSTOSCOPY  URETHRAL DILATATION;  Surgeon: Sondra Come, MD;  Location: [facility];  Service: Urology;  Laterality: N/A;   CYSTOSCOPY/URETEROSCOPY/HOLMIUM LASER/STENT PLACEMENT Left 12/04/2017   Procedure: CYSTOSCOPY/URETEROSCOPY/HOLMIUM LASER/STENT PLACEMENT;  Surgeon: Sondra Come, MD;  Location: [facility];  Service: Urology;  Laterality: Left;   CYSTOSCOPY/URETEROSCOPY/HOLMIUM LASER/STENT PLACEMENT Right 05/02/2022   Procedure: CYSTOSCOPY/URETEROSCOPY/HOLMIUM LASER/STENT PLACEMENT/RETROGRADE WITH [id number];  Surgeon: Sondra Come, MD;  Location: [facility];  Service: Urology;  Laterality: Right;   ESOPHAGOGASTRODUODENOSCOPY (EGD) WITH PROPOFOL N/A 12/21/2018   normal esophagus, normal stomach, peptic duodenitis - 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 8.5 g 6   amLODipine (NORVASC) 5 MG tablet TAKE 1 TABLET BY MOUTH DAILY 90 tablet 2   aspirin  EC 81 MG tablet Take 81 mg by mouth daily. 1 tab by mouth in AM     BREZTRI AEROSPHERE 160-9-4.8 MCG/ACT AERO INHALE 2 PUFFS INTO THE LUNGS IN THE MORNING AND AT BEDTIME 10.7 g 5   brinzolamide (AZOPT) 1 % ophthalmic suspension 1 drop 2 (two) times daily.     calcium carbonate (TUMS) 500 MG chewable tablet Chew 1 tablet (200 mg of elemental calcium total) by mouth daily as needed for  indigestion or heartburn.     Cholecalciferol (VITAMIN D3) 10 MCG (400 UNIT) CAPS Take by mouth daily.     Cyanocobalamin 1000 MCG TBCR Take 1 tablet by mouth daily. One tab by mouth daily     Glucosamine-Chondroit-Vit C-Mn (GLUCOSAMINE 1500 COMPLEX) CAPS Take 1 capsule by mouth daily.     losartan (COZAAR) 100 MG tablet TAKE 1 TABLET BY MOUTH ONCE DAILY 90 tablet 3   Multiple Vitamin (MULTI-VITAMINS) TABS Take by mouth. 1 tab by mouth daily     Omega-3 Fatty Acids (FISH OIL) 1000 MG CAPS Take 1 capsule by mouth daily. 1 cap by mouth daily     omeprazole (PRILOSEC) 40 MG capsule Take 1 capsule (40 mg total) by mouth daily. For 3 weeks then as needed 30 capsule 3   rosuvastatin (CRESTOR) 40 MG tablet TAKE 1 TABLET BY MOUTH ONCE DAILY 90 tablet 0   sotalol (BETAPACE) 80 MG tablet TAKE ONE TABLET BY MOUTH TWICE DAILY 180 tablet 0   tadalafil (CIALIS) 20 MG tablet Take 1 tablet (20 mg total) by mouth daily as needed for erectile dysfunction (take 45 minutes prior to sexual activity). 30 tablet 6   tamsulosin (FLOMAX) 0.4 MG CAPS capsule Take 1 capsule (0.4 mg total) by mouth daily. 90 capsule 3   No current facility-administered medications for this visit.    Allergies:   Xarelto [rivaroxaban]    Social History:   reports that he quit smoking about 9 years ago. His smoking use included cigarettes. He started smoking about 62 years ago. He has a 106 pack-year smoking history. He has been exposed to tobacco smoke. He has never used smokeless tobacco. He reports that he does not drink alcohol and does not use drugs.   Family History:  family history includes COPD in his father and mother; Cancer in his father.    ROS:     Review of Systems  Constitutional: Negative.   HENT: Negative.    Eyes: Negative.   Respiratory: Negative.    Gastrointestinal: Negative.   Genitourinary: Negative.   Musculoskeletal: Negative.   Skin: Negative.   Neurological: Negative.   Endo/Heme/Allergies: Negative.    Psychiatric/Behavioral: Negative.    All other systems reviewed and are negative.     All other systems are reviewed and negative.    PHYSICAL EXAM: VS:  BP 109/74   Pulse 70   Ht 5\' 6"  (1.676 m)   Wt 162 lb 6.4 oz (73.7 kg)   SpO2 97%   BMI 26.21 kg/m  , BMI Body mass index is 26.21 kg/m. Last weight:  Wt Readings from Last 3 Encounters:  12/19/22 162 lb 6.4 oz (73.7 kg)  11/07/22 162 lb (73.5 kg)  08/21/22 160 lb (72.6 kg)     Physical Exam Vitals reviewed.  Constitutional:      Appearance: Normal appearance. He is normal weight.  HENT:     Head: Normocephalic.     Nose: Nose normal.     Mouth/Throat:     Mouth:  Mucous membranes are moist.  Eyes:     Pupils: Pupils are equal, round, and reactive to light.  Cardiovascular:     Rate and Rhythm: Normal rate and regular rhythm.     Pulses: Normal pulses.     Heart sounds: Normal heart sounds.  Pulmonary:     Effort: Pulmonary effort is normal.  Abdominal:     General: Abdomen is flat. Bowel sounds are normal.  Musculoskeletal:        General: Normal range of motion.     Cervical back: Normal range of motion.  Skin:    General: Skin is warm.  Neurological:     General: No focal deficit present.     Mental Status: He is alert.  Psychiatric:        Mood and Affect: Mood normal.       EKG:   Recent Labs: 04/28/2022: BUN 16; Creatinine, Ser 1.42; Potassium 5.2; Sodium 143 04/30/2022: Hemoglobin 13.7; Platelets 301    Lipid Panel    Component Value Date/Time   CHOL 202 (H) 11/16/2017 1538   TRIG 221.0 (H) 11/16/2017 1538   TRIG 75 04/28/2013 0000   HDL 40.90 11/16/2017 1538   CHOLHDL 5 11/16/2017 1538   VLDL 44.2 (H) 11/16/2017 1538   LDLCALC 154 (H) 08/31/2015 0900   LDLDIRECT 152.0 11/16/2017 1538      Other studies Reviewed: Additional studies/ records that were reviewed today include:  Review of the above records demonstrates:       No data to display            ASSESSMENT AND  PLAN:    ICD-10-CM   1. Coronary artery disease involving native coronary artery of native heart without angina pectoris  I25.10     2. Mixed hyperlipidemia  E78.2     3. Chronic atrial fibrillation (HCC)  I48.20     4. Other chest pain  R07.89    stress test normal, LVEF normal, mild MR/TR    5. Lone atrial fibrillation with RVR  I48.91     6. Primary hypertension  I10    on amlodapine BP normal       Problem List Items Addressed This Visit       Cardiovascular and Mediastinum   Lone atrial fibrillation with RVR   CAD (coronary artery disease) - Primary   Chronic atrial fibrillation (HCC)     Other   Hyperlipidemia   Other Visit Diagnoses     Other chest pain       stress test normal, LVEF normal, mild MR/TR   Primary hypertension       on amlodapine BP normal          Disposition:   Return in about 3 months (around 03/21/2023).    Total time spent: 30 minutes  Signed,  Adrian Blackwater, MD  12/19/2022 2:33 PM    [facility]

## 2023-01-05 ENCOUNTER — Other Ambulatory Visit: Payer: Self-pay | Admitting: Cardiovascular Disease

## 2023-01-05 DIAGNOSIS — E782 Mixed hyperlipidemia: Secondary | ICD-10-CM

## 2023-02-16 ENCOUNTER — Other Ambulatory Visit: Payer: Self-pay | Admitting: Cardiovascular Disease

## 2023-02-24 ENCOUNTER — Other Ambulatory Visit: Payer: Self-pay | Admitting: Pulmonary Disease

## 2023-03-23 ENCOUNTER — Encounter: Payer: Self-pay | Admitting: Cardiology

## 2023-03-23 ENCOUNTER — Ambulatory Visit (INDEPENDENT_AMBULATORY_CARE_PROVIDER_SITE_OTHER): Payer: Medicare Other | Admitting: Cardiology

## 2023-03-23 VITALS — BP 112/58 | HR 66 | Ht 66.0 in | Wt 160.0 lb

## 2023-03-23 DIAGNOSIS — Z0130 Encounter for examination of blood pressure without abnormal findings: Secondary | ICD-10-CM

## 2023-03-23 DIAGNOSIS — I2510 Atherosclerotic heart disease of native coronary artery without angina pectoris: Secondary | ICD-10-CM

## 2023-03-23 DIAGNOSIS — I482 Chronic atrial fibrillation, unspecified: Secondary | ICD-10-CM

## 2023-03-23 DIAGNOSIS — E782 Mixed hyperlipidemia: Secondary | ICD-10-CM

## 2023-03-23 NOTE — Progress Notes (Signed)
Cardiology Office Note   Date:  03/23/2023   ID:  Victor Cortez, DOB 1950/05/08, MRN [id number]  PCP:  Eustaquio Boyden, MD  Cardiologist:  Marisue Ivan, NP      History of Present Illness: Victor Cortez is a 73 y.o. male who presents for  Chief Complaint  Patient presents with   Follow-up    3 Months Follow Up    Patient in office for routine cardiac exam. Denies chest pain, shortness of breath, dizziness, lower extremity edema.       Past Medical History:  Diagnosis Date   Aortic atherosclerosis (HCC)    Arthritis    Atrial fibrillation with RVR (HCC) 06/2013   a.) CHA2DS2VASc = 3 (age, HTN, vascular disease history); b.) rate/rhythm maintained on oral sotalol; no chronic anticoagulation   Basal cell carcinoma of nose 1998   Cholelithiasis 10/2017   COPD (chronic obstructive pulmonary disease) (HCC)    Coronary artery calcification seen on CT scan    Diastolic dysfunction    a.) TTE 07/29/2021: EF >55%, mild LVH, mild-mod MR, G1DD   ED (erectile dysfunction)    a.) on PDE5i (sildenafil) PRN   Ex-smoker    GERD (gastroesophageal reflux disease)    Glaucoma    Hepatic steatosis    History of bilateral cataract extraction 07/2020   History of chicken pox    History of pneumonia 06/2013   with sepsis and [facility] hospitalization   Hyperlipidemia    Hypertension    Nephrolithiasis    Right hydrocele    Rosacea    Seborrheic dermatitis    Urethral stricture    Wears dentures    full upper, partial lower     Past Surgical History:  Procedure Laterality Date   CATARACT EXTRACTION W/PHACO Left 07/10/2020   Procedure: CATARACT EXTRACTION PHACO AND INTRAOCULAR LENS PLACEMENT (IOC) LEFT VIVITY TORIC 9.00 00:51.4;  Surgeon: Galen Manila, MD;  Location: [facility];  Service: Ophthalmology;  Laterality: Left;   CATARACT EXTRACTION W/PHACO Right 07/24/2020   Procedure: CATARACT EXTRACTION PHACO AND INTRAOCULAR LENS PLACEMENT (IOC) RIGHT;  Surgeon:  Galen Manila, MD;  Location: [facility];  Service: Ophthalmology;  Laterality: Right;  6.65 0:41.0   CYSTOSCOPY WITH LITHOLAPAXY N/A 05/02/2022   Procedure: CYSTOSCOPY WITH LITHOLAPAXY;  Surgeon: Sondra Come, MD;  Location: [facility];  Service: Urology;  Laterality: N/A;   CYSTOSCOPY WITH URETHRAL DILATATION  12/04/2017   Procedure: CYSTOSCOPY WITH URETHRAL DILATATION;  Surgeon: Sondra Come, MD;  Location: [facility];  Service: Urology;;   CYSTOSCOPY WITH URETHRAL DILATATION N/A 05/02/2022   Procedure: CYSTOSCOPY  URETHRAL DILATATION;  Surgeon: Sondra Come, MD;  Location: [facility];  Service: Urology;  Laterality: N/A;   CYSTOSCOPY/URETEROSCOPY/HOLMIUM LASER/STENT PLACEMENT Left 12/04/2017   Procedure: CYSTOSCOPY/URETEROSCOPY/HOLMIUM LASER/STENT PLACEMENT;  Surgeon: Sondra Come, MD;  Location: [facility];  Service: Urology;  Laterality: Left;   CYSTOSCOPY/URETEROSCOPY/HOLMIUM LASER/STENT PLACEMENT Right 05/02/2022   Procedure: CYSTOSCOPY/URETEROSCOPY/HOLMIUM LASER/STENT PLACEMENT/RETROGRADE WITH [id number];  Surgeon: Sondra Come, MD;  Location: [facility];  Service: Urology;  Laterality: Right;   ESOPHAGOGASTRODUODENOSCOPY (EGD) WITH PROPOFOL N/A 12/21/2018   normal esophagus, normal stomach, peptic duodenitis - 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 8.5 g 6   amLODipine (NORVASC) 5 MG tablet TAKE 1 TABLET BY MOUTH DAILY 90 tablet 2   aspirin EC 81 MG tablet Take 81 mg by mouth daily. 1 tab by mouth in AM     BREZTRI AEROSPHERE 160-9-4.8 MCG/ACT AERO INHALE 2 PUFFS INTO THE LUNGS IN THE MORNING AND AT BEDTIME 10.7 g 5   brinzolamide (AZOPT) 1 % ophthalmic suspension 1 drop 2 (two) times daily.      calcium carbonate (TUMS) 500 MG chewable tablet Chew 1 tablet (200 mg of elemental calcium total) by mouth daily as needed for indigestion or heartburn.     Cholecalciferol (VITAMIN D3) 10 MCG (400 UNIT) CAPS Take by mouth daily.     Cyanocobalamin 1000 MCG TBCR Take 1 tablet by mouth daily. One tab by mouth daily     Glucosamine-Chondroit-Vit C-Mn (GLUCOSAMINE 1500 COMPLEX) CAPS Take 1 capsule by mouth daily.     losartan (COZAAR) 100 MG tablet TAKE 1 TABLET BY MOUTH ONCE DAILY 90 tablet 3   Multiple Vitamin (MULTI-VITAMINS) TABS Take by mouth. 1 tab by mouth daily     Omega-3 Fatty Acids (FISH OIL) 1000 MG CAPS Take 1 capsule by mouth daily. 1 cap by mouth daily     omeprazole (PRILOSEC) 40 MG capsule Take 1 capsule (40 mg total) by mouth daily. For 3 weeks then as needed 30 capsule 3   rosuvastatin (CRESTOR) 40 MG tablet TAKE 1 TABLET BY MOUTH ONCE DAILY 90 tablet 0   sotalol (BETAPACE) 80 MG tablet TAKE ONE TABLET BY MOUTH TWICE DAILY 180 tablet 0   tadalafil (CIALIS) 20 MG tablet Take 1 tablet (20 mg total) by mouth daily as needed for erectile dysfunction (take 45 minutes prior to sexual activity). 30 tablet 6   tamsulosin (FLOMAX) 0.4 MG CAPS capsule Take 1 capsule (0.4 mg total) by mouth daily. 90 capsule 3   No current facility-administered medications for this visit.    Allergies:   Xarelto [rivaroxaban]    Social History:   reports that he quit smoking about 9 years ago. His smoking use included cigarettes. He started smoking about 62 years ago. He has a 106 pack-year smoking history. He has been exposed to tobacco smoke. He has never used smokeless tobacco. He reports that he does not drink alcohol and does not use drugs.   Family History:  family history includes COPD in his father and mother; Cancer in his father.    ROS:     Review of Systems  Constitutional: Negative.   HENT: Negative.    Eyes: Negative.   Respiratory: Negative.    Cardiovascular: Negative.    Gastrointestinal: Negative.   Genitourinary: Negative.   Musculoskeletal: Negative.   Skin: Negative.   Neurological: Negative.   Endo/Heme/Allergies: Negative.   Psychiatric/Behavioral: Negative.    All other systems reviewed and are negative.     All other systems are reviewed and negative.    PHYSICAL EXAM: VS:  BP (!) 112/58   Pulse 66   Ht 5\' 6"  (1.676 m)   Wt 160 lb (72.6 kg)   SpO2 94%   BMI 25.82 kg/m  , BMI Body mass index is 25.82 kg/m. Last weight:  Wt Readings from Last 3 Encounters:  03/23/23 160 lb (72.6 kg)  12/19/22 162 lb 6.4 oz (73.7 kg)  11/07/22 162 lb (73.5 kg)     Physical Exam Vitals and nursing note reviewed.  Constitutional:      Appearance: Normal appearance.  He is normal weight.  HENT:     Head: Normocephalic and atraumatic.     Nose: Nose normal.     Mouth/Throat:     Mouth: Mucous membranes are moist.     Pharynx: Oropharynx is clear.  Eyes:     Extraocular Movements: Extraocular movements intact.     Conjunctiva/sclera: Conjunctivae normal.     Pupils: Pupils are equal, round, and reactive to light.  Cardiovascular:     Rate and Rhythm: Normal rate and regular rhythm.     Pulses: Normal pulses.     Heart sounds: Normal heart sounds.  Pulmonary:     Effort: Pulmonary effort is normal.     Breath sounds: Normal breath sounds.  Abdominal:     General: Abdomen is flat. Bowel sounds are normal.     Palpations: Abdomen is soft.  Musculoskeletal:        General: Normal range of motion.     Cervical back: Normal range of motion.  Skin:    General: Skin is warm and dry.  Neurological:     General: No focal deficit present.     Mental Status: He is alert and oriented to person, place, and time. Mental status is at baseline.  Psychiatric:        Mood and Affect: Mood normal.        Behavior: Behavior normal.        Thought Content: Thought content normal.        Judgment: Judgment normal.      EKG: none today  Recent  Labs: 04/28/2022: BUN 16; Creatinine, Ser 1.42; Potassium 5.2; Sodium 143 04/30/2022: Hemoglobin 13.7; Platelets 301    Lipid Panel    Component Value Date/Time   CHOL 202 (H) 11/16/2017 1538   TRIG 221.0 (H) 11/16/2017 1538   TRIG 75 04/28/2013 0000   HDL 40.90 11/16/2017 1538   CHOLHDL 5 11/16/2017 1538   VLDL 44.2 (H) 11/16/2017 1538   LDLCALC 154 (H) 08/31/2015 0900   LDLDIRECT 152.0 11/16/2017 1538      ASSESSMENT AND PLAN:    ICD-10-CM   1. Coronary artery disease involving native coronary artery of native heart without angina pectoris  I25.10     2. Chronic atrial fibrillation (HCC)  I48.20     3. Mixed hyperlipidemia  E78.2        Problem List Items Addressed This Visit       Cardiovascular and Mediastinum   CAD (coronary artery disease) - Primary   Denies chest pain. Gets short of breath on exertion patient relates to COPD. CCTA 03/2015 score 56, Mild mid RCA, mild distal LCX disease.  No significant disease in LAD. Normal stress test 12/2022.       Chronic atrial fibrillation (HCC)   In sinus rhythm on auscultation. Continue sotalol.         Other   Hyperlipidemia   On rosuvastatin, continue.           Disposition:   Return in about 3 months (around 06/20/2023) for cards.    Total time spent: 25 minutes  Signed,  Marisue Ivan, NP  03/23/2023 2:56 PM    [facility]

## 2023-03-23 NOTE — Assessment & Plan Note (Signed)
Denies chest pain. Gets short of breath on exertion patient relates to COPD. CCTA 03/2015 score 56, Mild mid RCA, mild distal LCX disease.  No significant disease in LAD. Normal stress test 12/2022.

## 2023-03-23 NOTE — Assessment & Plan Note (Signed)
In sinus rhythm on auscultation. Continue sotalol.

## 2023-03-23 NOTE — Assessment & Plan Note (Signed)
On rosuvastatin, continue 

## 2023-04-13 ENCOUNTER — Ambulatory Visit
Admission: RE | Admit: 2023-04-13 | Discharge: 2023-04-13 | Disposition: A | Discharge status: Home / Self Care | Payer: BLUE CROSS/BLUE SHIELD | Source: Ambulatory Visit | Attending: Acute Care | Admitting: Acute Care

## 2023-04-13 DIAGNOSIS — Z87891 Personal history of nicotine dependence: Secondary | ICD-10-CM

## 2023-05-01 ENCOUNTER — Other Ambulatory Visit: Payer: Self-pay

## 2023-05-01 DIAGNOSIS — Z87891 Personal history of nicotine dependence: Secondary | ICD-10-CM

## 2023-05-13 ENCOUNTER — Other Ambulatory Visit: Payer: Self-pay | Admitting: Nurse Practitioner

## 2023-05-13 NOTE — Telephone Encounter (Unsigned)
 Copied from CRM [id number]. Topic: Clinical - Medication Refill >> May 13, 2023  5:34 PM Para March B wrote: Most Recent Primary Care Visit:   Medication: BREZTRI AEROSPHERE 160-9-4.8 MCG/ACT AERO  Has the patient contacted their pharmacy? Yes (Agent: If no, request that the patient contact the pharmacy for the refill. If patient does not wish to contact the pharmacy document the reason why and proceed with request.) (Agent: If yes, when and what did the pharmacy advise?)  Is this the correct pharmacy for this prescription? Yes If no, delete pharmacy and type the correct one.  This is the patient's preferred pharmacy:  [facility] - [place], [place] - [address] [place] [address] Phone: [phone] Fax: [phone]   Has the prescription been filled recently? No  Is the patient out of the medication? Yes  Has the patient been seen for an appointment in the last year OR does the patient have an upcoming appointment? Yes  Can we respond through MyChart? No  Agent: Please be advised that Rx refills may take up to 3 business days. We ask that you follow-up with your pharmacy.

## 2023-05-14 MED ORDER — BREZTRI AEROSPHERE 160-9-4.8 MCG/ACT IN AERO: 2.0000 | INHALATION_SPRAY | Freq: Two times a day (BID) | RESPIRATORY_TRACT | 5 refills | Status: DC

## 2023-05-14 NOTE — Telephone Encounter (Signed)
 Last Fill: 05/19/22  Last OV: 04/11/21 Next OV: 05/27/23  Routing to provider for review/authorization.

## 2023-05-18 ENCOUNTER — Other Ambulatory Visit: Payer: Self-pay | Admitting: Urology

## 2023-05-27 ENCOUNTER — Ambulatory Visit: Admitting: Nurse Practitioner

## 2023-05-27 ENCOUNTER — Encounter: Payer: Self-pay | Admitting: Nurse Practitioner

## 2023-05-27 VITALS — BP 108/60 | HR 71 | Ht 66.0 in | Wt 163.0 lb

## 2023-05-27 DIAGNOSIS — B370 Candidal stomatitis: Secondary | ICD-10-CM | POA: Diagnosis not present

## 2023-05-27 DIAGNOSIS — I482 Chronic atrial fibrillation, unspecified: Secondary | ICD-10-CM | POA: Diagnosis not present

## 2023-05-27 DIAGNOSIS — J449 Chronic obstructive pulmonary disease, unspecified: Secondary | ICD-10-CM | POA: Diagnosis not present

## 2023-05-27 DIAGNOSIS — Z87891 Personal history of nicotine dependence: Secondary | ICD-10-CM | POA: Diagnosis not present

## 2023-05-27 DIAGNOSIS — J439 Emphysema, unspecified: Secondary | ICD-10-CM

## 2023-05-27 MED ORDER — NYSTATIN 100000 UNIT/ML MT SUSP: 5.0000 mL | Freq: Four times a day (QID) | OROMUCOSAL | 0 refills | Status: AC

## 2023-05-27 MED ORDER — OHTUVAYRE 3 MG/2.5ML IN SUSP: 2.5000 mL | Freq: Two times a day (BID) | RESPIRATORY_TRACT | Status: AC

## 2023-05-27 MED ORDER — PREDNISONE 20 MG PO TABS: 20.0000 mg | ORAL_TABLET | Freq: Every day | ORAL | 0 refills | Status: AC

## 2023-05-27 NOTE — Patient Instructions (Addendum)
 Continue Albuterol  inhaler 2 puffs or 3 mL neb every 6 hours as needed for shortness of breath or wheezing. Notify if symptoms persist despite rescue inhaler/neb use. Continue Breztri  2 puffs Twice daily. Brush tongue and rinse mouth afterwards  Start Ohtuvayre  2.5 mL neb Twice daily Prednisone  40 mg for 5 days. Take in AM with food  Guaifenesin 600 mg Twice daily as needed for cough/congestion Nystatin  5 mL 4 times a day for 10 days. Swish and swallow   You will need lung function testing when you come back  Oxygen levels looked good today I do want to check them overnight so we will have you do an overnight oxygen study on room air - someone will contact you to schedule this  Follow up after PFT in 8-10 weeks with any new pulmonologist or Victor Ayaz Sondgeroth,NP. If symptoms do not improve or worsen, please contact office for sooner follow up or seek emergency care.

## 2023-05-27 NOTE — Progress Notes (Unsigned)
 @Patient  ID: Victor Cortez, male    DOB: 1950-12-29, 73 y.o.   MRN: [id number]  Chief Complaint  Patient presents with   Follow-up    Former pt of  Dr Dariel Edelson . Discuss getting a different inhaler for sob , pt is currently using albuterol  and feel like it not helping, sob with moving      Referring provider: Claire Crick, MD  HPI: 73 year old male, former very heavy smoker followed for emphysema and lung nodules. He is a former patient of Dr. Glenis Langdon and last seen in office 04/11/2021. Past medical history significant for CAD, hx of a fib, GERD, hepatic steatosis, HLD. He has a history of ptx treated with tube thoracostomy by Dr. Nicanor Barge December 2021.   TEST/EVENTS:  12/11/2022 echo: nl EF, RV function. Mild TR. Mild PH. Mild mitral regurgitation.  04/13/2023 LDCT chest: atherosclerosis/CAD. Mild to moderate emphysema. Clustered nodularity in the LUL, likely due to prior infectious bronchiolitis. Scattered tiny calcified nodules, up to 2.8 mm. Lung RADS 1.  04/11/2021: OV with Dr. Thelda Finney. Prior 8 mm nodule in RUL decreased on repeat Super D CT. Emphysema on imaging. Started stiolto at last visit. Did feel like it helped. Does carry around albuterol . Still having SOB and wheezing. Quite smoking around 7 years ago; approx 106 pack year hx. Has not had PFTs yet. Start Breztri . Can re-enroll in lung cancer screening program starting March 2024.   05/27/2023: Today - follow up Discussed the use of AI scribe software for clinical note transcription with the patient, who gave verbal consent to proceed.  History of Present Illness   Victor Cortez is a 73 year old male with COPD who presents with increased shortness of breath over the last year, if not longer.   He experiences increased shortness of breath and relies heavily on his albuterol  inhaler, using it six to eight times daily, especially during physical activities like mowing. He finds the albuterol  ineffective he says for the most part  but continues to use it.   He is currently on Breztri , which he uses twice daily. These symptoms have persisted for at least a year, accompanied by a cough with occasional green sputum production. Cough does seem to get worse with allergy season. No hemoptysis, fevers, chills, leg swelling, orthopnea. He had a recent LDCT for lung cancer screening with no acute process and lung RADS 1.   He has a significant smoking history, with a pack-year history exceeding 100. Never had PFTs. Takes an over-the-counter allergy pill, though he is unsure of the brand. No sinus congestion or drainage is reported. He does experience wheezing. He has not used any antibiotics or steroids for his breathing  He thinks he has thrush. He does not clean his mouth after Brezti.       Allergies  Allergen Reactions   Xarelto  [Rivaroxaban ]     Coughed up blood    Immunization History  Administered Date(s) Administered   Fluad Quad(high Dose 65+) 10/31/2021   Hepatitis B 04/07/2005, 05/08/2005, 07/02/2005   Influenza, High Dose Seasonal PF 11/16/2018, 10/26/2019   Influenza,inj,Quad PF,6+ Mos 11/07/2014   Influenza-Unspecified 12/04/2017   Moderna Covid-19 Fall Seasonal Vaccine 61yrs & older 11/20/2021   Moderna SARS-COV2 Booster Vaccination 02/17/2020   Moderna Sars-Covid-2 Vaccination 04/27/2019, 05/25/2019   Pneumococcal Conjugate-13 07/11/2013   Pneumococcal Polysaccharide-23 02/03/2010, 08/31/2015   Td 08/04/2007   Tdap 11/25/2012   Zoster Recombinant(Shingrix) 02/19/2018, 06/08/2018   Zoster, Live 05/06/2011    Past Medical  History:  Diagnosis Date   Aortic atherosclerosis (HCC)    Arthritis    Atrial fibrillation with RVR (HCC) 06/2013   a.) CHA2DS2VASc = 3 (age, HTN, vascular disease history); b.) rate/rhythm maintained on oral sotalol ; no chronic anticoagulation   Basal cell carcinoma of nose 1998   Cholelithiasis 10/2017   COPD (chronic obstructive pulmonary disease) (HCC)    Coronary artery  calcification seen on CT scan    Diastolic dysfunction    a.) TTE 07/29/2021: EF >55%, mild LVH, mild-mod MR, G1DD   ED (erectile dysfunction)    a.) on PDE5i (sildenafil ) PRN   Ex-smoker    GERD (gastroesophageal reflux disease)    Glaucoma    Hepatic steatosis    History of bilateral cataract extraction 07/2020   History of chicken pox    History of pneumonia 06/2013   with sepsis and [facility] hospitalization   Hyperlipidemia    Hypertension    Nephrolithiasis    Right hydrocele    Rosacea    Seborrheic dermatitis    Urethral stricture    Wears dentures    full upper, partial lower    Tobacco History: Social History   Tobacco Use  Smoking Status Former   Current packs/day: 0.00   Average packs/day: 2.0 packs/day for 53.0 years (106.0 ttl pk-yrs)   Types: Cigarettes   Start date: 06/03/1960   Quit date: 06/03/2013   Years since quitting: 9.9   Passive exposure: Past  Smokeless Tobacco Never   Counseling given: Not Answered   Outpatient Medications Prior to Visit  Medication Sig Dispense Refill   amLODipine (NORVASC) 5 MG tablet TAKE 1 TABLET BY MOUTH DAILY 90 tablet 2   aspirin  EC 81 MG tablet Take 81 mg by mouth daily. 1 tab by mouth in AM     brinzolamide  (AZOPT ) 1 % ophthalmic suspension 1 drop 2 (two) times daily.     budeson-glycopyrrolate -formoterol (BREZTRI  AEROSPHERE) 160-9-4.8 MCG/ACT AERO inhaler Inhale 2 puffs into the lungs in the morning and at bedtime. 10.7 g 5   calcium  carbonate (TUMS) 500 MG chewable tablet Chew 1 tablet (200 mg of elemental calcium  total) by mouth daily as needed for indigestion or heartburn.     Cholecalciferol (VITAMIN D3) 10 MCG (400 UNIT) CAPS Take by mouth daily.     Cyanocobalamin  1000 MCG TBCR Take 1 tablet by mouth daily. One tab by mouth daily     Glucosamine-Chondroit-Vit C-Mn (GLUCOSAMINE 1500 COMPLEX) CAPS Take 1 capsule by mouth daily.     losartan  (COZAAR ) 100 MG tablet TAKE 1 TABLET BY MOUTH ONCE DAILY 90 tablet 3    Multiple Vitamin (MULTI-VITAMINS) TABS Take by mouth. 1 tab by mouth daily     rosuvastatin  (CRESTOR ) 40 MG tablet TAKE 1 TABLET BY MOUTH ONCE DAILY 90 tablet 0   sotalol  (BETAPACE ) 80 MG tablet TAKE ONE TABLET BY MOUTH TWICE DAILY 180 tablet 0   tadalafil  (CIALIS ) 20 MG tablet Take 1 tablet (20 mg total) by mouth daily as needed for erectile dysfunction (take 45 minutes prior to sexual activity). 30 tablet 6   tamsulosin  (FLOMAX ) 0.4 MG CAPS capsule TAKE 1 CAPSULE BY MOUTH EVERY DAY 90 capsule 3   albuterol  (VENTOLIN  HFA) 108 (90 Base) MCG/ACT inhaler INHALE 2 INHALATIONS INTO THE LUNGS EVERY 6 HOURS AS NEEDED FOR WHEEZING OR SHORTNESS OF BREATH. 8.5 g 6   Omega-3 Fatty Acids (FISH OIL) 1000 MG CAPS Take 1 capsule by mouth daily. 1 cap by mouth daily  omeprazole  (PRILOSEC) 40 MG capsule Take 1 capsule (40 mg total) by mouth daily. For 3 weeks then as needed 30 capsule 3   No facility-administered medications prior to visit.     Review of Systems:   Constitutional: No weight loss or gain, night sweats, fevers, chills, fatigue, or lassitude. HEENT: No headaches, difficulty swallowing, tooth/dental problems, or sore throat. No sneezing, itching, ear ache, nasal congestion, or post nasal drip CV:  No chest pain, orthopnea, PND, swelling in lower extremities, anasarca, dizziness, palpitations, syncope Resp: No shortness of breath with exertion or at rest. No excess mucus or change in color of mucus. No productive or non-productive. No hemoptysis. No wheezing.  No chest wall deformity GI:  No heartburn, indigestion, abdominal pain, nausea, vomiting, diarrhea, change in bowel habits, loss of appetite, bloody stools.  GU: No dysuria, change in color of urine, urgency or frequency.  No flank pain, no hematuria  Skin: No rash, lesions, ulcerations MSK:  No joint pain or swelling.  No decreased range of motion.  No back pain. Neuro: No dizziness or lightheadedness.  Psych: No depression or anxiety.  Mood stable.     Physical Exam:  BP 108/60   Pulse 71   Ht 5\' 6"  (1.676 m)   Wt 163 lb (73.9 kg)   SpO2 95%   BMI 26.31 kg/m   GEN: Pleasant, interactive, well-nourished/chronically-ill appearing/acutely-ill appearing/poorly-nourished/morbidly obese; in no acute distress.****** HEENT:  Normocephalic and atraumatic. EACs patent bilaterally. TM pearly gray with present light reflex bilaterally. PERRLA. Sclera white. Nasal turbinates pink, moist and patent bilaterally. No rhinorrhea present. Oropharynx pink and moist, without exudate or edema. No lesions, ulcerations, or postnasal drip.  NECK:  Supple w/ fair ROM. No JVD present. Normal carotid impulses w/o bruits. Thyroid  symmetrical with no goiter or nodules palpated. No lymphadenopathy.   CV: RRR, no m/r/g, no peripheral edema. Pulses intact, +2 bilaterally. No cyanosis, pallor or clubbing. PULMONARY:  Unlabored, regular breathing. Clear bilaterally A&P w/o wheezes/rales/rhonchi. No accessory muscle use.  GI: BS present and normoactive. Soft, non-tender to palpation. No organomegaly or masses detected. No CVA tenderness. MSK: No erythema, warmth or tenderness. Cap refil <2 sec all extrem. No deformities or joint swelling noted.  Neuro: A/Ox3. No focal deficits noted.   Skin: Warm, no lesions or rashe Psych: Normal affect and behavior. Judgement and thought content appropriate.     Lab Results:  CBC    Component Value Date/Time   WBC 14.9 (H) 04/30/2022 0807   RBC 4.64 04/30/2022 0807   HGB 13.7 04/30/2022 0807   HGB 12.6 (L) 06/07/2013 0756   HGB 13.1 06/07/2013 0000   HCT 42.1 04/30/2022 0807   HCT 38.8 (L) 06/07/2013 0756   PLT 301 04/30/2022 0807   PLT 249 06/07/2013 0756   MCV 90.7 04/30/2022 0807   MCV 87 06/07/2013 0756   MCH 29.5 04/30/2022 0807   MCHC 32.5 04/30/2022 0807   RDW 13.3 04/30/2022 0807   RDW 14.5 06/07/2013 0756   LYMPHSABS 2.4 11/16/2017 1538   LYMPHSABS 3.8 (H) 06/07/2013 0756   MONOABS 0.8  11/16/2017 1538   MONOABS 0.7 06/07/2013 0756   EOSABS 0.3 11/16/2017 1538   EOSABS 0.0 06/07/2013 0756   BASOSABS 0.1 11/16/2017 1538   BASOSABS 0.1 06/07/2013 0756    BMET    Component Value Date/Time   NA 143 04/28/2022 0903   NA 136 06/04/2013 0853   K 5.2 04/28/2022 0903   K 3.7 06/07/2013 0000   CL 102  04/28/2022 0903   CL 100 06/04/2013 0853   CO2 24 04/28/2022 0903   CO2 25 06/04/2013 0853   GLUCOSE 103 (H) 04/28/2022 0903   GLUCOSE 106 (H) 03/10/2019 0628   GLUCOSE 135 (H) 06/04/2013 0853   BUN 16 04/28/2022 0903   BUN 21 (H) 06/04/2013 0853   CREATININE 1.42 (H) 04/28/2022 0903   CREATININE 1.72 06/07/2013 0000   CALCIUM  10.2 04/28/2022 0903   CALCIUM  8.9 06/04/2013 0853   GFRNONAA >60 03/10/2019 0628   GFRNONAA >60 06/06/2013 0506   GFRAA >60 03/10/2019 0628   GFRAA >60 06/06/2013 0506    BNP No results found for: "BNP"   Imaging:  No results found.  Administration History     None           No data to display          No results found for: "NITRICOXIDE"      Assessment & Plan:   No problem-specific Assessment & Plan notes found for this encounter.   Advised if symptoms do not improve or worsen, to please contact office for sooner follow up or seek emergency care.   I spent *** minutes of dedicated to the care of this patient on the date of this encounter to include pre-visit review of records, face-to-face time with the patient discussing conditions above, post visit ordering of testing, clinical documentation with the electronic health record, making appropriate referrals as documented, and communicating necessary findings to members of the patients care team.  Roetta Clarke, NP 05/27/2023  Pt aware and understands NP's role.

## 2023-05-28 ENCOUNTER — Other Ambulatory Visit: Payer: Self-pay | Admitting: Cardiovascular Disease

## 2023-05-29 ENCOUNTER — Encounter: Payer: Self-pay | Admitting: Nurse Practitioner

## 2023-06-02 ENCOUNTER — Telehealth: Payer: Self-pay

## 2023-06-02 NOTE — Telephone Encounter (Signed)
 Received Ohtuvayre new start paperwork. Completed form and faxed with clinicals and insurance card copy to [facility]   Phone#: [phone] Fax#: [phone]

## 2023-06-04 NOTE — Telephone Encounter (Signed)
 Received fax from [employer] with summary of benefits. Referral form for Ohtuvayre  received. Rx will be triaged to [facility].. Once benefits investigation completed, pharmacy will reach out the patient to schedule shipment. If medication is unaffordable, patient will need to express financial hardship to be referred back to [place] [facility] for patient assistance program pre-screening.   Patient ID: [id number] Pharmacy phone: [phone] [place] [facility] Phone#: [phone]

## 2023-06-22 ENCOUNTER — Ambulatory Visit: Payer: Medicare Other | Admitting: Cardiovascular Disease

## 2023-06-23 ENCOUNTER — Telehealth: Payer: Self-pay

## 2023-06-23 ENCOUNTER — Ambulatory Visit
Admission: RE | Admit: 2023-06-23 | Discharge: 2023-06-23 | Disposition: A | Discharge status: Home / Self Care | Source: Ambulatory Visit | Attending: Physician Assistant | Admitting: Physician Assistant

## 2023-06-23 ENCOUNTER — Ambulatory Visit (INDEPENDENT_AMBULATORY_CARE_PROVIDER_SITE_OTHER): Admitting: Physician Assistant

## 2023-06-23 ENCOUNTER — Other Ambulatory Visit: Payer: Self-pay

## 2023-06-23 ENCOUNTER — Telehealth: Payer: Self-pay | Admitting: Nurse Practitioner

## 2023-06-23 VITALS — BP 109/70 | HR 76 | Ht 66.0 in | Wt 160.0 lb

## 2023-06-23 DIAGNOSIS — N201 Calculus of ureter: Secondary | ICD-10-CM | POA: Diagnosis not present

## 2023-06-23 DIAGNOSIS — Z87442 Personal history of urinary calculi: Secondary | ICD-10-CM | POA: Insufficient documentation

## 2023-06-23 DIAGNOSIS — R109 Unspecified abdominal pain: Secondary | ICD-10-CM | POA: Diagnosis present

## 2023-06-23 DIAGNOSIS — N200 Calculus of kidney: Secondary | ICD-10-CM | POA: Diagnosis not present

## 2023-06-23 LAB — MICROSCOPIC EXAMINATION: RBC, Urine: >30 /HPF — AB (ref 0–2)

## 2023-06-23 LAB — URINALYSIS, COMPLETE
Bilirubin, UA: NEGATIVE
Glucose, UA: NEGATIVE
Ketones, UA: NEGATIVE
Nitrite, UA: NEGATIVE
Specific Gravity, UA: 1.02 (ref 1.005–1.030)
Urobilinogen, Ur: 1 mg/dL (ref 0.2–1.0)
pH, UA: 6 (ref 5.0–7.5)

## 2023-06-23 LAB — BLADDER SCAN AMB NON-IMAGING

## 2023-06-23 MED ORDER — OXYCODONE-ACETAMINOPHEN 5-325 MG PO TABS: 1.0000 | ORAL_TABLET | Freq: Four times a day (QID) | ORAL | 0 refills | Status: AC | PRN

## 2023-06-23 MED ORDER — ONDANSETRON 4 MG PO TBDP: 4.0000 mg | ORAL_TABLET | Freq: Three times a day (TID) | ORAL | 0 refills | Status: DC | PRN

## 2023-06-23 MED ORDER — SULFAMETHOXAZOLE-TRIMETHOPRIM 800-160 MG PO TABS: 1.0000 | ORAL_TABLET | Freq: Two times a day (BID) | ORAL | 0 refills | Status: AC

## 2023-06-23 NOTE — H&P (View-Only) (Signed)
 06/23/2023 2:13 PM   Victor Cortez 1950/09/24 [id number]  CC: Chief Complaint  Patient presents with   Nephrolithiasis   HPI: Victor Cortez is a 73 y.o. male with PMH nephrolithiasis, bladder stones, BPH, ED, right hydrocele, urethral stricture, and renal cyst who presents today for evaluation of testicular pain.   Today he reports 4 days of bilateral flank pain and bilateral testicular swelling and pain.  He has not had a bowel movement since his symptoms started.  He has been vomiting and has anorexia, but denies fevers.  STAT CT stone study shows a 9mm proximal left ureteral stone with upstream hydroureteronephrosis. Also with nonobstructing left renal stones.  In-office UA today positive for cloudy clarity, 1+ protein, 3+ blood, and 1+ leukocytes; urine microscopy with 11-30 WBCs/HPF, >30 RBCs/HPF, calcium  oxalate crystals, and moderate bacteria. PVR 30mL.  PMH: Past Medical History:  Diagnosis Date   Aortic atherosclerosis (HCC)    Arthritis    Atrial fibrillation with RVR (HCC) 06/2013   a.) CHA2DS2VASc = 3 (age, HTN, vascular disease history); b.) rate/rhythm maintained on oral sotalol ; no chronic anticoagulation   Basal cell carcinoma of nose 1998   Cholelithiasis 10/2017   COPD (chronic obstructive pulmonary disease) (HCC)    Coronary artery calcification seen on CT scan    Diastolic dysfunction    a.) TTE 07/29/2021: EF >55%, mild LVH, mild-mod MR, G1DD   ED (erectile dysfunction)    a.) on PDE5i (sildenafil ) PRN   Ex-smoker    GERD (gastroesophageal reflux disease)    Glaucoma    Hepatic steatosis    History of bilateral cataract extraction 07/2020   History of chicken pox    History of pneumonia 06/2013   with sepsis and [facility] hospitalization   Hyperlipidemia    Hypertension    Nephrolithiasis    Right hydrocele    Rosacea    Seborrheic dermatitis    Urethral stricture    Wears dentures    full upper, partial lower    Surgical History: Past  Surgical History:  Procedure Laterality Date   CATARACT EXTRACTION W/PHACO Left 07/10/2020   Procedure: CATARACT EXTRACTION PHACO AND INTRAOCULAR LENS PLACEMENT (IOC) LEFT VIVITY TORIC 9.00 00:51.4;  Surgeon: Clair Crews, MD;  Location: [facility];  Service: Ophthalmology;  Laterality: Left;   CATARACT EXTRACTION W/PHACO Right 07/24/2020   Procedure: CATARACT EXTRACTION PHACO AND INTRAOCULAR LENS PLACEMENT (IOC) RIGHT;  Surgeon: Clair Crews, MD;  Location: [facility];  Service: Ophthalmology;  Laterality: Right;  6.65 0:41.0   CYSTOSCOPY WITH LITHOLAPAXY N/A 05/02/2022   Procedure: CYSTOSCOPY WITH LITHOLAPAXY;  Surgeon: Lawerence Pressman, MD;  Location: [facility];  Service: Urology;  Laterality: N/A;   CYSTOSCOPY WITH URETHRAL DILATATION  12/04/2017   Procedure: CYSTOSCOPY WITH URETHRAL DILATATION;  Surgeon: Lawerence Pressman, MD;  Location: [facility];  Service: Urology;;   CYSTOSCOPY WITH URETHRAL DILATATION N/A 05/02/2022   Procedure: CYSTOSCOPY  URETHRAL DILATATION;  Surgeon: Lawerence Pressman, MD;  Location: [facility];  Service: Urology;  Laterality: N/A;   CYSTOSCOPY/URETEROSCOPY/HOLMIUM LASER/STENT PLACEMENT Left 12/04/2017   Procedure: CYSTOSCOPY/URETEROSCOPY/HOLMIUM LASER/STENT PLACEMENT;  Surgeon: Lawerence Pressman, MD;  Location: [facility];  Service: Urology;  Laterality: Left;   CYSTOSCOPY/URETEROSCOPY/HOLMIUM LASER/STENT PLACEMENT Right 05/02/2022   Procedure: CYSTOSCOPY/URETEROSCOPY/HOLMIUM LASER/STENT PLACEMENT/RETROGRADE WITH VZDGLOVF;  Surgeon: Lawerence Pressman, MD;  Location: [facility];  Service: Urology;  Laterality: Right;   ESOPHAGOGASTRODUODENOSCOPY (EGD) WITH PROPOFOL  N/A 12/21/2018   normal esophagus, normal stomach, peptic duodenitis - done for  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 If you have any questions, ask your nurse or doctor.          albuterol  108 (90 Base) MCG/ACT inhaler Commonly known as: VENTOLIN  HFA INHALE 2 INHALATIONS INTO THE LUNGS EVERY 6 HOURS AS NEEDED FOR WHEEZING OR SHORTNESS OF BREATH.   amLODipine 5 MG tablet Commonly known as: NORVASC TAKE 1 TABLET BY MOUTH DAILY   aspirin  EC 81 MG tablet Take 81 mg by mouth daily. 1 tab by mouth in AM   Breztri  Aerosphere 160-9-4.8 MCG/ACT Aero inhaler Generic drug: budesonide-glycopyrrolate -formoterol Inhale 2 puffs into the lungs in the morning and at bedtime.   brinzolamide  1 % ophthalmic suspension Commonly known as: AZOPT  1 drop 2 (two) times daily.   calcium  carbonate 500 MG chewable tablet Commonly known as: Tums Chew 1 tablet (200 mg of elemental calcium  total) by mouth daily as needed for indigestion or heartburn.   Cyanocobalamin  1000 MCG Tbcr Take 1 tablet by mouth daily. One tab by mouth daily   Glucosamine 1500 Complex Caps Take 1 capsule by mouth daily.   losartan  100 MG tablet Commonly known as: COZAAR  TAKE 1 TABLET BY MOUTH ONCE DAILY   Multi-Vitamins Tabs Take by mouth. 1 tab by mouth daily   Ohtuvayre  3 MG/2.5ML Susp Generic drug: Ensifentrine  Inhale 2.5 mLs into the lungs 2 (two) times daily.   rosuvastatin  40 MG tablet Commonly known as: CRESTOR  TAKE 1 TABLET BY MOUTH ONCE DAILY   sotalol  80 MG tablet Commonly known as: BETAPACE  TAKE ONE TABLET BY MOUTH TWICE DAILY   tadalafil  20 MG tablet Commonly known as: CIALIS  Take 1 tablet (20 mg total) by mouth daily as needed for erectile dysfunction (take 45 minutes prior to sexual activity).   tamsulosin  0.4 MG Caps capsule Commonly known as: FLOMAX  TAKE 1 CAPSULE BY MOUTH EVERY DAY   Vitamin D3 10 MCG (400 UNIT) Caps Take by mouth daily.        Allergies:  Allergies  Allergen Reactions   Xarelto  [Rivaroxaban ]     Coughed up blood     Family History: Family History  Problem Relation Age of Onset   COPD Father    Cancer Father        lung (smoker)   COPD Mother    CAD Neg Hx    Stroke Neg Hx    Bladder Cancer Neg Hx    Prostate cancer Neg Hx     Social History:   reports that he quit smoking about 10 years ago. His smoking use included cigarettes. He started smoking about 63 years ago. He has a 106 pack-year smoking history. He has been exposed to tobacco smoke. He has never used smokeless tobacco. He reports that he does not drink alcohol and does not use drugs.  Physical Exam: BP 109/70   Pulse 76   Ht 5\' 6"  (1.676 m)   Wt 160 lb (72.6 kg)   BMI 25.82 kg/m   Constitutional:  Alert and oriented, no acute distress, nontoxic appearing HEENT: [place], AT Cardiovascular: No clubbing, cyanosis, or edema Respiratory: Normal respiratory effort, no increased work of breathing GU:  Large right hydrocele, unable to palpate right testicle. Left testicle is not enlarged and nontender.  No scrotal edema, purulence, or crepitus. Skin: No rashes, bruises or suspicious lesions Neurologic: Grossly intact, no focal deficits, moving all 4 extremities Psychiatric: Normal mood and affect  Laboratory Data: Results for orders placed or performed in visit on 06/23/23  Microscopic Examination   Collection Time: 06/23/23  1:58 PM   Urine  Result Value Ref Range   WBC, UA 11-30 (A) 0 - 5 /hpf   RBC, Urine >30 (A) 0 - 2 /hpf   Epithelial Cells (non renal) 0-10 0 - 10 /hpf   Casts Present (A) None seen /lpf   Cast Type Hyaline casts N/A   Crystals Present (A) N/A   Crystal Type Calcium  Oxalate N/A   Mucus, UA Present (A) Not Estab.   Bacteria, UA Moderate (A) None seen/Few  Urinalysis, Complete   Collection Time: 06/23/23  1:58 PM  Result Value Ref Range   Specific Gravity, UA 1.020 1.005 - 1.030   pH, UA 6.0 5.0 - 7.5   Color, UA Yellow Yellow   Appearance Ur Cloudy (A) Clear   Leukocytes,UA 1+ (A) Negative   Protein,UA  1+ (A) Negative/Trace   Glucose, UA Negative Negative   Ketones, UA Negative Negative   RBC, UA 3+ (A) Negative   Bilirubin, UA Negative Negative   Urobilinogen, Ur 1.0 0.2 - 1.0 mg/dL   Nitrite, UA Negative Negative   Microscopic Examination See below:   BLADDER SCAN AMB NON-IMAGING   Collection Time: 06/23/23  2:08 PM  Result Value Ref Range   Scan Result 30ml     Pertinent Imaging: CT stone study, 06/23/2023: CLINICAL DATA:  Abdominal/flank pain, stone suspected.   EXAM: CT ABDOMEN AND PELVIS WITHOUT CONTRAST   TECHNIQUE: Multidetector CT imaging of the abdomen and pelvis was performed following the standard protocol without IV contrast.   RADIATION DOSE REDUCTION: This exam was performed according to the departmental dose-optimization program which includes automated exposure control, adjustment of the mA and/or kV according to patient size and/or use of iterative reconstruction technique.   COMPARISON:  CT scan abdomen and pelvis from 04/16/2022.   FINDINGS: Lower chest: There are emphysematous changes in the visualized lung bases. Lung bases are otherwise clear. No consolidation or pleural effusion. Normal heart size. No pericardial effusion.   Hepatobiliary: The liver is normal in size. Non-cirrhotic configuration. No suspicious mass. No intrahepatic or extrahepatic bile duct dilation. Redemonstration of multiple calcified gallstones in the contracted gallbladder. No abnormal wall thickening or pericholecystic fat stranding. There is intrahepatic gallbladder. No imaging evidence of acute cholecystitis.   Pancreas: Unremarkable. No pancreatic ductal dilatation or surrounding inflammatory changes.   Spleen: Within normal limits. No focal lesion.   Adrenals/Urinary Tract: Adrenal glands are unremarkable. No suspicious renal mass within the limitations of this unenhanced exam. There is a 8 x 9 mm left mid ureteric calculus causing mild-to-moderate proximal  hydronephrosis and hydroureter. Left ureter distal to the calculus is unremarkable. There are at least 3, calculi in the left kidney with largest measuring up to 4 x 6 mm in the interpolar region. No right nephroureterolithiasis or obstructive uropathy. There is faint calcification along the posterior wall of urinary bladder, grossly similar to the prior study. Urinary bladder is otherwise unremarkable. No calculi, focal mass or perivesical fat stranding.   Stomach/Bowel: No disproportionate dilation of the small or large bowel loops. No evidence of abnormal bowel wall thickening or inflammatory changes. The  appendix is unremarkable. There are multiple diverticula mainly in the left hemi colon, without imaging signs of diverticulitis.   Vascular/Lymphatic: No ascites or pneumoperitoneum. No abdominal or pelvic lymphadenopathy, by size criteria. No aneurysmal dilation of the major abdominal arteries. There are moderate peripheral atherosclerotic vascular calcifications of the aorta and its major branches.   Reproductive: Normal size prostate. Symmetric seminal vesicles.   Other: There are fat containing umbilical and bilateral inguinal hernias. The soft tissues and abdominal wall are otherwise unremarkable.   Musculoskeletal: No suspicious osseous lesions. There are mild - moderate multilevel degenerative changes in the visualized spine.   IMPRESSION: 1. There is a 8 x 9 mm left mid ureteric calculus causing mild-to-moderate proximal hydronephrosis and hydroureter. There are at least 3, additional nonobstructing calculi in the left kidney. No right nephroureterolithiasis or obstructive uropathy. 2. Multiple other nonacute observations, as described above.   Aortic Atherosclerosis (ICD10-I70.0) and Emphysema (ICD10-J43.9).     Electronically Signed   By: Beula Brunswick M.D.   On: 06/23/2023 15:27  I personally reviewed the images referenced above and note a 9 mm obstructing  proximal left ureteral stone.  Assessment & Plan:   1. Left ureteral stone (Primary) 9 mm obstructing proximal left ureteral stone on stat CT stone study today.  UA is suspicious, though no signs of systemic infection.  Will start empiric Bactrim  and send for culture for further evaluation.  I offered him left ureteroscopy with laser lithotripsy and stent placement with Dr. Estanislao Heimlich this Friday and he agreed.  I anticipate he will keep the stent 7 to 10 days and require cystoscopy stent removal given stone size.  He understands this.  I am sending in Percocet and Zofran  for symptom control.  We discussed return precautions including fever, uncontrolled pain, and uncontrolled nausea/vomiting. - Urinalysis, Complete - BLADDER SCAN AMB NON-IMAGING - CULTURE, URINE COMPREHENSIVE - CT RENAL STONE STUDY; Future - sulfamethoxazole -trimethoprim  (BACTRIM  DS) 800-160 MG tablet; Take 1 tablet by mouth 2 (two) times daily for 7 days.  Dispense: 14 tablet; Refill: 0 - Ambulatory Referral For Surgery Scheduling - oxyCODONE -acetaminophen  (PERCOCET/ROXICET) 5-325 MG tablet; Take 1-2 tablets by mouth every 6 (six) hours as needed for up to 5 days for severe pain (pain score 7-10).  Dispense: 14 tablet; Refill: 0 - ondansetron  (ZOFRAN -ODT) 4 MG disintegrating tablet; Take 1 tablet (4 mg total) by mouth every 8 (eight) hours as needed for nausea or vomiting.  Dispense: 20 tablet; Refill: 0   Return for Will call to schedule surgery.  Kathreen Pare, PA-C  [facility] [place] [address] [place], [place] [address] [phone]

## 2023-06-23 NOTE — Progress Notes (Signed)
 06/23/2023 2:13 PM   Victor Cortez 1950/09/24 [id number]  CC: Chief Complaint  Patient presents with   Nephrolithiasis   HPI: Victor Cortez is a 73 y.o. male with PMH nephrolithiasis, bladder stones, BPH, ED, right hydrocele, urethral stricture, and renal cyst who presents today for evaluation of testicular pain.   Today he reports 4 days of bilateral flank pain and bilateral testicular swelling and pain.  He has not had a bowel movement since his symptoms started.  He has been vomiting and has anorexia, but denies fevers.  STAT CT stone study shows a 9mm proximal left ureteral stone with upstream hydroureteronephrosis. Also with nonobstructing left renal stones.  In-office UA today positive for cloudy clarity, 1+ protein, 3+ blood, and 1+ leukocytes; urine microscopy with 11-30 WBCs/HPF, >30 RBCs/HPF, calcium  oxalate crystals, and moderate bacteria. PVR 30mL.  PMH: Past Medical History:  Diagnosis Date   Aortic atherosclerosis (HCC)    Arthritis    Atrial fibrillation with RVR (HCC) 06/2013   a.) CHA2DS2VASc = 3 (age, HTN, vascular disease history); b.) rate/rhythm maintained on oral sotalol ; no chronic anticoagulation   Basal cell carcinoma of nose 1998   Cholelithiasis 10/2017   COPD (chronic obstructive pulmonary disease) (HCC)    Coronary artery calcification seen on CT scan    Diastolic dysfunction    a.) TTE 07/29/2021: EF >55%, mild LVH, mild-mod MR, G1DD   ED (erectile dysfunction)    a.) on PDE5i (sildenafil ) PRN   Ex-smoker    GERD (gastroesophageal reflux disease)    Glaucoma    Hepatic steatosis    History of bilateral cataract extraction 07/2020   History of chicken pox    History of pneumonia 06/2013   with sepsis and [facility] hospitalization   Hyperlipidemia    Hypertension    Nephrolithiasis    Right hydrocele    Rosacea    Seborrheic dermatitis    Urethral stricture    Wears dentures    full upper, partial lower    Surgical History: Past  Surgical History:  Procedure Laterality Date   CATARACT EXTRACTION W/PHACO Left 07/10/2020   Procedure: CATARACT EXTRACTION PHACO AND INTRAOCULAR LENS PLACEMENT (IOC) LEFT VIVITY TORIC 9.00 00:51.4;  Surgeon: Clair Crews, MD;  Location: [facility];  Service: Ophthalmology;  Laterality: Left;   CATARACT EXTRACTION W/PHACO Right 07/24/2020   Procedure: CATARACT EXTRACTION PHACO AND INTRAOCULAR LENS PLACEMENT (IOC) RIGHT;  Surgeon: Clair Crews, MD;  Location: [facility];  Service: Ophthalmology;  Laterality: Right;  6.65 0:41.0   CYSTOSCOPY WITH LITHOLAPAXY N/A 05/02/2022   Procedure: CYSTOSCOPY WITH LITHOLAPAXY;  Surgeon: Lawerence Pressman, MD;  Location: [facility];  Service: Urology;  Laterality: N/A;   CYSTOSCOPY WITH URETHRAL DILATATION  12/04/2017   Procedure: CYSTOSCOPY WITH URETHRAL DILATATION;  Surgeon: Lawerence Pressman, MD;  Location: [facility];  Service: Urology;;   CYSTOSCOPY WITH URETHRAL DILATATION N/A 05/02/2022   Procedure: CYSTOSCOPY  URETHRAL DILATATION;  Surgeon: Lawerence Pressman, MD;  Location: [facility];  Service: Urology;  Laterality: N/A;   CYSTOSCOPY/URETEROSCOPY/HOLMIUM LASER/STENT PLACEMENT Left 12/04/2017   Procedure: CYSTOSCOPY/URETEROSCOPY/HOLMIUM LASER/STENT PLACEMENT;  Surgeon: Lawerence Pressman, MD;  Location: [facility];  Service: Urology;  Laterality: Left;   CYSTOSCOPY/URETEROSCOPY/HOLMIUM LASER/STENT PLACEMENT Right 05/02/2022   Procedure: CYSTOSCOPY/URETEROSCOPY/HOLMIUM LASER/STENT PLACEMENT/RETROGRADE WITH VZDGLOVF;  Surgeon: Lawerence Pressman, MD;  Location: [facility];  Service: Urology;  Laterality: Right;   ESOPHAGOGASTRODUODENOSCOPY (EGD) WITH PROPOFOL  N/A 12/21/2018   normal esophagus, normal stomach, peptic duodenitis - done for  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 If you have any questions, ask your nurse or doctor.          albuterol  108 (90 Base) MCG/ACT inhaler Commonly known as: VENTOLIN  HFA INHALE 2 INHALATIONS INTO THE LUNGS EVERY 6 HOURS AS NEEDED FOR WHEEZING OR SHORTNESS OF BREATH.   amLODipine 5 MG tablet Commonly known as: NORVASC TAKE 1 TABLET BY MOUTH DAILY   aspirin  EC 81 MG tablet Take 81 mg by mouth daily. 1 tab by mouth in AM   Breztri  Aerosphere 160-9-4.8 MCG/ACT Aero inhaler Generic drug: budesonide-glycopyrrolate -formoterol Inhale 2 puffs into the lungs in the morning and at bedtime.   brinzolamide  1 % ophthalmic suspension Commonly known as: AZOPT  1 drop 2 (two) times daily.   calcium  carbonate 500 MG chewable tablet Commonly known as: Tums Chew 1 tablet (200 mg of elemental calcium  total) by mouth daily as needed for indigestion or heartburn.   Cyanocobalamin  1000 MCG Tbcr Take 1 tablet by mouth daily. One tab by mouth daily   Glucosamine 1500 Complex Caps Take 1 capsule by mouth daily.   losartan  100 MG tablet Commonly known as: COZAAR  TAKE 1 TABLET BY MOUTH ONCE DAILY   Multi-Vitamins Tabs Take by mouth. 1 tab by mouth daily   Ohtuvayre  3 MG/2.5ML Susp Generic drug: Ensifentrine  Inhale 2.5 mLs into the lungs 2 (two) times daily.   rosuvastatin  40 MG tablet Commonly known as: CRESTOR  TAKE 1 TABLET BY MOUTH ONCE DAILY   sotalol  80 MG tablet Commonly known as: BETAPACE  TAKE ONE TABLET BY MOUTH TWICE DAILY   tadalafil  20 MG tablet Commonly known as: CIALIS  Take 1 tablet (20 mg total) by mouth daily as needed for erectile dysfunction (take 45 minutes prior to sexual activity).   tamsulosin  0.4 MG Caps capsule Commonly known as: FLOMAX  TAKE 1 CAPSULE BY MOUTH EVERY DAY   Vitamin D3 10 MCG (400 UNIT) Caps Take by mouth daily.        Allergies:  Allergies  Allergen Reactions   Xarelto  [Rivaroxaban ]     Coughed up blood     Family History: Family History  Problem Relation Age of Onset   COPD Father    Cancer Father        lung (smoker)   COPD Mother    CAD Neg Hx    Stroke Neg Hx    Bladder Cancer Neg Hx    Prostate cancer Neg Hx     Social History:   reports that he quit smoking about 10 years ago. His smoking use included cigarettes. He started smoking about 63 years ago. He has a 106 pack-year smoking history. He has been exposed to tobacco smoke. He has never used smokeless tobacco. He reports that he does not drink alcohol and does not use drugs.  Physical Exam: BP 109/70   Pulse 76   Ht 5\' 6"  (1.676 m)   Wt 160 lb (72.6 kg)   BMI 25.82 kg/m   Constitutional:  Alert and oriented, no acute distress, nontoxic appearing HEENT: [place], AT Cardiovascular: No clubbing, cyanosis, or edema Respiratory: Normal respiratory effort, no increased work of breathing GU:  Large right hydrocele, unable to palpate right testicle. Left testicle is not enlarged and nontender.  No scrotal edema, purulence, or crepitus. Skin: No rashes, bruises or suspicious lesions Neurologic: Grossly intact, no focal deficits, moving all 4 extremities Psychiatric: Normal mood and affect  Laboratory Data: Results for orders placed or performed in visit on 06/23/23  Microscopic Examination   Collection Time: 06/23/23  1:58 PM   Urine  Result Value Ref Range   WBC, UA 11-30 (A) 0 - 5 /hpf   RBC, Urine >30 (A) 0 - 2 /hpf   Epithelial Cells (non renal) 0-10 0 - 10 /hpf   Casts Present (A) None seen /lpf   Cast Type Hyaline casts N/A   Crystals Present (A) N/A   Crystal Type Calcium  Oxalate N/A   Mucus, UA Present (A) Not Estab.   Bacteria, UA Moderate (A) None seen/Few  Urinalysis, Complete   Collection Time: 06/23/23  1:58 PM  Result Value Ref Range   Specific Gravity, UA 1.020 1.005 - 1.030   pH, UA 6.0 5.0 - 7.5   Color, UA Yellow Yellow   Appearance Ur Cloudy (A) Clear   Leukocytes,UA 1+ (A) Negative   Protein,UA  1+ (A) Negative/Trace   Glucose, UA Negative Negative   Ketones, UA Negative Negative   RBC, UA 3+ (A) Negative   Bilirubin, UA Negative Negative   Urobilinogen, Ur 1.0 0.2 - 1.0 mg/dL   Nitrite, UA Negative Negative   Microscopic Examination See below:   BLADDER SCAN AMB NON-IMAGING   Collection Time: 06/23/23  2:08 PM  Result Value Ref Range   Scan Result 30ml     Pertinent Imaging: CT stone study, 06/23/2023: CLINICAL DATA:  Abdominal/flank pain, stone suspected.   EXAM: CT ABDOMEN AND PELVIS WITHOUT CONTRAST   TECHNIQUE: Multidetector CT imaging of the abdomen and pelvis was performed following the standard protocol without IV contrast.   RADIATION DOSE REDUCTION: This exam was performed according to the departmental dose-optimization program which includes automated exposure control, adjustment of the mA and/or kV according to patient size and/or use of iterative reconstruction technique.   COMPARISON:  CT scan abdomen and pelvis from 04/16/2022.   FINDINGS: Lower chest: There are emphysematous changes in the visualized lung bases. Lung bases are otherwise clear. No consolidation or pleural effusion. Normal heart size. No pericardial effusion.   Hepatobiliary: The liver is normal in size. Non-cirrhotic configuration. No suspicious mass. No intrahepatic or extrahepatic bile duct dilation. Redemonstration of multiple calcified gallstones in the contracted gallbladder. No abnormal wall thickening or pericholecystic fat stranding. There is intrahepatic gallbladder. No imaging evidence of acute cholecystitis.   Pancreas: Unremarkable. No pancreatic ductal dilatation or surrounding inflammatory changes.   Spleen: Within normal limits. No focal lesion.   Adrenals/Urinary Tract: Adrenal glands are unremarkable. No suspicious renal mass within the limitations of this unenhanced exam. There is a 8 x 9 mm left mid ureteric calculus causing mild-to-moderate proximal  hydronephrosis and hydroureter. Left ureter distal to the calculus is unremarkable. There are at least 3, calculi in the left kidney with largest measuring up to 4 x 6 mm in the interpolar region. No right nephroureterolithiasis or obstructive uropathy. There is faint calcification along the posterior wall of urinary bladder, grossly similar to the prior study. Urinary bladder is otherwise unremarkable. No calculi, focal mass or perivesical fat stranding.   Stomach/Bowel: No disproportionate dilation of the small or large bowel loops. No evidence of abnormal bowel wall thickening or inflammatory changes. The  appendix is unremarkable. There are multiple diverticula mainly in the left hemi colon, without imaging signs of diverticulitis.   Vascular/Lymphatic: No ascites or pneumoperitoneum. No abdominal or pelvic lymphadenopathy, by size criteria. No aneurysmal dilation of the major abdominal arteries. There are moderate peripheral atherosclerotic vascular calcifications of the aorta and its major branches.   Reproductive: Normal size prostate. Symmetric seminal vesicles.   Other: There are fat containing umbilical and bilateral inguinal hernias. The soft tissues and abdominal wall are otherwise unremarkable.   Musculoskeletal: No suspicious osseous lesions. There are mild - moderate multilevel degenerative changes in the visualized spine.   IMPRESSION: 1. There is a 8 x 9 mm left mid ureteric calculus causing mild-to-moderate proximal hydronephrosis and hydroureter. There are at least 3, additional nonobstructing calculi in the left kidney. No right nephroureterolithiasis or obstructive uropathy. 2. Multiple other nonacute observations, as described above.   Aortic Atherosclerosis (ICD10-I70.0) and Emphysema (ICD10-J43.9).     Electronically Signed   By: Beula Brunswick M.D.   On: 06/23/2023 15:27  I personally reviewed the images referenced above and note a 9 mm obstructing  proximal left ureteral stone.  Assessment & Plan:   1. Left ureteral stone (Primary) 9 mm obstructing proximal left ureteral stone on stat CT stone study today.  UA is suspicious, though no signs of systemic infection.  Will start empiric Bactrim  and send for culture for further evaluation.  I offered him left ureteroscopy with laser lithotripsy and stent placement with Dr. Estanislao Heimlich this Friday and he agreed.  I anticipate he will keep the stent 7 to 10 days and require cystoscopy stent removal given stone size.  He understands this.  I am sending in Percocet and Zofran  for symptom control.  We discussed return precautions including fever, uncontrolled pain, and uncontrolled nausea/vomiting. - Urinalysis, Complete - BLADDER SCAN AMB NON-IMAGING - CULTURE, URINE COMPREHENSIVE - CT RENAL STONE STUDY; Future - sulfamethoxazole -trimethoprim  (BACTRIM  DS) 800-160 MG tablet; Take 1 tablet by mouth 2 (two) times daily for 7 days.  Dispense: 14 tablet; Refill: 0 - Ambulatory Referral For Surgery Scheduling - oxyCODONE -acetaminophen  (PERCOCET/ROXICET) 5-325 MG tablet; Take 1-2 tablets by mouth every 6 (six) hours as needed for up to 5 days for severe pain (pain score 7-10).  Dispense: 14 tablet; Refill: 0 - ondansetron  (ZOFRAN -ODT) 4 MG disintegrating tablet; Take 1 tablet (4 mg total) by mouth every 8 (eight) hours as needed for nausea or vomiting.  Dispense: 20 tablet; Refill: 0   Return for Will call to schedule surgery.  Kathreen Pare, PA-C  [facility] [place] [address] [place], [place] [address] [phone]

## 2023-06-23 NOTE — Patient Instructions (Addendum)
 We're getting you scheduled for a ureteroscopy surgery with Dr. Estanislao Heimlich this Friday. Our scheduler will call you to get it arranged. In the meantime:  -Treat any pain with Advil (ibuprofen), Tylenol  (acetaminophen ), or Percocet -Treat any nausea with Zofran  -Take Bactrim  (sulfamethoxazole -trimethoprim ) twice daily to clear your urine of any bacteria  Please go to the Emergency Department if you develop any of the following: -Fever/chills -Nausea and/or vomiting uncontrollable with Zofran  -Pain uncontrollable with Percocet

## 2023-06-23 NOTE — Telephone Encounter (Signed)
 Pt called the triage line stating he is having testicle pain and swelling. Pt states his kidneys ache and is vomiting, symptoms started on Friday. Per Dr.Sninsky pt would need to see a PA or go to the ER if symptoms worsen. Pt was scheduled to see a PA this afternoon, pt aware.

## 2023-06-25 ENCOUNTER — Other Ambulatory Visit: Payer: Self-pay

## 2023-06-25 ENCOUNTER — Encounter
Admission: RE | Admit: 2023-06-25 | Discharge: 2023-06-25 | Disposition: A | Discharge status: Home / Self Care | Source: Ambulatory Visit | Attending: Urology | Admitting: Urology

## 2023-06-25 ENCOUNTER — Encounter: Payer: Self-pay | Admitting: Urology

## 2023-06-25 DIAGNOSIS — I4891 Unspecified atrial fibrillation: Secondary | ICD-10-CM | POA: Insufficient documentation

## 2023-06-25 DIAGNOSIS — I2510 Atherosclerotic heart disease of native coronary artery without angina pectoris: Secondary | ICD-10-CM | POA: Diagnosis not present

## 2023-06-25 DIAGNOSIS — Z01812 Encounter for preprocedural laboratory examination: Secondary | ICD-10-CM | POA: Diagnosis present

## 2023-06-25 DIAGNOSIS — Z01810 Encounter for preprocedural cardiovascular examination: Secondary | ICD-10-CM | POA: Diagnosis present

## 2023-06-25 DIAGNOSIS — Z01818 Encounter for other preprocedural examination: Secondary | ICD-10-CM | POA: Insufficient documentation

## 2023-06-25 HISTORY — DX: Pneumonia, unspecified organism: J18.9

## 2023-06-25 HISTORY — DX: Dyspnea, unspecified: R06.00

## 2023-06-25 HISTORY — DX: Personal history of urinary calculi: Z87.442

## 2023-06-25 LAB — BASIC METABOLIC PANEL WITH GFR
Anion gap: 11 (ref 5–15)
BUN: 36 mg/dL — ABNORMAL HIGH (ref 8–23)
CO2: 26 mmol/L (ref 22–32)
Calcium: 9.6 mg/dL (ref 8.9–10.3)
Chloride: 102 mmol/L (ref 98–111)
Creatinine, Ser: 3.57 mg/dL — ABNORMAL HIGH (ref 0.61–1.24)
GFR, Estimated: 17 mL/min — ABNORMAL LOW (ref >60)
Glucose, Bld: 127 mg/dL — ABNORMAL HIGH (ref 70–99)
Potassium: 3.9 mmol/L (ref 3.5–5.1)
Sodium: 139 mmol/L (ref 135–145)

## 2023-06-25 LAB — CBC
HCT: 39.2 % (ref 39.0–52.0)
Hemoglobin: 13.3 g/dL (ref 13.0–17.0)
MCH: 29.9 pg (ref 26.0–34.0)
MCHC: 33.9 g/dL (ref 30.0–36.0)
MCV: 88.1 fL (ref 80.0–100.0)
Platelets: 244 10*3/uL (ref 150–400)
RBC: 4.45 MIL/uL (ref 4.22–5.81)
RDW: 13.1 % (ref 11.5–15.5)
WBC: 8.2 10*3/uL (ref 4.0–10.5)
nRBC: 0 % (ref 0.0–0.2)

## 2023-06-25 MED ORDER — CEFAZOLIN SODIUM-DEXTROSE 2-4 GM/100ML-% IV SOLN: 2.0000 g | INTRAVENOUS | Status: DC

## 2023-06-25 MED ORDER — CHLORHEXIDINE GLUCONATE 0.12 % MT SOLN
15.0000 mL | Freq: Once | OROMUCOSAL | Status: AC
  Administered 2023-06-26: 15 mL via OROMUCOSAL

## 2023-06-25 MED ORDER — ORAL CARE MOUTH RINSE: 15.0000 mL | Freq: Once | OROMUCOSAL | Status: AC

## 2023-06-25 MED ORDER — LACTATED RINGERS IV SOLN: INTRAVENOUS | Status: DC

## 2023-06-25 NOTE — Progress Notes (Signed)
  [facility] Perioperative Services: Pre-Admission/Anesthesia Testing  Abnormal Lab Notification   Date: 06/25/23  Name: Victor Cortez MRN:   [id number]  Re: Abnormal labs noted during PAT appointment   Notified:    Provider Name Provider Role Notification Mode  Lawerence Pressman, MD Urology (Surgeon) Routed and/or faxed via [facility]   ABNORMAL LAB VALUE(S):   Lab Results  Component Value Date   BUN 36 (H) 06/25/2023   CREATININE 3.57 (H) 06/25/2023   Clinical Information and Notes:  Victor Cortez is scheduled for a CYSTOSCOPY/URETEROSCOPY/HOLMIUM LASER/STENT PLACEMENT (Left) on 06/26/2023.   Preoperative labs revealed decreased renal function presumably secondary to obstructive uropathy. Recent CT imaging of the abdomen pelvis on 06/23/2023 demonstrated an 8 x 9 mm LEFT mid ureteral calculus causing mild to moderate proximal hydronephrosis and hydroureter.  Previous renal function only mildly abnormal over the course of the last several years; creatinine range 1.07-1.50 mg/dL.    As previously mentioned, patient scheduled for surgery tomorrow.  Result communicated to primary attending surgeon.  Renate Caroline, MSN, APRN, FNP-C, CEN [facility]  Perioperative Services Nurse Practitioner Phone: [phone] Fax: [phone] 06/25/23 3:59 PM

## 2023-06-25 NOTE — Progress Notes (Signed)
 Perioperative / Anesthesia Services  Pre-Admission Testing Clinical Review / Pre-Operative Anesthesia Consult  Date: 06/25/23  PATIENT DEMOGRAPHICS: Name: Victor Cortez DOB: [birth year] MRN:   [id number]  Note: Available PAT nursing documentation and vital signs have been reviewed. Clinical nursing staff has updated patient's PMH/PSHx, current medication list, and drug allergies/intolerances to ensure complete and comprehensive history available to assist care teams in MDM as it pertains to the aforementioned surgical procedure and anticipated anesthetic course. Extensive review of available clinical information personally performed.  PMH and PSHx updated with any diagnoses/procedures that  may have been inadvertently omitted during his intake with the pre-admission testing department's nursing staff.  PLANNED SURGICAL PROCEDURE(S):    Case: [id number] Date/Time: 06/26/23 1235   Procedure: CYSTOSCOPY/URETEROSCOPY/HOLMIUM LASER/STENT PLACEMENT (Left)   Anesthesia type: General   Diagnosis: Left ureteral stone [N20.1]   Pre-op diagnosis: Left ureteral stone   Location: [facility] OR ROOM 10 / [facility] ORS FOR ANESTHESIA GROUP   Surgeons: Lawerence Pressman, MD     CLINICAL DISCUSSION: Victor Cortez is a 73 y.o. male who is submitted for pre-surgical anesthesia review and clearance prior to him undergoing the above procedure.Patient is a Former Smoker (106 pack years; quit 06/2013). Pertinent PMH includes: CAD, atrial fibrillation, diastolic dysfunction, aortic atherosclerosis, HTN, HLD, COPD, GERD (on daily PPI), ED (on PDE5i), nephrolithiasis, urethral stricture, glaucoma.   Patient is followed by cardiology Meredeth Stallion, MD). He was last seen in the cardiology clinic on 03/23/2023; notes reviewed. At the time of his clinic visit, patient doing well overall from a cardiovascular perspective. Patient denied any chest pain, shortness of breath, PND, orthopnea, palpitations, significant peripheral  edema, weakness, fatigue, vertiginous symptoms, or presyncope/syncope. Patient with a past medical history significant for cardiovascular diagnoses. Documented physical exam was grossly benign, providing no evidence of acute exacerbation and/or decompensation of the patient's known cardiovascular conditions.  Myocardial perfusion imaging study performed on 05/01/2020 revealed a normal left ventricular systolic function with a hyperdynamic LVEF of 74%.  There were no regional wall motion abnormalities.  Patient exercised a total of 6 minutes and 2 seconds achieving 7 METS of physical activity.  There was no evidence of stress-induced myocardial ischemia or arrhythmia; no scintigraphic evidence of scar.  Study determined to be normal and low risk.   Last TTE was performed on 07/09/2021 revealing a normal left ventricular systolic function with an EF of >55%.  There were no regional wall motion abnormalities.  Mild concentric LVH observed. Left ventricular diastolic Doppler parameters consistent with abnormal relaxation (G1DD). There was mild to moderate mitral valve regurgitation. All transvalvular gradients were noted to be normal providing no evidence suggestive of valvular stenosis.  Patient with an atrial fibrillation diagnosis; CHA2DS2-VASc Score = 3 (age, HTN, vascular disease history). His rate and rhythm are currently being maintained on oral sotalol .  Patient is not currently on chronic oral anticoagulation therapy.  Blood pressure well controlled at 112/58 mmHg on currently prescribed CCB (amlodipine), ARB (losartan ), and beta-blocker (sotalol ) therapies. He is on a rosuvastatin  + omega-3 fatty acid for his HLD diagnosis and further ASCVD prevention. Of note, in the setting of known cardiovascular diagnoses, it is important to note that patient is on a PDE5i (sildenafil ) for erectile dysfunction diagnosis. Patient is not diabetic. He does not have an OSAH diagnosis. Patient is able to complete all of  his  ADL/IADLs without cardiovascular limitation.  Per the DASI, patient is able to achieve at least 4 METS of physical activity without experiencing any  significant degree of angina/anginal equivalent symptoms. No changes were made to his medication regimen.  Patient follow-up with outpatient cardiology in 3 months or sooner if needed.   Victor Cortez is scheduled for an elective CYSTOSCOPY/URETEROSCOPY/HOLMIUM LASER/STENT PLACEMENT (Left) on 06/26/2023 with Dr. Jay Meth, MD.  Given patient's past medical history significant for cardiovascular diagnoses, presurgical cardiac clearance was sought by the PAT team. Per cardiology, "this patient is optimized for surgery and may proceed with the planned procedural course with a LOW risk of significant perioperative cardiovascular complications".  In review of the patient's chart, it is noted that he is on daily oral antithrombotic therapy. Given that patient's past medical history is significant for cardiovascular diagnoses, including but not limited to CAD, urology has cleared patient to continue his daily low dose ASA throughout his perioperative course. He will be asked to hold his normal dose on the day of his procedure only. Patient has been updated on these directives from his specialty care providers by the PAT team.  Patient denies previous perioperative complications with anesthesia in the past. In review his EMR, it is noted that patient underwent a general anesthetic course here at [facility] ([facility]) in 04/2022 without documented complications.   MOST RECENT VITAL SIGNS:    06/23/2023    2:06 PM 05/27/2023    2:41 PM 03/23/2023    2:37 PM  Vitals with BMI  Height 5\' 6"  5\' 6"  5\' 6"   Weight 160 lbs 163 lbs 160 lbs  BMI 25.84 26.32 25.84  Systolic 109 108 161  Diastolic 70 60 58  Pulse 76 71 66   PROVIDERS/SPECIALISTS: NOTE: Primary physician provider listed below. Patient may have been seen by APP or  partner within same practice.   PROVIDER ROLE / SPECIALTY LAST Orion Birks, MD Urology (Surgeon) 06/23/2023  Claire Crick, MD Primary Care Provider 05/27/2023  Debborah Fairly, MD Cardiology 03/23/2023  Antonio Baumgarten, MD Pulmonary Medicine 05/27/2023   ALLERGIES: Allergies  Allergen Reactions   Xarelto  [Rivaroxaban ]     Coughed up blood   CURRENT HOME MEDICATIONS: No current facility-administered medications for this encounter.    albuterol  (VENTOLIN  HFA) 108 (90 Base) MCG/ACT inhaler   amLODipine (NORVASC) 5 MG tablet   ascorbic acid (VITAMIN C) 500 MG tablet   aspirin  EC 81 MG tablet   brinzolamide  (AZOPT ) 1 % ophthalmic suspension   budeson-glycopyrrolate -formoterol (BREZTRI  AEROSPHERE) 160-9-4.8 MCG/ACT AERO inhaler   calcium  carbonate (TUMS) 500 MG chewable tablet   Cholecalciferol (VITAMIN D3 MAXIMUM STRENGTH) 125 MCG (5000 UT) capsule   Cyanocobalamin  1000 MCG TBCR   dorzolamide (TRUSOPT) 2 % ophthalmic solution   Ensifentrine  (OHTUVAYRE ) 3 MG/2.5ML SUSP   EPINEPHrine  (PRIMATENE  MIST) 0.125 MG/ACT AERO   losartan  (COZAAR ) 100 MG tablet   Multiple Vitamin (MULTI-VITAMINS) TABS   oxyCODONE -acetaminophen  (PERCOCET/ROXICET) 5-325 MG tablet   rosuvastatin  (CRESTOR ) 40 MG tablet   sotalol  (BETAPACE ) 80 MG tablet   sulfamethoxazole -trimethoprim  (BACTRIM  DS) 800-160 MG tablet   tamsulosin  (FLOMAX ) 0.4 MG CAPS capsule   TURMERIC-GINGER PO   vitamin E 180 MG (400 UNITS) capsule   ondansetron  (ZOFRAN -ODT) 4 MG disintegrating tablet   HISTORY: Past Medical History:  Diagnosis Date   Aortic atherosclerosis (HCC)    Arthritis    Atrial fibrillation with RVR (HCC) 06/2013   a.) CHA2DS2VASc = 3 (age, HTN, vascular disease history); b.) rate/rhythm maintained on oral sotalol ; no chronic anticoagulation   Basal cell carcinoma of nose 1998   Cholelithiasis  10/2017   COPD (chronic obstructive pulmonary disease) (HCC)    Coronary artery calcification seen on CT scan     Diastolic dysfunction    a.) TTE 07/29/2021: EF >55%, mild LVH, mild-mod MR, G1DD   Dyspnea    ED (erectile dysfunction)    a.) on PDE5i (sildenafil ) PRN   Ex-smoker    GERD (gastroesophageal reflux disease)    Glaucoma    Hepatic steatosis    History of bilateral cataract extraction 07/2020   History of chicken pox    History of kidney stones    History of pneumonia 06/2013   with sepsis and [facility] hospitalization   Hyperlipidemia    Hypertension    Left ureteral stone    Nephrolithiasis    Pneumonia    Right hydrocele    Rosacea    Seborrheic dermatitis    Urethral stricture    Wears dentures    full upper, partial lower   Past Surgical History:  Procedure Laterality Date   CATARACT EXTRACTION W/PHACO Left 07/10/2020   Procedure: CATARACT EXTRACTION PHACO AND INTRAOCULAR LENS PLACEMENT (IOC) LEFT VIVITY TORIC 9.00 00:51.4;  Surgeon: Clair Crews, MD;  Location: [facility];  Service: Ophthalmology;  Laterality: Left;   CATARACT EXTRACTION W/PHACO Right 07/24/2020   Procedure: CATARACT EXTRACTION PHACO AND INTRAOCULAR LENS PLACEMENT (IOC) RIGHT;  Surgeon: Clair Crews, MD;  Location: [facility];  Service: Ophthalmology;  Laterality: Right;  6.65 0:41.0   COLONOSCOPY     CYSTOSCOPY WITH LITHOLAPAXY N/A 05/02/2022   Procedure: CYSTOSCOPY WITH LITHOLAPAXY;  Surgeon: Lawerence Pressman, MD;  Location: [facility] ORS;  Service: Urology;  Laterality: N/A;   CYSTOSCOPY WITH URETHRAL DILATATION  12/04/2017   Procedure: CYSTOSCOPY WITH URETHRAL DILATATION;  Surgeon: Lawerence Pressman, MD;  Location: [facility] ORS;  Service: Urology;;   CYSTOSCOPY WITH URETHRAL DILATATION N/A 05/02/2022   Procedure: CYSTOSCOPY  URETHRAL DILATATION;  Surgeon: Lawerence Pressman, MD;  Location: [facility] ORS;  Service: Urology;  Laterality: N/A;   CYSTOSCOPY/URETEROSCOPY/HOLMIUM LASER/STENT PLACEMENT Left 12/04/2017   Procedure: CYSTOSCOPY/URETEROSCOPY/HOLMIUM LASER/STENT PLACEMENT;  Surgeon:  Lawerence Pressman, MD;  Location: [facility] ORS;  Service: Urology;  Laterality: Left;   CYSTOSCOPY/URETEROSCOPY/HOLMIUM LASER/STENT PLACEMENT Right 05/02/2022   Procedure: CYSTOSCOPY/URETEROSCOPY/HOLMIUM LASER/STENT PLACEMENT/RETROGRADE WITH [id number];  Surgeon: Lawerence Pressman, MD;  Location: [facility] ORS;  Service: Urology;  Laterality: Right;   ESOPHAGOGASTRODUODENOSCOPY (EGD) WITH PROPOFOL  N/A 12/21/2018   normal esophagus, normal stomach, peptic duodenitis - done for 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 Antony Baumgartner, Lenton Rail, MD)   ROTATOR CUFF REPAIR Right 2016   Family History  Problem Relation Age of Onset   COPD Father    Cancer Father        lung (smoker)   COPD Mother    CAD Neg Hx    Stroke Neg Hx    Bladder Cancer Neg Hx    Prostate cancer Neg Hx    Social History   Tobacco Use   Smoking status: Former    Current packs/day: 0.00    Average packs/day: 2.0 packs/day for 53.0 years (106.0 ttl pk-yrs)    Types: Cigarettes    Start date: 06/03/1960    Quit date: 06/03/2013    Years since quitting: 10.0    Passive exposure: Past   Smokeless tobacco: Never  Substance Use Topics   Alcohol use: No   LABS:  Hospital Outpatient Visit on 06/25/2023  Component Date Value Ref Range Status   WBC 06/25/2023 8.2  4.0 - 10.5 K/uL Final  RBC 06/25/2023 4.45  4.22 - 5.81 MIL/uL Final   Hemoglobin 06/25/2023 13.3  13.0 - 17.0 g/dL Final   HCT 14/78/2956 39.2  39.0 - 52.0 % Final   MCV 06/25/2023 88.1  80.0 - 100.0 fL Final   MCH 06/25/2023 29.9  26.0 - 34.0 pg Final   MCHC 06/25/2023 33.9  30.0 - 36.0 g/dL Final   RDW 21/30/8657 13.1  11.5 - 15.5 % Final   Platelets 06/25/2023 244  150 - 400 K/uL Final   nRBC 06/25/2023 0.0  0.0 - 0.2 % Final   Performed at [facility], [address] [place], [place] [address]   Sodium 06/25/2023 139  135 - 145 mmol/L Final   Potassium 06/25/2023 3.9  3.5 - 5.1 mmol/L Final   Chloride 06/25/2023 102  98 - 111 mmol/L Final   CO2 06/25/2023 26  22 - 32  mmol/L Final   Glucose, Bld 06/25/2023 127 (H)  70 - 99 mg/dL Final   Glucose reference range applies only to samples taken after fasting for at least 8 hours.   BUN 06/25/2023 36 (H)  8 - 23 mg/dL Final   Creatinine, Ser 06/25/2023 3.57 (H)  0.61 - 1.24 mg/dL Final   Calcium  06/25/2023 9.6  8.9 - 10.3 mg/dL Final   GFR, Estimated 06/25/2023 17 (L)  >60 mL/min Final   Comment: (NOTE) Calculated using the CKD-EPI Creatinine Equation (2021)    Anion gap 06/25/2023 11  5 - 15 Final   Performed at [facility], [address] [place], [place] [address]  Office Visit on 06/23/2023  Component Date Value Ref Range Status   Specific Gravity, UA 06/23/2023 1.020  1.005 - 1.030 Final   pH, UA 06/23/2023 6.0  5.0 - 7.5 Final   Color, UA 06/23/2023 Yellow  Yellow Final   Appearance Ur 06/23/2023 Cloudy (A)  Clear Final   Leukocytes,UA 06/23/2023 1+ (A)  Negative Final   Protein,UA 06/23/2023 1+ (A)  Negative/Trace Final   Glucose, UA 06/23/2023 Negative  Negative Final   Ketones, UA 06/23/2023 Negative  Negative Final   RBC, UA 06/23/2023 3+ (A)  Negative Final   Bilirubin, UA 06/23/2023 Negative  Negative Final   Urobilinogen, Ur 06/23/2023 1.0  0.2 - 1.0 mg/dL Final   Nitrite, UA 41/32/4401 Negative  Negative Final   Microscopic Examination 06/23/2023 See below:   Final   Microscopic was indicated and was performed.   Scan Result 06/23/2023 30ml   Final   Urine Culture, Comprehensive 06/23/2023 Preliminary report (A)   Preliminary   Organism ID, Bacteria 06/23/2023 Gram negative rods (A)   Preliminary   5,000  Colonies/mL   WBC, UA 06/23/2023 11-30 (A)  0 - 5 /hpf Final   RBC, Urine 06/23/2023 >30 (A)  0 - 2 /hpf Final   Epithelial Cells (non renal) 06/23/2023 0-10  0 - 10 /hpf Final   Casts 06/23/2023 Present (A)  None seen /lpf Final   Cast Type 06/23/2023 Hyaline casts  N/A Final   Crystals 06/23/2023 Present (A)  N/A Final   Crystal Type 06/23/2023 Calcium  Oxalate  N/A  Final   Mucus, UA 06/23/2023 Present (A)  Not Estab. Final   Bacteria, UA 06/23/2023 Moderate (A)  None seen/Few Final    ECG: Date: 06/25/2023  Time ECG obtained: 1338 PM Rate: 58 bpm Rhythm: sinus bradycardia Axis (leads I and aVF): normal Intervals: PR 160 ms. QRS 78 ms. QTc 457 ms. ST segment and T wave changes:  No evidence of acute T wave abnormalities or significant ST segment elevation or depression.  Evidence of a possible, age undetermined, prior infarct:  No Comparison: Similar to previous tracing obtained on 04/30/2022   IMAGING / PROCEDURES: CT RENAL STONE STUDY performed on 06/23/2023 There is a 8 x 9 mm left mid ureteric calculus causing mild-to-moderate proximal hydronephrosis and hydroureter. There are at least 3, additional nonobstructing calculi in the left kidney.  No right nephroureterolithiasis or obstructive uropathy. Multiple calcified gallstones in the contracted gallbladder. There is intrahepatic gallbladder.  There are fat containing umbilical and bilateral inguinal hernias. Aortic atherosclerosis  Emphysema    CT CHEST LUNG CA SCREEN LOW DOSE W/O CM performed on 04/13/2023 Clustered nodularity in the left upper lobe is similar and likely due to prior infectious bronchiolitis. Scattered tiny calcified nodules of up to 2.8 mm again identified. Lung-RADS 1, negative. Continue annual screening with low-dose chest CT without contrast in 12 months. Aortic atherosclerosis  3 vessel coronary artery atherosclerosis  Emphysema   TRANSTHORACIC ECHOCARDIOGRAM performed on 07/29/2021 Normal left ventricular systolic function with an EF of >55% Mild LVH No regional wall motion abnormalities Left ventricular diastolic Doppler parameters consistent with abnormal relaxation (G1DD). Normal right ventricular systolic function Normal pulmonary artery pressure Mild to moderate mitral valve regurgitation Normal gradients; no valvular stenosis No pericardial effusion    MYOCARDIAL PERFUSION IMAGING STUDY (LEXISCAN) performed on 05/01/2020 Normal left ventricular systolic function with a hyperdynamic LVEF of 74% Normal regional wall motion No evidence of stress-induced myocardial ischemia or arrhythmia; no scintigraphic evidence of scar Duke treadmill score 6 Able to achieve 7 METS following 6 minutes and 2 seconds of exercise Study determined to be normal and low risk.   IMPRESSION AND PLAN: MOHSIN CRUM has been referred for pre-anesthesia review and clearance prior to him undergoing the planned anesthetic and procedural courses. Available labs, pertinent testing, and imaging results were personally reviewed by me in preparation for upcoming operative/procedural course. [facility] medical record has been updated following extensive record review and patient interview with PAT staff.   This patient has been appropriately cleared by cardiology with an overall LOW risk of patient experiencing significant perioperative cardiovascular complications. Based on clinical review performed today (06/25/23), barring any significant acute changes in the patient's overall condition, it is anticipated that he will be able to proceed with the planned surgical intervention. Any acute changes in clinical condition may necessitate his procedure being postponed and/or cancelled. Patient will meet with anesthesia team (MD and/or CRNA) on the day of his procedure for preoperative evaluation/assessment. Questions regarding anesthetic course will be fielded at that time.   Pre-surgical instructions were reviewed with the patient during his PAT appointment, and questions were fielded to satisfaction by PAT clinical staff. He has been instructed on which medications that he will need to hold prior to surgery, as well as the ones that have been deemed safe/appropriate to take on the day of his procedure. As part of the general education provided by PAT, patient made aware both verbally  and in writing, that he would need to abstain from the use of any illegal substances during his perioperative course. He was advised that failure to follow the provided instructions could necessitate case cancellation or result in serious perioperative complications up to and including death. Patient encouraged to contact PAT and/or his surgeon's office to discuss any questions or concerns that may arise prior to surgery; verbalized understanding.   Renate Caroline, MSN, APRN, FNP-C, CEN [facility]  Perioperative Services Nurse Practitioner Phone: [phone] Fax: [phone] 06/25/23 12:13 PM  NOTE: This note has been prepared using Dragon dictation software. Despite my best ability to proofread, there is always the potential that unintentional transcriptional errors may still occur from this process.

## 2023-06-25 NOTE — Patient Instructions (Addendum)
 Your procedure is scheduled on: 06/26/23  Report to the Registration Desk on the 1st floor of the [facility]. To find out your arrival time, please call [phone] between 1PM - 3PM on: 06/25/23 If your arrival time is 6:00 am, do not arrive before that time as the [facility] entrance doors do not open until 6:00 am.  REMEMBER: Instructions that are not followed completely may result in serious medical risk, up to and including death; or upon the discretion of your surgeon and anesthesiologist your surgery may need to be rescheduled.  Do not eat food or drink any liquids after midnight the night before surgery.  No gum chewing or hard candies.  One week prior to surgery: Stop Anti-inflammatories (NSAIDS) such as Advil, Aleve, Ibuprofen, Motrin, Naproxen, Naprosyn and Aspirin  based products such as Excedrin, Goody's Powder, BC Powder. You may take Tylenol  if needed for pain up until the day of surgery.  Stop ANY OVER THE COUNTER supplements until after surgery.  Continue taking all of your other prescription medications up until the day of surgery.  ON THE DAY OF SURGERY ONLY TAKE THESE MEDICATIONS WITH SIPS OF WATER:  amLODipine (NORVASC)  brinzolamide  (AZOPT )  BREZTRI  AEROSPHERE)  dorzolamide (TRUSOPT)  Ensifentrine  (OHTUVAYRE )  oxyCODONE -acetaminophen  if needed sotalol  (BETAPACE )  BACTRIM  DS  tamsulosin  (FLOMAX )   Use inhalers on the day of surgery and bring to the hospital.   No Alcohol for 24 hours before or after surgery.  No Smoking including e-cigarettes for 24 hours before surgery.  No chewable tobacco products for at least 6 hours before surgery.  No nicotine patches on the day of surgery.  Do not use any "recreational" drugs for at least a week (preferably 2 weeks) before your surgery.  Please be advised that the combination of cocaine and anesthesia may have negative outcomes, up to and including death. If you test positive for cocaine, your surgery will be  cancelled.  On the morning of surgery brush your teeth with toothpaste and water, you may rinse your mouth with mouthwash if you wish. Do not swallow any toothpaste or mouthwash.  Do not wear jewelry, make-up, hairpins, clips or nail polish.  For welded (permanent) jewelry: bracelets, anklets, waist bands, etc.  Please have this removed prior to surgery.  If it is not removed, there is a chance that hospital personnel will need to cut it off on the day of surgery.  Do not wear lotions, powders, or perfumes.   Do not shave body hair from the neck down 48 hours before surgery.  Contact lenses, hearing aids and dentures may not be worn into surgery.  Do not bring valuables to the hospital. [facility] is not responsible for any missing/lost belongings or valuables.   Notify your doctor if there is any change in your medical condition (cold, fever, infection).  Wear comfortable clothing (specific to your surgery type) to the hospital.  After surgery, you can help prevent lung complications by doing breathing exercises.  Take deep breaths and cough every 1-2 hours. Your doctor may order a device called an Incentive Spirometer to help you take deep breaths. When coughing or sneezing, hold a pillow firmly against your incision with both hands. This is called "splinting." Doing this helps protect your incision. It also decreases belly discomfort.  If you are being admitted to the hospital overnight, leave your suitcase in the car. After surgery it may be brought to your room.  In case of increased patient census, it may  be necessary for you, the patient, to continue your postoperative care in the Same Day Surgery department.  If you are being discharged the day of surgery, you will not be allowed to drive home. You will need a responsible individual to drive you home and stay with you for 24 hours after surgery.   If you are taking public transportation, you will need to have a responsible  individual with you.  Please call the Pre-admissions Testing Dept. at [phone] if you have any questions about these instructions.  Surgery Visitation Policy:  Patients having surgery or a procedure may have two visitors.  Children under the age of 23 must have an adult with them who is not the patient.  Inpatient Visitation:    Visiting hours are 7 a.m. to 8 p.m. Up to four visitors are allowed at one time in a patient room. The visitors may rotate out with other people during the day.  One visitor age 35 or older may stay with the patient overnight and must be in the room by 8 p.m.

## 2023-06-26 ENCOUNTER — Ambulatory Visit: Admitting: Urgent Care

## 2023-06-26 ENCOUNTER — Ambulatory Visit: Admitting: Cardiovascular Disease

## 2023-06-26 ENCOUNTER — Encounter
Admission: RE | Disposition: A | Discharge status: Home / Self Care | Payer: Self-pay | Source: Home / Self Care | Attending: Urology

## 2023-06-26 ENCOUNTER — Other Ambulatory Visit: Payer: Self-pay

## 2023-06-26 ENCOUNTER — Encounter: Payer: Self-pay | Admitting: Urology

## 2023-06-26 ENCOUNTER — Ambulatory Visit
Admission: RE | Admit: 2023-06-26 | Discharge: 2023-06-26 | Disposition: A | Discharge status: Home / Self Care | Attending: Urology | Admitting: Urology

## 2023-06-26 ENCOUNTER — Ambulatory Visit

## 2023-06-26 DIAGNOSIS — N132 Hydronephrosis with renal and ureteral calculous obstruction: Secondary | ICD-10-CM | POA: Insufficient documentation

## 2023-06-26 DIAGNOSIS — I340 Nonrheumatic mitral (valve) insufficiency: Secondary | ICD-10-CM | POA: Insufficient documentation

## 2023-06-26 DIAGNOSIS — K219 Gastro-esophageal reflux disease without esophagitis: Secondary | ICD-10-CM | POA: Diagnosis not present

## 2023-06-26 DIAGNOSIS — I10 Essential (primary) hypertension: Secondary | ICD-10-CM | POA: Diagnosis not present

## 2023-06-26 DIAGNOSIS — N201 Calculus of ureter: Secondary | ICD-10-CM

## 2023-06-26 DIAGNOSIS — I700 Atherosclerosis of aorta: Secondary | ICD-10-CM | POA: Insufficient documentation

## 2023-06-26 DIAGNOSIS — N202 Calculus of kidney with calculus of ureter: Secondary | ICD-10-CM

## 2023-06-26 DIAGNOSIS — I4891 Unspecified atrial fibrillation: Secondary | ICD-10-CM | POA: Diagnosis not present

## 2023-06-26 DIAGNOSIS — I2510 Atherosclerotic heart disease of native coronary artery without angina pectoris: Secondary | ICD-10-CM | POA: Insufficient documentation

## 2023-06-26 DIAGNOSIS — J449 Chronic obstructive pulmonary disease, unspecified: Secondary | ICD-10-CM | POA: Diagnosis not present

## 2023-06-26 DIAGNOSIS — K760 Fatty (change of) liver, not elsewhere classified: Secondary | ICD-10-CM | POA: Insufficient documentation

## 2023-06-26 DIAGNOSIS — Z87891 Personal history of nicotine dependence: Secondary | ICD-10-CM | POA: Diagnosis not present

## 2023-06-26 DIAGNOSIS — E785 Hyperlipidemia, unspecified: Secondary | ICD-10-CM | POA: Insufficient documentation

## 2023-06-26 DIAGNOSIS — R0602 Shortness of breath: Secondary | ICD-10-CM | POA: Diagnosis not present

## 2023-06-26 DIAGNOSIS — M1990 Unspecified osteoarthritis, unspecified site: Secondary | ICD-10-CM | POA: Insufficient documentation

## 2023-06-26 HISTORY — DX: Umbilical hernia without obstruction or gangrene: K42.9

## 2023-06-26 HISTORY — PX: CYSTOSCOPY/URETEROSCOPY/HOLMIUM LASER/STENT PLACEMENT: SHX6546

## 2023-06-26 HISTORY — DX: Bilateral inguinal hernia, without obstruction or gangrene, not specified as recurrent: K40.20

## 2023-06-26 LAB — CULTURE, URINE COMPREHENSIVE

## 2023-06-26 SURGERY — CYSTOSCOPY/URETEROSCOPY/HOLMIUM LASER/STENT PLACEMENT
Anesthesia: General | Laterality: Left

## 2023-06-26 MED ORDER — SUGAMMADEX SODIUM 200 MG/2ML IV SOLN
INTRAVENOUS | Status: DC | PRN
  Administered 2023-06-26: 200 mg via INTRAVENOUS

## 2023-06-26 MED ORDER — ROCURONIUM BROMIDE 10 MG/ML (PF) SYRINGE
PREFILLED_SYRINGE | INTRAVENOUS | Status: AC
  Filled 2023-06-26: qty 10

## 2023-06-26 MED ORDER — SODIUM CHLORIDE 0.9 % IR SOLN
Status: DC | PRN
  Administered 2023-06-26: 3000 mL via INTRAVESICAL

## 2023-06-26 MED ORDER — EPHEDRINE SULFATE-NACL 50-0.9 MG/10ML-% IV SOSY
PREFILLED_SYRINGE | INTRAVENOUS | Status: DC | PRN
  Administered 2023-06-26 (×2): 10 mg via INTRAVENOUS

## 2023-06-26 MED ORDER — FENTANYL CITRATE (PF) 100 MCG/2ML IJ SOLN
INTRAMUSCULAR | Status: DC | PRN
  Administered 2023-06-26: 50 ug via INTRAVENOUS

## 2023-06-26 MED ORDER — PROPOFOL 10 MG/ML IV BOLUS
INTRAVENOUS | Status: DC | PRN
  Administered 2023-06-26: 100 mg via INTRAVENOUS

## 2023-06-26 MED ORDER — CHLORHEXIDINE GLUCONATE 0.12 % MT SOLN
OROMUCOSAL | Status: AC
  Filled 2023-06-26: qty 15

## 2023-06-26 MED ORDER — PHENYLEPHRINE 80 MCG/ML (10ML) SYRINGE FOR IV PUSH (FOR BLOOD PRESSURE SUPPORT)
PREFILLED_SYRINGE | INTRAVENOUS | Status: DC | PRN
  Administered 2023-06-26 (×3): 160 ug via INTRAVENOUS

## 2023-06-26 MED ORDER — IOHEXOL 180 MG/ML  SOLN
INTRAMUSCULAR | Status: DC | PRN
  Administered 2023-06-26: 10 mL

## 2023-06-26 MED ORDER — MIDAZOLAM HCL 2 MG/2ML IJ SOLN
INTRAMUSCULAR | Status: AC
  Filled 2023-06-26: qty 2

## 2023-06-26 MED ORDER — ACETAMINOPHEN 500 MG PO TABS
ORAL_TABLET | ORAL | Status: AC
  Filled 2023-06-26: qty 2

## 2023-06-26 MED ORDER — ONDANSETRON HCL 4 MG PO TABS: 4.0000 mg | ORAL_TABLET | Freq: Three times a day (TID) | ORAL | 0 refills | Status: DC | PRN

## 2023-06-26 MED ORDER — ROCURONIUM BROMIDE 100 MG/10ML IV SOLN
INTRAVENOUS | Status: DC | PRN
  Administered 2023-06-26: 30 mg via INTRAVENOUS

## 2023-06-26 MED ORDER — FENTANYL CITRATE (PF) 100 MCG/2ML IJ SOLN: 25.0000 ug | INTRAMUSCULAR | Status: DC | PRN

## 2023-06-26 MED ORDER — MIDAZOLAM HCL 2 MG/2ML IJ SOLN
INTRAMUSCULAR | Status: DC | PRN
  Administered 2023-06-26: 2 mg via INTRAVENOUS

## 2023-06-26 MED ORDER — DROPERIDOL 2.5 MG/ML IJ SOLN: 0.6250 mg | Freq: Once | INTRAMUSCULAR | Status: DC | PRN

## 2023-06-26 MED ORDER — ACETAMINOPHEN 10 MG/ML IV SOLN: 1000.0000 mg | Freq: Once | INTRAVENOUS | Status: DC | PRN

## 2023-06-26 MED ORDER — ONDANSETRON HCL 4 MG/2ML IJ SOLN
INTRAMUSCULAR | Status: DC | PRN
  Administered 2023-06-26: 4 mg via INTRAVENOUS

## 2023-06-26 MED ORDER — DOCUSATE SODIUM 100 MG PO CAPS: 100.0000 mg | ORAL_CAPSULE | Freq: Two times a day (BID) | ORAL | 0 refills | Status: AC

## 2023-06-26 MED ORDER — LIDOCAINE HCL (CARDIAC) PF 100 MG/5ML IV SOSY
PREFILLED_SYRINGE | INTRAVENOUS | Status: DC | PRN
  Administered 2023-06-26: 30 mg via INTRAVENOUS

## 2023-06-26 MED ORDER — FENTANYL CITRATE (PF) 100 MCG/2ML IJ SOLN
INTRAMUSCULAR | Status: AC
  Filled 2023-06-26: qty 2

## 2023-06-26 MED ORDER — OXYCODONE HCL 5 MG PO TABS: 5.0000 mg | ORAL_TABLET | Freq: Once | ORAL | Status: DC | PRN

## 2023-06-26 MED ORDER — DEXAMETHASONE SODIUM PHOSPHATE 10 MG/ML IJ SOLN
INTRAMUSCULAR | Status: DC | PRN
  Administered 2023-06-26: 5 mg via INTRAVENOUS

## 2023-06-26 MED ORDER — ACETAMINOPHEN 500 MG PO TABS
1000.0000 mg | ORAL_TABLET | Freq: Once | ORAL | Status: AC
  Administered 2023-06-26: 1000 mg via ORAL

## 2023-06-26 MED ORDER — LACTATED RINGERS IV SOLN: INTRAVENOUS | Status: DC | PRN

## 2023-06-26 MED ORDER — CEFAZOLIN SODIUM-DEXTROSE 2-4 GM/100ML-% IV SOLN
INTRAVENOUS | Status: AC
  Filled 2023-06-26: qty 100

## 2023-06-26 MED ORDER — OXYCODONE HCL 5 MG/5ML PO SOLN: 5.0000 mg | Freq: Once | ORAL | Status: DC | PRN

## 2023-06-26 SURGICAL SUPPLY — 25 items
ADHESIVE MASTISOL STRL (MISCELLANEOUS) IMPLANT
BAG DRAIN SIEMENS DORNER NS (MISCELLANEOUS) ×1 IMPLANT
BAG PRESSURE INF REUSE 3000 (BAG) ×1 IMPLANT
BRUSH SCRUB EZ 4% CHG (MISCELLANEOUS) ×1 IMPLANT
CATH URET FLEX-TIP 2 LUMEN 10F (CATHETERS) IMPLANT
CATH URETL OPEN 5X70 (CATHETERS) IMPLANT
CNTNR URN SCR LID CUP LEK RST (MISCELLANEOUS) IMPLANT
DRAPE UTILITY 15X26 TOWEL STRL (DRAPES) ×1 IMPLANT
DRSG TEGADERM 2-3/8X2-3/4 SM (GAUZE/BANDAGES/DRESSINGS) IMPLANT
FIBER LASER MOSES 365 DFL (Laser) IMPLANT
GLOVE BIOGEL PI IND STRL 7.5 (GLOVE) ×1 IMPLANT
GOWN STRL REUS W/ TWL LRG LVL3 (GOWN DISPOSABLE) ×1 IMPLANT
GOWN STRL REUS W/ TWL XL LVL3 (GOWN DISPOSABLE) ×1 IMPLANT
GUIDEWIRE STR DUAL SENSOR (WIRE) ×1 IMPLANT
KIT TURNOVER CYSTO (KITS) ×1 IMPLANT
PACK CYSTO AR (MISCELLANEOUS) ×1 IMPLANT
SET CYSTO W/LG BORE CLAMP LF (SET/KITS/TRAYS/PACK) ×1 IMPLANT
SHEATH NAVIGATOR HD 12/14X36 (SHEATH) IMPLANT
SOL .9 NS 3000ML IRR UROMATIC (IV SOLUTION) ×1 IMPLANT
STENT URET 6FRX24 CONTOUR (STENTS) IMPLANT
STENT URET 6FRX26 CONTOUR (STENTS) IMPLANT
SURGILUBE 2OZ TUBE FLIPTOP (MISCELLANEOUS) ×1 IMPLANT
SYR 10ML LL (SYRINGE) ×1 IMPLANT
VALVE UROSEAL ADJ ENDO (VALVE) IMPLANT
WATER STERILE IRR 500ML POUR (IV SOLUTION) ×1 IMPLANT

## 2023-06-26 NOTE — Op Note (Signed)
 Date of procedure: 06/26/23  Preoperative diagnosis:  Left ureteral stone Left renal stone  Postoperative diagnosis:  Same  Procedure: Cystoscopy, left retrograde pyelogram with intraoperative interpretation Left ureteroscopy, laser lithotripsy of mid ureteral stone Left ureteroscopy, laser lithotripsy of renal stone Left ureteral stent placement  Surgeon: Jay Meth, MD  Anesthesia: General  Complications: None  Intraoperative findings:  Diffusely narrowed urethra, but accommodated 21 French rigid cystoscope, small prostate Normal-appearing bladder, ureteral orifices orthotopic bilaterally Left mid ureteral stone dusted Left renal stone dusted Left ureteral stent placement  EBL: Minimal  Specimens: None  Drains: Left 6 French by 26 cm ureteral stent  Indication: Victor Cortez is a 73 y.o. patient with left-sided flank pain found to have a 1 cm left mid ureteral stone and opted for ureteroscopy.  After reviewing the management options for treatment, they elected to proceed with the above surgical procedure(s). We have discussed the potential benefits and risks of the procedure, side effects of the proposed treatment, the likelihood of the patient achieving the goals of the procedure, and any potential problems that might occur during the procedure or recuperation. Informed consent has been obtained.  Description of procedure:  The patient was taken to the operating room and general anesthesia was induced. SCDs were placed for DVT prophylaxis.. The patient was placed in the dorsal lithotomy position, prepped and draped in the usual sterile fashion, and preoperative antibiotics were administered. A preoperative time-out was performed.   A 21 French rigid cystoscope was used to intubate the urethra, the urethra was diffusely narrowed but I was able to advance the scope into the bladder, the prostate was small.  Thorough cystoscopy showed no suspicious lesions.  A sensor  wire was used to intubate the left ureteral orifice and advanced up to the kidney easily under fluoroscopic vision.  A semirigid long ureteroscope was advanced alongside the wire and there was a large yellow stone lodged in the mid ureter.  A 365 m laser fiber and settings of 1.0 J and 10 Hz was used to methodically dust the stone.  Fragments were irrigated free from the ureter.  I then added a second safety sensor wire through the scope and the semirigid scope was removed.  A digital single-channel flexible ureteroscope was advanced over the wire up to the kidney under fluoroscopic vision.  A 5 mm stone was located in the midpole and was dusted on settings of 0.5 J and 80 Hz.  Notably, challenging pyeloscopy with poor mobility within the kidney, no other stone seen.  A retrograde pyelogram performed from the proximal ureter showed no extravasation or filling defects.  Pullback ureteroscopy showed some small dust like fragments smaller than the laser fiber, but no larger fragments or ureteral injury.  The rigid cystoscope was backloaded over the wire 6 [place] by 26 cm ureteral stent was placed uneventfully with a curl in the kidney as well as in the bladder under direct vision.  The bladder was drained and this concluded our procedure.  Disposition: Stable to PACU  Plan: Schedule stent removal in clinic in 1 to 2 weeks  Jay Meth, MD

## 2023-06-26 NOTE — Anesthesia Procedure Notes (Signed)
 Procedure Name: Intubation Date/Time: 06/26/2023 1:23 PM  Performed by: Bill Budd, CRNAPre-anesthesia Checklist: Patient identified, Patient being monitored, Timeout performed, Emergency Drugs available and Suction available Patient Re-evaluated:Patient Re-evaluated prior to induction Oxygen Delivery Method: Circle system utilized Preoxygenation: Pre-oxygenation with 100% oxygen Induction Type: IV induction Ventilation: Mask ventilation without difficulty Laryngoscope Size: 4 and Glidescope Grade View: Grade I Tube type: Oral Tube size: 7.5 mm Number of attempts: 1 Airway Equipment and Method: Stylet Placement Confirmation: ETT inserted through vocal cords under direct vision, positive ETCO2 and breath sounds checked- equal and bilateral Secured at: 22 cm Tube secured with: Tape Dental Injury: Teeth and Oropharynx as per pre-operative assessment

## 2023-06-26 NOTE — Transfer of Care (Signed)
 Immediate Anesthesia Transfer of Care Note  Patient: Victor Cortez  Procedure(s) Performed: CYSTOSCOPY/URETEROSCOPY/HOLMIUM LASER/STENT PLACEMENT (Left)  Patient Location: PACU  Anesthesia Type:General  Level of Consciousness: drowsy  Airway & Oxygen Therapy: Patient Spontanous Breathing and Patient connected to nasal cannula oxygen  Post-op Assessment: Report given to RN and Post -op Vital signs reviewed and stable  Post vital signs: Reviewed and stable  Last Vitals:  Vitals Value Taken Time  BP 104/51 06/26/23 1403  Temp    Pulse 65 06/26/23 1408  Resp 17 06/26/23 1408  SpO2 99 % 06/26/23 1408  Vitals shown include unfiled device data.  Last Pain:  Vitals:   06/26/23 1055  TempSrc: Oral  PainSc: 0-No pain         Complications: No notable events documented.

## 2023-06-26 NOTE — Anesthesia Preprocedure Evaluation (Addendum)
 Anesthesia Evaluation  Patient identified by MRN, date of birth, ID band Patient awake    Reviewed: Allergy & Precautions, NPO status , Patient's Chart, lab work & pertinent test results  History of Anesthesia Complications Negative for: history of anesthetic complications  Airway Mallampati: I   Neck ROM: Full    Dental  (+) Upper Dentures, Partial Lower, Missing   Pulmonary shortness of breath and with exertion, COPD, former smoker Hx PTX 03/2019   Pulmonary exam normal breath sounds clear to auscultation       Cardiovascular hypertension, + CAD  Normal cardiovascular exam+ dysrhythmias (a fib)  Rhythm:Regular Rate:Normal  CCTA 03/2015 score 56, Mild mid RCA, mild distal LCX disease.  No significant disease in LAD  ECG 04/30/22: normal  TRANSTHORACIC ECHOCARDIOGRAM performed on 07/29/21: 1. Normal left ventricular systolic function with an EF of >55% 2. Mild LVH 3. No regional wall motion abnormalities 4. Left ventricular diastolic Doppler parameters consistent with abnormal relaxation (G1DD). 5. Normal right ventricular systolic function 6. Normal pulmonary artery pressure 7. Mild to moderate mitral valve regurgitation 8. Normal gradients; no valvular stenosis 9. No pericardial effusion   MYOCARDIAL PERFUSION 05/01/20: 1. Normal left ventricular systolic function with a hyperdynamic LVEF of 74% 2. Normal regional wall motion 3. No evidence of stress-induced myocardial ischemia or arrhythmia; no scintigraphic evidence of scar 4. Duke treadmill score 6 5. Able to achieve 7 METS following 6 minutes and 2 seconds of exercise 6. Study determined to be normal and low risk.    Neuro/Psych negative neurological ROS     GI/Hepatic ,GERD  Controlled,,  Endo/Other  negative endocrine ROS    Renal/GU Renal InsufficiencyRenal disease (nephrolithiasis)     Musculoskeletal  (+) Arthritis ,    Abdominal Normal abdominal  exam  (+)   Peds  Hematology negative hematology ROS (+)   Anesthesia Other Findings PRE-ANES eval reviewed  Reproductive/Obstetrics                             Anesthesia Physical Anesthesia Plan  ASA: 3  Anesthesia Plan: General   Post-op Pain Management:    Induction: Intravenous  PONV Risk Score and Plan: 2 and Ondansetron , Dexamethasone  and Treatment may vary due to age or medical condition  Airway Management Planned: Oral ETT  Additional Equipment:   Intra-op Plan:   Post-operative Plan: Extubation in OR  Informed Consent: I have reviewed the patients History and Physical, chart, labs and discussed the procedure including the risks, benefits and alternatives for the proposed anesthesia with the patient or authorized representative who has indicated his/her understanding and acceptance.     Dental advisory given  Plan Discussed with: CRNA  Anesthesia Plan Comments:         Anesthesia Quick Evaluation

## 2023-06-26 NOTE — Interval H&P Note (Signed)
 UROLOGY H&P UPDATE  Agree with prior H&P dated 06/23/2023 by Kathreen Pare, PA.  9 mm left mid ureteral stone, smaller upstream renal stones.  No evidence of sepsis, urinalysis equivocal and growing 5000 colonies E. coli, was started on Bactrim .  Cardiac: RRR Lungs: CTA bilaterally  Laterality: Left Procedure: Left ureteroscopy, laser lithotripsy, stent placement  We specifically discussed the risks ureteroscopy including bleeding, infection/sepsis, stent related symptoms including flank pain/urgency/frequency/incontinence/dysuria, ureteral injury, ureteral stricture, inability to access stone, or need for staged or additional procedures.   Lawerence Pressman, MD 06/26/2023

## 2023-06-29 NOTE — Anesthesia Postprocedure Evaluation (Signed)
 Anesthesia Post Note  Patient: NASIM HABEEB  Procedure(s) Performed: CYSTOSCOPY/URETEROSCOPY/HOLMIUM LASER/STENT PLACEMENT (Left)  Patient location during evaluation: PACU Anesthesia Type: General Level of consciousness: awake and alert Pain management: pain level controlled Vital Signs Assessment: post-procedure vital signs reviewed and stable Respiratory status: spontaneous breathing, nonlabored ventilation and respiratory function stable Cardiovascular status: blood pressure returned to baseline and stable Postop Assessment: no apparent nausea or vomiting Anesthetic complications: no   No notable events documented.   Last Vitals:  Vitals:   06/26/23 1430 06/26/23 1438  BP: 117/63 (!) 100/52  Pulse: 69 71  Resp: 14 16  Temp: (!) 36.1 C (!) 36.1 C  SpO2: 92% 93%    Last Pain:  Vitals:   06/26/23 1438  TempSrc: Temporal  PainSc: 0-No pain                 Baltazar Bonier

## 2023-06-30 ENCOUNTER — Encounter: Payer: Self-pay | Admitting: Urology

## 2023-06-30 ENCOUNTER — Ambulatory Visit: Payer: Self-pay | Admitting: Physician Assistant

## 2023-06-30 DIAGNOSIS — N138 Other obstructive and reflux uropathy: Secondary | ICD-10-CM

## 2023-07-01 MED ORDER — SULFAMETHOXAZOLE-TRIMETHOPRIM 800-160 MG PO TABS: 1.0000 | ORAL_TABLET | Freq: Two times a day (BID) | ORAL | 0 refills | Status: AC

## 2023-07-02 ENCOUNTER — Other Ambulatory Visit: Payer: Self-pay | Admitting: Cardiovascular Disease

## 2023-07-02 DIAGNOSIS — E782 Mixed hyperlipidemia: Secondary | ICD-10-CM

## 2023-07-06 ENCOUNTER — Other Ambulatory Visit: Payer: Self-pay | Admitting: Urology

## 2023-07-07 ENCOUNTER — Ambulatory Visit: Admitting: Cardiovascular Disease

## 2023-07-07 ENCOUNTER — Encounter: Payer: Self-pay | Admitting: Nurse Practitioner

## 2023-07-09 ENCOUNTER — Ambulatory Visit (INDEPENDENT_AMBULATORY_CARE_PROVIDER_SITE_OTHER): Admitting: Urology

## 2023-07-09 VITALS — BP 95/62 | HR 73 | Ht 66.0 in | Wt 152.4 lb

## 2023-07-09 DIAGNOSIS — N200 Calculus of kidney: Secondary | ICD-10-CM

## 2023-07-09 DIAGNOSIS — Z466 Encounter for fitting and adjustment of urinary device: Secondary | ICD-10-CM

## 2023-07-09 MED ORDER — SULFAMETHOXAZOLE-TRIMETHOPRIM 800-160 MG PO TABS
1.0000 | ORAL_TABLET | Freq: Once | ORAL | Status: AC
  Administered 2023-07-09: 1 via ORAL

## 2023-07-09 MED ORDER — SULFAMETHOXAZOLE-TRIMETHOPRIM 800-160 MG PO TABS: 1.0000 | ORAL_TABLET | Freq: Two times a day (BID) | ORAL | 0 refills | Status: DC

## 2023-07-09 MED ORDER — LIDOCAINE HCL URETHRAL/MUCOSAL 2 % EX GEL
1.0000 | Freq: Once | CUTANEOUS | Status: AC
  Administered 2023-07-09: 1 via URETHRAL

## 2023-07-09 NOTE — Patient Instructions (Signed)
 Victor Cortez

## 2023-07-09 NOTE — Progress Notes (Signed)
 Cystoscopy Procedure Note:  Indication: Stent removal s/p 06/26/2023 left ureteroscopy, laser lithotripsy, stent placement  Bactrim  given for prophylaxis  After informed consent and discussion of the procedure and its risks, Victor Cortez was positioned and prepped in the standard fashion. Cystoscopy was performed with a flexible cystoscope. The stent was grasped with flexible graspers and removed in its entirety. The patient tolerated the procedure well.  Findings: Uncomplicated stent removal  Assessment and Plan: We discussed general stone prevention strategies including adequate hydration with goal of producing 2.5 L of urine daily, increasing citric acid intake, increasing calcium  intake during high oxalate meals, minimizing animal protein, and decreasing salt intake. Information about dietary recommendations given today.   RTC 4 months PVR, KUB, discussed right hydrocele  Lawerence Pressman, MD 07/09/2023

## 2023-07-13 ENCOUNTER — Other Ambulatory Visit: Payer: Self-pay | Admitting: Urology

## 2023-07-14 ENCOUNTER — Other Ambulatory Visit: Payer: Self-pay | Admitting: Urology

## 2023-07-16 ENCOUNTER — Encounter: Payer: Self-pay | Admitting: Cardiovascular Disease

## 2023-07-16 ENCOUNTER — Ambulatory Visit (INDEPENDENT_AMBULATORY_CARE_PROVIDER_SITE_OTHER): Admitting: Cardiovascular Disease

## 2023-07-16 VITALS — BP 92/54 | HR 68 | Ht 66.0 in | Wt 147.6 lb

## 2023-07-16 DIAGNOSIS — R0789 Other chest pain: Secondary | ICD-10-CM | POA: Diagnosis not present

## 2023-07-16 DIAGNOSIS — I2510 Atherosclerotic heart disease of native coronary artery without angina pectoris: Secondary | ICD-10-CM

## 2023-07-16 DIAGNOSIS — I482 Chronic atrial fibrillation, unspecified: Secondary | ICD-10-CM

## 2023-07-16 DIAGNOSIS — I4891 Unspecified atrial fibrillation: Secondary | ICD-10-CM

## 2023-07-16 DIAGNOSIS — E782 Mixed hyperlipidemia: Secondary | ICD-10-CM | POA: Diagnosis not present

## 2023-07-16 DIAGNOSIS — I952 Hypotension due to drugs: Secondary | ICD-10-CM

## 2023-07-16 MED ORDER — LOSARTAN POTASSIUM 25 MG PO TABS: 25.0000 mg | ORAL_TABLET | Freq: Every day | ORAL | 1 refills | Status: DC

## 2023-07-16 NOTE — Progress Notes (Signed)
 Cardiology Office Note   Date:  07/16/2023   ID:  Victor Cortez, DOB May 15, 1950, MRN [id number]  PCP:  Claire Crick, MD  Cardiologist:  Debborah Fairly, MD      History of Present Illness: Victor Cortez is a 73 y.o. male who presents for  Chief Complaint  Patient presents with   Follow-up    Low BP    HPI    Past Medical History:  Diagnosis Date   Aortic atherosclerosis (HCC)    Arthritis    Atrial fibrillation with RVR (HCC) 06/2013   a.) CHA2DS2VASc = 3 (age, HTN, vascular disease history); b.) rate/rhythm maintained on oral sotalol ; no chronic anticoagulation   Basal cell carcinoma of nose 1998   Bilateral inguinal hernia    Cholelithiasis 10/2017   COPD (chronic obstructive pulmonary disease) (HCC)    Coronary artery calcification seen on CT scan    Diastolic dysfunction    a.) TTE 07/29/2021: EF >55%, mild LVH, mild-mod MR, G1DD   Dyspnea    ED (erectile dysfunction)    a.) on PDE5i (sildenafil ) PRN   Ex-smoker    GERD (gastroesophageal reflux disease)    Glaucoma    Hepatic steatosis    History of bilateral cataract extraction 07/2020   History of chicken pox    History of kidney stones    History of pneumonia 06/2013   with sepsis and [facility] hospitalization   Hyperlipidemia    Hypertension    Nephrolithiasis    Pneumonia    Right hydrocele    Rosacea    Seborrheic dermatitis    Umbilical hernia    Urethral stricture    Wears dentures    full upper, partial lower     Past Surgical History:  Procedure Laterality Date   CATARACT EXTRACTION W/PHACO Left 07/10/2020   Procedure: CATARACT EXTRACTION PHACO AND INTRAOCULAR LENS PLACEMENT (IOC) LEFT VIVITY TORIC 9.00 00:51.4;  Surgeon: Clair Crews, MD;  Location: [facility];  Service: Ophthalmology;  Laterality: Left;   CATARACT EXTRACTION W/PHACO Right 07/24/2020   Procedure: CATARACT EXTRACTION PHACO AND INTRAOCULAR LENS PLACEMENT (IOC) RIGHT;  Surgeon: Clair Crews, MD;   Location: [facility];  Service: Ophthalmology;  Laterality: Right;  6.65 0:41.0   COLONOSCOPY     CYSTOSCOPY WITH LITHOLAPAXY N/A 05/02/2022   Procedure: CYSTOSCOPY WITH LITHOLAPAXY;  Surgeon: Lawerence Pressman, MD;  Location: [facility];  Service: Urology;  Laterality: N/A;   CYSTOSCOPY WITH URETHRAL DILATATION  12/04/2017   Procedure: CYSTOSCOPY WITH URETHRAL DILATATION;  Surgeon: Lawerence Pressman, MD;  Location: [facility];  Service: Urology;;   CYSTOSCOPY WITH URETHRAL DILATATION N/A 05/02/2022   Procedure: CYSTOSCOPY  URETHRAL DILATATION;  Surgeon: Lawerence Pressman, MD;  Location: [facility];  Service: Urology;  Laterality: N/A;   CYSTOSCOPY/URETEROSCOPY/HOLMIUM LASER/STENT PLACEMENT Left 12/04/2017   Procedure: CYSTOSCOPY/URETEROSCOPY/HOLMIUM LASER/STENT PLACEMENT;  Surgeon: Lawerence Pressman, MD;  Location: [facility];  Service: Urology;  Laterality: Left;   CYSTOSCOPY/URETEROSCOPY/HOLMIUM LASER/STENT PLACEMENT Right 05/02/2022   Procedure: CYSTOSCOPY/URETEROSCOPY/HOLMIUM LASER/STENT PLACEMENT/RETROGRADE WITH NWGNFAOZ;  Surgeon: Lawerence Pressman, MD;  Location: [facility];  Service: Urology;  Laterality: Right;   CYSTOSCOPY/URETEROSCOPY/HOLMIUM LASER/STENT PLACEMENT Left 06/26/2023   Procedure: CYSTOSCOPY/URETEROSCOPY/HOLMIUM LASER/STENT PLACEMENT;  Surgeon: Lawerence Pressman, MD;  Location: [facility];  Service: Urology;  Laterality: Left;   ESOPHAGOGASTRODUODENOSCOPY (EGD) WITH PROPOFOL  N/A 12/21/2018   normal esophagus, normal stomach, peptic duodenitis - done for 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 90 tablet 1   albuterol  (VENTOLIN  HFA) 108 (90 Base) MCG/ACT inhaler INHALE 2 INHALATIONS INTO THE LUNGS EVERY 6 HOURS AS NEEDED FOR WHEEZING OR SHORTNESS OF BREATH. 8.5 g 6   ascorbic acid (VITAMIN C)  500 MG tablet Take 1,000 mg by mouth daily.     aspirin  EC 81 MG tablet Take 81 mg by mouth daily.     brinzolamide  (AZOPT ) 1 % ophthalmic suspension Place 1 drop into both eyes 2 (two) times daily.     budeson-glycopyrrolate -formoterol (BREZTRI  AEROSPHERE) 160-9-4.8 MCG/ACT AERO inhaler Inhale 2 puffs into the lungs in the morning and at bedtime. 10.7 g 5   calcium  carbonate (TUMS) 500 MG chewable tablet Chew 1 tablet (200 mg of elemental calcium  total) by mouth daily as needed for indigestion or heartburn. (Patient taking differently: Chew 1 tablet by mouth 4 (four) times daily as needed for indigestion or heartburn.)     Cholecalciferol (VITAMIN D3 MAXIMUM STRENGTH) 125 MCG (5000 UT) capsule Take 5,000 Units by mouth daily.     Cyanocobalamin  1000 MCG TBCR Take 1 tablet by mouth daily.     dorzolamide (TRUSOPT) 2 % ophthalmic solution Place 1 drop into both eyes 2 (two) times daily.     Ensifentrine  (OHTUVAYRE ) 3 MG/2.5ML SUSP Inhale 2.5 mLs into the lungs 2 (two) times daily.     EPINEPHrine  (PRIMATENE  MIST) 0.125 MG/ACT AERO Inhale 1 puff into the lungs daily as needed (shortness of breath).     Multiple Vitamin (MULTI-VITAMINS) TABS Take 2 tablets by mouth daily.     ondansetron  (ZOFRAN ) 4 MG tablet TAKE ONE TABLET BY MOUTH EVERY 8 HOURS AS NEEDED FOR NAUSEA OR VOMITING 8 tablet 0   rosuvastatin  (CRESTOR ) 40 MG tablet TAKE 1 TABLET BY MOUTH ONCE DAILY 90 tablet 0   sotalol  (BETAPACE ) 80 MG tablet TAKE ONE TABLET BY MOUTH TWICE DAILY 180 tablet 0   sulfamethoxazole -trimethoprim  (BACTRIM  DS) 800-160 MG tablet Take 1 tablet by mouth 2 (two) times daily. 10 tablet 0   tamsulosin  (FLOMAX ) 0.4 MG CAPS capsule TAKE 1 CAPSULE BY MOUTH EVERY DAY 90 capsule 3   TURMERIC-GINGER PO Take 2 capsules by mouth daily.     vitamin E 180 MG (400 UNITS) capsule Take 400 Units by mouth daily.     No current facility-administered medications for this visit.    Allergies:   Xarelto  [rivaroxaban ]    Social  History:   reports that he quit smoking about 10 years ago. His smoking use included cigarettes. He started smoking about 63 years ago. He has a 106 pack-year smoking history. He has been exposed to tobacco smoke. He has never used smokeless tobacco. He reports that he does not drink alcohol and does not use drugs.   Family History:  family history includes COPD in his father and mother; Cancer in his father.    ROS:     Review of Systems  Constitutional: Negative.   HENT: Negative.    Eyes: Negative.   Respiratory: Negative.    Gastrointestinal: Negative.   Genitourinary: Negative.   Musculoskeletal: Negative.   Skin: Negative.   Neurological: Negative.   Endo/Heme/Allergies: Negative.   Psychiatric/Behavioral: Negative.    All other systems reviewed and are negative.     All other systems are reviewed and negative.    PHYSICAL EXAM: VS:  BP (!) 92/54   Pulse 68   Ht 5' 6 (1.676 m)   Wt 147 lb 9.6 oz (67 kg)   SpO2 94%   BMI 23.82 kg/m  , BMI Body mass index is 23.82 kg/m. Last weight:  Wt Readings from Last 3 Encounters:  07/16/23 147 lb 9.6 oz (67 kg)  07/09/23 152 lb 6.4 oz (69.1 kg)  06/26/23 160 lb 15 oz (73 kg)     Physical Exam Vitals reviewed.  Constitutional:      Appearance: Normal appearance. He is normal weight.  HENT:     Head: Normocephalic.     Nose: Nose normal.     Mouth/Throat:     Mouth: Mucous membranes are moist.   Eyes:     Pupils: Pupils are equal, round, and reactive to light.    Cardiovascular:     Rate and Rhythm: Normal rate and regular rhythm.     Pulses: Normal pulses.     Heart sounds: Normal heart sounds.  Pulmonary:     Effort: Pulmonary effort is normal.  Abdominal:     General: Abdomen is flat. Bowel sounds are normal.   Musculoskeletal:        General: Normal range of motion.     Cervical back: Normal range of motion.   Skin:    General: Skin is warm.   Neurological:     General: No focal deficit present.      Mental Status: He is alert.   Psychiatric:        Mood and Affect: Mood normal.       EKG:   Recent Labs: 06/25/2023: BUN 36; Creatinine, Ser 3.57; Hemoglobin 13.3; Platelets 244; Potassium 3.9; Sodium 139    Lipid Panel    Component Value Date/Time   CHOL 202 (H) 11/16/2017 1538   TRIG 221.0 (H) 11/16/2017 1538   TRIG 75 04/28/2013 0000   HDL 40.90 11/16/2017 1538   CHOLHDL 5 11/16/2017 1538   VLDL 44.2 (H) 11/16/2017 1538   LDLCALC 154 (H) 08/31/2015 0900   LDLDIRECT 152.0 11/16/2017 1538      Other studies Reviewed: Additional studies/ records that were reviewed today include:  Review of the above records demonstrates:       No data to display            ASSESSMENT AND PLAN:    ICD-10-CM   1. Coronary artery disease involving native coronary artery of native heart without angina pectoris  I25.10 losartan  (COZAAR ) 25 MG tablet    PCV ECHOCARDIOGRAM COMPLETE    PCV ECHOCARDIOGRAM COMPLETE    2. Chronic atrial fibrillation (HCC)  I48.20 losartan  (COZAAR ) 25 MG tablet    PCV ECHOCARDIOGRAM COMPLETE    PCV ECHOCARDIOGRAM COMPLETE   continue sotolol    3. Mixed hyperlipidemia  E78.2 losartan  (COZAAR ) 25 MG tablet    PCV ECHOCARDIOGRAM COMPLETE    PCV ECHOCARDIOGRAM COMPLETE    4. Other chest pain  R07.89 losartan  (COZAAR ) 25 MG tablet    PCV ECHOCARDIOGRAM COMPLETE    PCV ECHOCARDIOGRAM COMPLETE   infrequent    5. Lone atrial fibrillation with RVR  I48.91 losartan  (COZAAR ) 25 MG tablet    PCV ECHOCARDIOGRAM COMPLETE    PCV ECHOCARDIOGRAM COMPLETE    6. Hypotension due to drugs  I95.2 losartan  (COZAAR ) 25 MG tablet    PCV ECHOCARDIOGRAM COMPLETE    PCV ECHOCARDIOGRAM COMPLETE   Symptomatic with dizziness, change losartan  100 to 25 mg daily. creat 3.57, as had  kidney stones. Stop amlodapine       Problem List Items Addressed This Visit       Cardiovascular and Mediastinum   Lone atrial fibrillation with RVR   Relevant Medications   losartan   (COZAAR ) 25 MG tablet   Other Relevant Orders   PCV ECHOCARDIOGRAM COMPLETE   PCV ECHOCARDIOGRAM COMPLETE   CAD (coronary artery disease) - Primary   Relevant Medications   losartan  (COZAAR ) 25 MG tablet   Other Relevant Orders   PCV ECHOCARDIOGRAM COMPLETE   PCV ECHOCARDIOGRAM COMPLETE   Chronic atrial fibrillation (HCC)   Relevant Medications   losartan  (COZAAR ) 25 MG tablet   Other Relevant Orders   PCV ECHOCARDIOGRAM COMPLETE   PCV ECHOCARDIOGRAM COMPLETE     Other   Hyperlipidemia   Relevant Medications   losartan  (COZAAR ) 25 MG tablet   Other Relevant Orders   PCV ECHOCARDIOGRAM COMPLETE   PCV ECHOCARDIOGRAM COMPLETE   Other Visit Diagnoses       Other chest pain       infrequent   Relevant Medications   losartan  (COZAAR ) 25 MG tablet   Other Relevant Orders   PCV ECHOCARDIOGRAM COMPLETE   PCV ECHOCARDIOGRAM COMPLETE     Hypotension due to drugs       Symptomatic with dizziness, change losartan  100 to 25 mg daily. creat 3.57, as had kidney stones. Stop amlodapine   Relevant Medications   losartan  (COZAAR ) 25 MG tablet   Other Relevant Orders   PCV ECHOCARDIOGRAM COMPLETE   PCV ECHOCARDIOGRAM COMPLETE          Disposition:   Return in about 1 week (around 07/23/2023).    Total time spent: 30 minutes  Signed,  Debborah Fairly, MD  07/16/2023 2:52 PM    [facility]

## 2023-07-16 NOTE — Patient Instructions (Signed)
 STOP AMLODAPINE. TAKE NEW LOSARTAN  25 MG DAILY. CONTINE ASPREIN AND SOTOLOL.

## 2023-07-20 ENCOUNTER — Other Ambulatory Visit

## 2023-07-23 ENCOUNTER — Ambulatory Visit: Admitting: Pulmonary Disease

## 2023-07-23 ENCOUNTER — Encounter: Payer: Self-pay | Admitting: Pulmonary Disease

## 2023-07-23 ENCOUNTER — Ambulatory Visit: Admitting: Cardiovascular Disease

## 2023-07-23 VITALS — BP 110/60 | HR 62 | Temp 97.1°F | Ht 66.0 in | Wt 152.8 lb

## 2023-07-23 DIAGNOSIS — J449 Chronic obstructive pulmonary disease, unspecified: Secondary | ICD-10-CM

## 2023-07-23 DIAGNOSIS — J439 Emphysema, unspecified: Secondary | ICD-10-CM | POA: Diagnosis not present

## 2023-07-23 DIAGNOSIS — J329 Chronic sinusitis, unspecified: Secondary | ICD-10-CM | POA: Diagnosis not present

## 2023-07-23 DIAGNOSIS — Z87891 Personal history of nicotine dependence: Secondary | ICD-10-CM

## 2023-07-23 DIAGNOSIS — J4489 Other specified chronic obstructive pulmonary disease: Secondary | ICD-10-CM

## 2023-07-23 LAB — PULMONARY FUNCTION TEST
DL/VA % pred: 61 %
DL/VA: 2.44 ml/min/mmHg/L
DLCO unc % pred: 49 %
DLCO unc: 10.86 ml/min/mmHg
FEF 25-75 Post: 0.67 L/s
FEF 25-75 Pre: 0.42 L/s
FEF2575-%Change-Post: 59 %
FEF2575-%Pred-Post: 35 %
FEF2575-%Pred-Pre: 22 %
FEV1-%Change-Post: 18 %
FEV1-%Pred-Post: 45 %
FEV1-%Pred-Pre: 38 %
FEV1-Post: 1.17 L
FEV1-Pre: 0.99 L
FEV1FVC-%Change-Post: 0 %
FEV1FVC-%Pred-Pre: 63 %
FEV6-%Change-Post: 18 %
FEV6-%Pred-Post: 72 %
FEV6-%Pred-Pre: 61 %
FEV6-Post: 2.42 L
FEV6-Pre: 2.04 L
FEV6FVC-%Change-Post: 0 %
FEV6FVC-%Pred-Post: 101 %
FEV6FVC-%Pred-Pre: 102 %
FVC-%Change-Post: 19 %
FVC-%Pred-Post: 71 %
FVC-%Pred-Pre: 59 %
FVC-Post: 2.56 L
FVC-Pre: 2.14 L
Post FEV1/FVC ratio: 46 %
Post FEV6/FVC ratio: 95 %
Pre FEV1/FVC ratio: 46 %
Pre FEV6/FVC Ratio: 95 %
RV % pred: 296 %
RV: 6.92 L
TLC % pred: 150 %
TLC: 9.37 L

## 2023-07-23 MED ORDER — LEVALBUTEROL TARTRATE 45 MCG/ACT IN AERO: 2.0000 | INHALATION_SPRAY | RESPIRATORY_TRACT | 2 refills | Status: DC | PRN

## 2023-07-23 NOTE — Progress Notes (Unsigned)
 Subjective:    Patient ID: Victor Cortez, male    DOB: 26-Nov-1950, 73 y.o.   MRN: [id number]  Patient Care Team: Claire Crick, MD as PCP - General (Family Medicine) Thelda Finney Lucie Ruts, DO (Inactive) as Consulting Physician (Pulmonary Disease)  Chief Complaint  Patient presents with   Follow-up    Wheezing. DOE. Dry cough.     BACKGROUND:73 year old male, former very heavy smoker followed for emphysema and lung nodules. He is a former patient of Dr. Glenis Langdon and last seen in office 04/11/2021 by Dr.Icard.  Last seen here by Girard Lam, NP on 27 May 2023.  Past medical history significant for CAD, hx of a fib, GERD, hepatic steatosis, HLD. He has a history of ptx treated with tube thoracostomy by Dr. Nicanor Barge December 2021.  Patient is transitioning care to me today.   HPI       TEST/EVENTS:  12/11/2022 echo: nl EF, RV function. Mild TR. Mild PH. Mild mitral regurgitation.  04/13/2023 LDCT chest: atherosclerosis/CAD. Mild to moderate emphysema. Clustered nodularity in the LUL, likely due to prior infectious bronchiolitis. Scattered tiny calcified nodules, up to 2.8 mm. Lung RADS 1.   04/11/2021: OV with Dr. Thelda Finney. Prior 8 mm nodule in RUL decreased on repeat Super D CT. Emphysema on imaging. Started stiolto at last visit. Did feel like it helped. Does carry around albuterol . Still having SOB and wheezing. Quite smoking around 7 years ago; approx 106 pack year hx. Has not had PFTs yet. Start Breztri . Can re-enroll in lung cancer screening program starting March 2024.   Review of Systems A 10 point review of systems was performed and it is as noted above otherwise negative.   Past Medical History:  Diagnosis Date   Aortic atherosclerosis (HCC)    Arthritis    Atrial fibrillation with RVR (HCC) 06/2013   a.) CHA2DS2VASc = 3 (age, HTN, vascular disease history); b.) rate/rhythm maintained on oral sotalol ; no chronic anticoagulation   Basal cell carcinoma of nose 1998    Bilateral inguinal hernia    Cholelithiasis 10/2017   COPD (chronic obstructive pulmonary disease) (HCC)    Coronary artery calcification seen on CT scan    Diastolic dysfunction    a.) TTE 07/29/2021: EF >55%, mild LVH, mild-mod MR, G1DD   Dyspnea    ED (erectile dysfunction)    a.) on PDE5i (sildenafil ) PRN   Ex-smoker    GERD (gastroesophageal reflux disease)    Glaucoma    Hepatic steatosis    History of bilateral cataract extraction 07/2020   History of chicken pox    History of kidney stones    History of pneumonia 06/2013   with sepsis and [facility] hospitalization   Hyperlipidemia    Hypertension    Nephrolithiasis    Pneumonia    Right hydrocele    Rosacea    Seborrheic dermatitis    Umbilical hernia    Urethral stricture    Wears dentures    full upper, partial lower    Past Surgical History:  Procedure Laterality Date   CATARACT EXTRACTION W/PHACO Left 07/10/2020   Procedure: CATARACT EXTRACTION PHACO AND INTRAOCULAR LENS PLACEMENT (IOC) LEFT VIVITY TORIC 9.00 00:51.4;  Surgeon: Clair Crews, MD;  Location: [facility];  Service: Ophthalmology;  Laterality: Left;   CATARACT EXTRACTION W/PHACO Right 07/24/2020   Procedure: CATARACT EXTRACTION PHACO AND INTRAOCULAR LENS PLACEMENT (IOC) RIGHT;  Surgeon: Clair Crews, MD;  Location: [facility];  Service: Ophthalmology;  Laterality: Right;  6.65 0:41.0   COLONOSCOPY     CYSTOSCOPY WITH LITHOLAPAXY N/A 05/02/2022   Procedure: CYSTOSCOPY WITH LITHOLAPAXY;  Surgeon: Lawerence Pressman, MD;  Location: [facility];  Service: Urology;  Laterality: N/A;   CYSTOSCOPY WITH URETHRAL DILATATION  12/04/2017   Procedure: CYSTOSCOPY WITH URETHRAL DILATATION;  Surgeon: Lawerence Pressman, MD;  Location: [facility];  Service: Urology;;   CYSTOSCOPY WITH URETHRAL DILATATION N/A 05/02/2022   Procedure: CYSTOSCOPY  URETHRAL DILATATION;  Surgeon: Lawerence Pressman, MD;  Location: [facility];  Service: Urology;  Laterality:  N/A;   CYSTOSCOPY/URETEROSCOPY/HOLMIUM LASER/STENT PLACEMENT Left 12/04/2017   Procedure: CYSTOSCOPY/URETEROSCOPY/HOLMIUM LASER/STENT PLACEMENT;  Surgeon: Lawerence Pressman, MD;  Location: [facility];  Service: Urology;  Laterality: Left;   CYSTOSCOPY/URETEROSCOPY/HOLMIUM LASER/STENT PLACEMENT Right 05/02/2022   Procedure: CYSTOSCOPY/URETEROSCOPY/HOLMIUM LASER/STENT PLACEMENT/RETROGRADE WITH ZOXWRUEA;  Surgeon: Lawerence Pressman, MD;  Location: [facility];  Service: Urology;  Laterality: Right;   CYSTOSCOPY/URETEROSCOPY/HOLMIUM LASER/STENT PLACEMENT Left 06/26/2023   Procedure: CYSTOSCOPY/URETEROSCOPY/HOLMIUM LASER/STENT PLACEMENT;  Surgeon: Lawerence Pressman, MD;  Location: [facility];  Service: Urology;  Laterality: Left;   ESOPHAGOGASTRODUODENOSCOPY (EGD) WITH PROPOFOL  N/A 12/21/2018   normal esophagus, normal stomach, peptic duodenitis - done for 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 Luke Salaam, MD)   ROTATOR CUFF REPAIR Right 2016    Patient Active Problem List   Diagnosis Date Noted   Right ureteral stone 04/18/2022   Acquired renal cyst of left kidney 04/18/2022   Hydronephrosis with urinary obstruction due to ureteral calculus 04/18/2022   Left lower lobe consolidation (HCC) 03/11/2019   Chronic atrial fibrillation (HCC)    Pneumothorax, left 03/07/2019   Cholelithiasis 03/01/2019   Hepatic steatosis 03/01/2019   Lymphangioma 03/01/2019   Duodenitis determined by biopsy 03/01/2019   Right hand pain 03/01/2019   Pre-op evaluation 11/16/2017   Abnormal CT scan, gallbladder 11/16/2017   Rash of face 11/16/2017   Left ureteral stone 11/05/2017   CAD (coronary artery disease) 09/29/2016   Medicare annual wellness visit, initial 08/31/2015   Advanced care planning/counseling discussion 08/31/2015   Right hydrocele 11/21/2014   Right anterior shoulder pain 02/23/2014   Renal insufficiency 02/23/2014   Ex-smoker 07/11/2013   COPD (chronic obstructive pulmonary disease) (HCC)    Glaucoma     Hyperlipidemia    GERD (gastroesophageal reflux disease)    History of pneumonia 06/03/2013   Lone atrial fibrillation with RVR 06/03/2013    Family History  Problem Relation Age of Onset   COPD Father    Cancer Father        lung (smoker)   COPD Mother    CAD Neg Hx    Stroke Neg Hx    Bladder Cancer Neg Hx    Prostate cancer Neg Hx     Social History   Tobacco Use   Smoking status: Former    Current packs/day: 0.00    Average packs/day: 2.0 packs/day for 53.0 years (106.0 ttl pk-yrs)    Types: Cigarettes    Start date: 06/03/1960    Quit date: 06/03/2013    Years since quitting: 10.1    Passive exposure: Past   Smokeless tobacco: Never  Substance Use Topics   Alcohol use: No    Allergies  Allergen Reactions   Xarelto  [Rivaroxaban ] Cough    Coughed up blood    Current Meds  Medication Sig   albuterol  (VENTOLIN  HFA) 108 (90 Base) MCG/ACT inhaler INHALE 2 INHALATIONS INTO THE LUNGS EVERY 6 HOURS AS NEEDED FOR WHEEZING OR SHORTNESS OF BREATH.  ascorbic acid (VITAMIN C) 500 MG tablet Take 1,000 mg by mouth daily.   aspirin  EC 81 MG tablet Take 81 mg by mouth daily.   brinzolamide  (AZOPT ) 1 % ophthalmic suspension Place 1 drop into both eyes 2 (two) times daily.   budeson-glycopyrrolate -formoterol (BREZTRI  AEROSPHERE) 160-9-4.8 MCG/ACT AERO inhaler Inhale 2 puffs into the lungs in the morning and at bedtime.   calcium  carbonate (TUMS) 500 MG chewable tablet Chew 1 tablet (200 mg of elemental calcium  total) by mouth daily as needed for indigestion or heartburn. (Patient taking differently: Chew 1 tablet by mouth 4 (four) times daily as needed for indigestion or heartburn.)   Cholecalciferol (VITAMIN D3 MAXIMUM STRENGTH) 125 MCG (5000 UT) capsule Take 5,000 Units by mouth daily.   clotrimazole (MYCELEX) 10 MG troche Take 10 mg by mouth as needed.   Cyanocobalamin  1000 MCG TBCR Take 1 tablet by mouth daily.   dorzolamide (TRUSOPT) 2 % ophthalmic solution Place 1 drop into  both eyes 2 (two) times daily.   Ensifentrine  (OHTUVAYRE ) 3 MG/2.5ML SUSP Inhale 2.5 mLs into the lungs 2 (two) times daily.   EPINEPHrine  (PRIMATENE  MIST) 0.125 MG/ACT AERO Inhale 1 puff into the lungs daily as needed (shortness of breath).   losartan  (COZAAR ) 25 MG tablet Take 1 tablet (25 mg total) by mouth daily.   Multiple Vitamin (MULTI-VITAMINS) TABS Take 2 tablets by mouth daily.   ondansetron  (ZOFRAN ) 4 MG tablet TAKE ONE TABLET BY MOUTH EVERY 8 HOURS AS NEEDED FOR NAUSEA OR VOMITING   rosuvastatin  (CRESTOR ) 40 MG tablet TAKE 1 TABLET BY MOUTH ONCE DAILY   sotalol  (BETAPACE ) 80 MG tablet TAKE ONE TABLET BY MOUTH TWICE DAILY   tamsulosin  (FLOMAX ) 0.4 MG CAPS capsule TAKE 1 CAPSULE BY MOUTH EVERY DAY   TURMERIC-GINGER PO Take 2 capsules by mouth daily.   vitamin E 180 MG (400 UNITS) capsule Take 400 Units by mouth daily.    Immunization History  Administered Date(s) Administered   Fluad Quad(high Dose 65+) 10/31/2021   Hepatitis B 04/07/2005, 05/08/2005, 07/02/2005   Influenza, High Dose Seasonal PF 11/16/2018, 10/26/2019   Influenza,inj,Quad PF,6+ Mos 11/07/2014   Influenza-Unspecified 12/04/2017   Moderna Covid-19 Fall Seasonal Vaccine 96yrs & older 11/20/2021   Moderna SARS-COV2 Booster Vaccination 02/17/2020   Moderna Sars-Covid-2 Vaccination 04/27/2019, 05/25/2019   Pneumococcal Conjugate-13 07/11/2013   Pneumococcal Polysaccharide-23 02/03/2010, 08/31/2015   Td 08/04/2007   Tdap 11/25/2012   Zoster Recombinant(Shingrix) 02/19/2018, 06/08/2018   Zoster, Live 05/06/2011        Objective:     BP 110/60 (BP Location: Right Arm, Cuff Size: Normal)   Pulse 62   Temp (!) 97.1 F (36.2 C)   Ht 5' 6 (1.676 m)   Wt 152 lb 12.8 oz (69.3 kg)   SpO2 100%   BMI 24.66 kg/m   SpO2: 100 % O2 Device: None (Room air)  GENERAL: HEAD: Normocephalic, atraumatic.  EYES: Pupils equal, round, reactive to light.  No scleral icterus.  MOUTH:  NECK: Supple. No thyromegaly.  Trachea midline. No JVD.  No adenopathy. PULMONARY: Good air entry bilaterally.  No adventitious sounds. CARDIOVASCULAR: S1 and S2. Regular rate and rhythm.  ABDOMEN: MUSCULOSKELETAL: No joint deformity, no clubbing, no edema.  NEUROLOGIC:  SKIN: Intact,warm,dry. PSYCH:        Assessment & Plan:     ICD-10-CM   1. Stage 3 severe COPD by GOLD classification (HCC)  J44.9     2. Pulmonary emphysema, unspecified emphysema type (HCC)  J43.9  3. Chronic rhinosinusitis  J32.9       No orders of the defined types were placed in this encounter.   No orders of the defined types were placed in this encounter.    Advised if symptoms do not improve or worsen, to please contact office for sooner follow up or seek emergency care.    I spent xxx minutes of dedicated to the care of this patient on the date of this encounter to include pre-visit review of records, face-to-face time with the patient discussing conditions above, post visit ordering of testing, clinical documentation with the electronic health record, making appropriate referrals as documented, and communicating necessary findings to members of the patients care team.   C. Chloe Counter, MD Advanced Bronchoscopy [facility]    *This note was dictated using voice recognition software/Dragon.  Despite best efforts to proofread, errors can occur which can change the meaning. Any transcriptional errors that result from this process are unintentional and may not be fully corrected at the time of dictation.

## 2023-07-23 NOTE — Progress Notes (Signed)
 Full PFT completed today ? ?

## 2023-07-23 NOTE — Patient Instructions (Signed)
 Full PFT completed today ? ?

## 2023-07-23 NOTE — Patient Instructions (Signed)
 VISIT SUMMARY:  You came in today for a follow-up visit regarding your COPD. We discussed your current symptoms, medications, and any concerns you have about your treatment plan.  YOUR PLAN:  -COPD, STAGE 3: Stage 3 COPD means that your lung function is significantly reduced, which can cause difficulty breathing, especially during physical activities. Your lung function is currently at 38%, improving to 45% after using albuterol . We will start you on Xopenex as a new rescue inhaler to see if it works better for you. Please use Ohtuvayre  twice daily, ideally after using Breztri . We will also do blood work to check for allergies and hereditary emphysema. Please follow up in 3 months.  INSTRUCTIONS:  Please schedule a follow-up appointment in 3 months. Make sure to get the blood work done as ordered.

## 2023-07-27 ENCOUNTER — Encounter: Payer: Self-pay | Admitting: Pulmonary Disease

## 2023-07-27 ENCOUNTER — Ambulatory Visit (INDEPENDENT_AMBULATORY_CARE_PROVIDER_SITE_OTHER)

## 2023-07-27 DIAGNOSIS — I2510 Atherosclerotic heart disease of native coronary artery without angina pectoris: Secondary | ICD-10-CM

## 2023-07-27 DIAGNOSIS — I4891 Unspecified atrial fibrillation: Secondary | ICD-10-CM

## 2023-07-27 DIAGNOSIS — I482 Chronic atrial fibrillation, unspecified: Secondary | ICD-10-CM

## 2023-07-27 DIAGNOSIS — E782 Mixed hyperlipidemia: Secondary | ICD-10-CM

## 2023-07-27 DIAGNOSIS — R0789 Other chest pain: Secondary | ICD-10-CM

## 2023-07-27 DIAGNOSIS — I361 Nonrheumatic tricuspid (valve) insufficiency: Secondary | ICD-10-CM | POA: Diagnosis not present

## 2023-07-27 DIAGNOSIS — I952 Hypotension due to drugs: Secondary | ICD-10-CM

## 2023-07-27 DIAGNOSIS — I340 Nonrheumatic mitral (valve) insufficiency: Secondary | ICD-10-CM

## 2023-07-29 ENCOUNTER — Other Ambulatory Visit
Admission: RE | Admit: 2023-07-29 | Discharge: 2023-07-29 | Disposition: A | Discharge status: Home / Self Care | Attending: Pulmonary Disease | Admitting: Pulmonary Disease

## 2023-07-29 DIAGNOSIS — J329 Chronic sinusitis, unspecified: Secondary | ICD-10-CM | POA: Diagnosis present

## 2023-07-30 ENCOUNTER — Encounter: Payer: Self-pay | Admitting: Cardiovascular Disease

## 2023-07-30 ENCOUNTER — Ambulatory Visit (INDEPENDENT_AMBULATORY_CARE_PROVIDER_SITE_OTHER): Admitting: Cardiovascular Disease

## 2023-07-30 VITALS — BP 112/72 | HR 83 | Ht 66.0 in | Wt 156.4 lb

## 2023-07-30 DIAGNOSIS — I952 Hypotension due to drugs: Secondary | ICD-10-CM | POA: Diagnosis not present

## 2023-07-30 DIAGNOSIS — I4891 Unspecified atrial fibrillation: Secondary | ICD-10-CM

## 2023-07-30 DIAGNOSIS — E782 Mixed hyperlipidemia: Secondary | ICD-10-CM | POA: Diagnosis not present

## 2023-07-30 DIAGNOSIS — I2510 Atherosclerotic heart disease of native coronary artery without angina pectoris: Secondary | ICD-10-CM | POA: Diagnosis not present

## 2023-07-30 DIAGNOSIS — R0789 Other chest pain: Secondary | ICD-10-CM

## 2023-07-30 DIAGNOSIS — I340 Nonrheumatic mitral (valve) insufficiency: Secondary | ICD-10-CM

## 2023-07-30 DIAGNOSIS — I482 Chronic atrial fibrillation, unspecified: Secondary | ICD-10-CM

## 2023-07-30 LAB — ALPHA-1-ANTITRYPSIN PHENOTYP: A-1 Antitrypsin, Ser: 147 mg/dL (ref 101–187)

## 2023-07-30 NOTE — Progress Notes (Signed)
 Cardiology Office Note   Date:  07/30/2023   ID:  HECTOR TAFT, DOB [birth year], MRN [id number]  PCP:  Rilla Baller, MD  Cardiologist:  Denyse Bathe, MD      History of Present Illness: Victor Cortez is a 73 y.o. male who presents for  Chief Complaint  Patient presents with   Follow-up    1 week follow up    Patient no longer feeling dizziness or chest pain or shortness of breath.  Patient came on last visit a week or so ago with dizziness and blood pressure was very low.  He was on amlodipine as well as losartan  100 mg.  Amlodipine was discontinued and losartan  was changed to 25 mg once a day.  Now presents with follow-up denying any dizziness.  He says he is feeling much better.      Past Medical History:  Diagnosis Date   Aortic atherosclerosis (HCC)    Arthritis    Atrial fibrillation with RVR (HCC) 06/2013   a.) CHA2DS2VASc = 3 (age, HTN, vascular disease history); b.) rate/rhythm maintained on oral sotalol ; no chronic anticoagulation   Basal cell carcinoma of nose 1998   Bilateral inguinal hernia    Cholelithiasis 10/2017   COPD (chronic obstructive pulmonary disease) (HCC)    Coronary artery calcification seen on CT scan    Diastolic dysfunction    a.) TTE 07/29/2021: EF >55%, mild LVH, mild-mod MR, G1DD   Dyspnea    ED (erectile dysfunction)    a.) on PDE5i (sildenafil ) PRN   Ex-smoker    GERD (gastroesophageal reflux disease)    Glaucoma    Hepatic steatosis    History of bilateral cataract extraction 07/2020   History of chicken pox    History of kidney stones    History of pneumonia 06/2013   with sepsis and [facility] hospitalization   Hyperlipidemia    Hypertension    Nephrolithiasis    Pneumonia    Right hydrocele    Rosacea    Seborrheic dermatitis    Umbilical hernia    Urethral stricture    Wears dentures    full upper, partial lower     Past Surgical History:  Procedure Laterality Date   CATARACT EXTRACTION W/PHACO Left  07/10/2020   Procedure: CATARACT EXTRACTION PHACO AND INTRAOCULAR LENS PLACEMENT (IOC) LEFT VIVITY TORIC 9.00 00:51.4;  Surgeon: Jaye Fallow, MD;  Location: [facility];  Service: Ophthalmology;  Laterality: Left;   CATARACT EXTRACTION W/PHACO Right 07/24/2020   Procedure: CATARACT EXTRACTION PHACO AND INTRAOCULAR LENS PLACEMENT (IOC) RIGHT;  Surgeon: Jaye Fallow, MD;  Location: [facility];  Service: Ophthalmology;  Laterality: Right;  6.65 0:41.0   COLONOSCOPY     CYSTOSCOPY WITH LITHOLAPAXY N/A 05/02/2022   Procedure: CYSTOSCOPY WITH LITHOLAPAXY;  Surgeon: Francisca Redell BROCKS, MD;  Location: [facility];  Service: Urology;  Laterality: N/A;   CYSTOSCOPY WITH URETHRAL DILATATION  12/04/2017   Procedure: CYSTOSCOPY WITH URETHRAL DILATATION;  Surgeon: Francisca Redell BROCKS, MD;  Location: [facility];  Service: Urology;;   CYSTOSCOPY WITH URETHRAL DILATATION N/A 05/02/2022   Procedure: CYSTOSCOPY  URETHRAL DILATATION;  Surgeon: Francisca Redell BROCKS, MD;  Location: [facility];  Service: Urology;  Laterality: N/A;   CYSTOSCOPY/URETEROSCOPY/HOLMIUM LASER/STENT PLACEMENT Left 12/04/2017   Procedure: CYSTOSCOPY/URETEROSCOPY/HOLMIUM LASER/STENT PLACEMENT;  Surgeon: Francisca Redell BROCKS, MD;  Location: [facility];  Service: Urology;  Laterality: Left;   CYSTOSCOPY/URETEROSCOPY/HOLMIUM LASER/STENT PLACEMENT Right 05/02/2022   Procedure: CYSTOSCOPY/URETEROSCOPY/HOLMIUM LASER/STENT PLACEMENT/RETROGRADE WITH PYLOGRAM;  Surgeon: Francisca,  Redell BROCKS, MD;  Location: [facility];  Service: Urology;  Laterality: Right;   CYSTOSCOPY/URETEROSCOPY/HOLMIUM LASER/STENT PLACEMENT Left 06/26/2023   Procedure: CYSTOSCOPY/URETEROSCOPY/HOLMIUM LASER/STENT PLACEMENT;  Surgeon: Francisca Redell BROCKS, MD;  Location: [facility];  Service: Urology;  Laterality: Left;   ESOPHAGOGASTRODUODENOSCOPY (EGD) WITH PROPOFOL  N/A 12/21/2018   normal esophagus, normal stomach, peptic duodenitis - 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     aspirin  EC 81 MG tablet Take 81 mg by mouth daily.     brinzolamide  (AZOPT ) 1 % ophthalmic suspension Place 1 drop into both eyes 2 (two) times daily.     budeson-glycopyrrolate -formoterol (BREZTRI  AEROSPHERE) 160-9-4.8 MCG/ACT AERO inhaler Inhale 2 puffs into the lungs in the morning and at bedtime. 10.7 g 5   calcium  carbonate (TUMS) 500 MG chewable tablet Chew 1 tablet (200 mg of elemental calcium  total) by mouth daily as needed for indigestion or heartburn. (Patient taking differently: Chew 1 tablet by mouth 4 (four) times daily as needed for indigestion or heartburn.)     Cholecalciferol (VITAMIN D3 MAXIMUM STRENGTH) 125 MCG (5000 UT) capsule Take 5,000 Units by mouth daily.     clotrimazole (MYCELEX) 10 MG troche Take 10 mg by mouth as needed.     Cyanocobalamin  1000 MCG TBCR Take 1 tablet by mouth daily.     dorzolamide (TRUSOPT) 2 % ophthalmic solution Place 1 drop into both eyes 2 (two) times daily.     Ensifentrine  (OHTUVAYRE ) 3 MG/2.5ML SUSP Inhale 2.5 mLs into the lungs 2 (two) times daily.     levalbuterol  (XOPENEX  HFA) 45 MCG/ACT inhaler Inhale 2 puffs into the lungs every 4 (four) hours as needed for wheezing. 15 g 2   losartan  (COZAAR ) 25 MG tablet Take 1 tablet (25 mg total) by mouth daily. 90 tablet 1   Multiple Vitamin (MULTI-VITAMINS) TABS Take 2 tablets by mouth daily.     ondansetron  (ZOFRAN ) 4 MG tablet TAKE ONE TABLET BY MOUTH EVERY 8 HOURS AS NEEDED FOR NAUSEA OR VOMITING 8 tablet 0   rosuvastatin  (CRESTOR ) 40 MG tablet TAKE 1 TABLET BY MOUTH ONCE DAILY 90 tablet 0   sotalol  (BETAPACE ) 80 MG tablet TAKE ONE TABLET BY MOUTH TWICE DAILY 180 tablet 0   tamsulosin  (FLOMAX ) 0.4 MG CAPS capsule TAKE 1 CAPSULE BY MOUTH EVERY DAY 90 capsule 3   TURMERIC-GINGER PO Take 2 capsules by mouth  daily.     vitamin E 180 MG (400 UNITS) capsule Take 400 Units by mouth daily.     sulfamethoxazole -trimethoprim  (BACTRIM  DS) 800-160 MG tablet Take 1 tablet by mouth 2 (two) times daily. (Patient not taking: Reported on 07/30/2023) 10 tablet 0   No current facility-administered medications for this visit.    Allergies:   Xarelto  [rivaroxaban ]    Social History:   reports that he quit smoking about 10 years ago. His smoking use included cigarettes. He started smoking about 63 years ago. He has a 106 pack-year smoking history. He has been exposed to tobacco smoke. He has never used smokeless tobacco. He reports that he does not drink alcohol and does not use drugs.   Family History:  family history includes COPD in his father and mother; Cancer in his father.    ROS:     Review of Systems  Constitutional:  Negative.   HENT: Negative.    Eyes: Negative.   Respiratory: Negative.    Gastrointestinal: Negative.   Genitourinary: Negative.   Musculoskeletal: Negative.   Skin: Negative.   Neurological: Negative.   Endo/Heme/Allergies: Negative.   Psychiatric/Behavioral: Negative.    All other systems reviewed and are negative.     All other systems are reviewed and negative.    PHYSICAL EXAM: VS:  BP 112/72   Pulse 83   Ht 5' 6 (1.676 m)   Wt 156 lb 6.4 oz (70.9 kg)   SpO2 96%   BMI 25.24 kg/m  , BMI Body mass index is 25.24 kg/m. Last weight:  Wt Readings from Last 3 Encounters:  07/30/23 156 lb 6.4 oz (70.9 kg)  07/23/23 152 lb 12.8 oz (69.3 kg)  07/16/23 147 lb 9.6 oz (67 kg)     Physical Exam Vitals reviewed.  Constitutional:      Appearance: Normal appearance. He is normal weight.  HENT:     Head: Normocephalic.     Nose: Nose normal.     Mouth/Throat:     Mouth: Mucous membranes are moist.   Eyes:     Pupils: Pupils are equal, round, and reactive to light.    Cardiovascular:     Rate and Rhythm: Normal rate and regular rhythm.     Pulses: Normal  pulses.     Heart sounds: Normal heart sounds.  Pulmonary:     Effort: Pulmonary effort is normal.  Abdominal:     General: Abdomen is flat. Bowel sounds are normal.   Musculoskeletal:        General: Normal range of motion.     Cervical back: Normal range of motion.   Skin:    General: Skin is warm.   Neurological:     General: No focal deficit present.     Mental Status: He is alert.   Psychiatric:        Mood and Affect: Mood normal.       EKG:   Recent Labs: 06/25/2023: BUN 36; Creatinine, Ser 3.57; Hemoglobin 13.3; Platelets 244; Potassium 3.9; Sodium 139    Lipid Panel    Component Value Date/Time   CHOL 202 (H) 11/16/2017 1538   TRIG 221.0 (H) 11/16/2017 1538   TRIG 75 04/28/2013 0000   HDL 40.90 11/16/2017 1538   CHOLHDL 5 11/16/2017 1538   VLDL 44.2 (H) 11/16/2017 1538   LDLCALC 154 (H) 08/31/2015 0900   LDLDIRECT 152.0 11/16/2017 1538      Other studies Reviewed: Additional studies/ records that were reviewed today include:  Review of the above records demonstrates:       No data to display            ASSESSMENT AND PLAN:    ICD-10-CM   1. Hypotension due to drugs  I95.2    Hypotension has resolved with discontinuing of amlodipine and changing losartan  to 25 mg from 100 mg.  Continue losartan  25 mg once a day.    2. Coronary artery disease involving native coronary artery of native heart without angina pectoris  I25.10     3. Chronic atrial fibrillation (HCC)  I48.20     4. Mixed hyperlipidemia  E78.2     5. Other chest pain  R07.89    Denies any chest pain with echocardiogram showing normal left ventricular systolic function.  With moderate mitral regurgitation and pulmonary hypertension    6. Lone atrial fibrillation with RVR  I48.91  7. Nonrheumatic mitral valve regurgitation  I34.0    Moderate MR       Problem List Items Addressed This Visit       Cardiovascular and Mediastinum   Lone atrial fibrillation with RVR    CAD (coronary artery disease)   Chronic atrial fibrillation (HCC)     Other   Hyperlipidemia   Other Visit Diagnoses       Hypotension due to drugs    -  Primary   Hypotension has resolved with discontinuing of amlodipine and changing losartan  to 25 mg from 100 mg.  Continue losartan  25 mg once a day.     Other chest pain       Denies any chest pain with echocardiogram showing normal left ventricular systolic function.  With moderate mitral regurgitation and pulmonary hypertension     Nonrheumatic mitral valve regurgitation       Moderate MR          Disposition:   Return in about 3 months (around 10/30/2023).    Total time spent: 40 minutes  Signed,  Denyse Bathe, MD  07/30/2023 10:32 AM    [facility]

## 2023-07-31 ENCOUNTER — Ambulatory Visit: Payer: Self-pay | Admitting: Pulmonary Disease

## 2023-07-31 LAB — ALLERGEN PANEL (27) + IGE
Alternaria Alternata IgE: <0.1 kU/L
Aspergillus Fumigatus IgE: <0.1 kU/L
Bahia Grass IgE: <0.1 kU/L
Bermuda Grass IgE: <0.1 kU/L
Cat Dander IgE: <0.1 kU/L
Cedar, Mountain IgE: <0.1 kU/L
Cladosporium Herbarum IgE: <0.1 kU/L
Cocklebur IgE: <0.1 kU/L
Cockroach, American IgE: <0.1 kU/L
Common Silver Birch IgE: <0.1 kU/L
D Farinae IgE: <0.1 kU/L
D Pteronyssinus IgE: <0.1 kU/L
Dog Dander IgE: <0.1 kU/L
Elm, American IgE: <0.1 kU/L
Hickory, White IgE: <0.1 kU/L
IgE (Immunoglobulin E), Serum: 27 [IU]/mL (ref 6–495)
Johnson Grass IgE: <0.1 kU/L
Kentucky Bluegrass IgE: <0.1 kU/L
Maple/Box Elder IgE: <0.1 kU/L
Mucor Racemosus IgE: <0.1 kU/L
Oak, White IgE: <0.1 kU/L
Penicillium Chrysogen IgE: <0.1 kU/L
Pigweed, Rough IgE: <0.1 kU/L
Plantain, English IgE: <0.1 kU/L
Ragweed, Short IgE: <0.1 kU/L
Setomelanomma Rostrat: <0.1 kU/L
Timothy Grass IgE: <0.1 kU/L
White Mulberry IgE: <0.1 kU/L

## 2023-07-31 NOTE — Progress Notes (Signed)
 LVMTCB   E2C2 please relay result note if pt calls back.

## 2023-07-31 NOTE — Progress Notes (Signed)
 Spoke with patient and relayed results

## 2023-08-06 ENCOUNTER — Encounter: Admitting: Pulmonary Disease

## 2023-08-20 ENCOUNTER — Ambulatory Visit: Payer: Self-pay | Admitting: Urology

## 2023-08-25 ENCOUNTER — Other Ambulatory Visit: Payer: Self-pay | Admitting: Cardiovascular Disease

## 2023-09-03 ENCOUNTER — Encounter: Payer: Self-pay | Admitting: Urology

## 2023-09-08 NOTE — Telephone Encounter (Signed)
 Cobb, Comer GAILS, NP  Obike, Mercy, CMA2 months ago    06/06/2023 ONO on room air: only 6 sec </88%, average 92% No need for oxygen at night Thanks

## 2023-09-29 ENCOUNTER — Other Ambulatory Visit: Payer: Self-pay | Admitting: Cardiovascular Disease

## 2023-09-29 DIAGNOSIS — E782 Mixed hyperlipidemia: Secondary | ICD-10-CM

## 2023-10-12 ENCOUNTER — Other Ambulatory Visit: Payer: Self-pay | Admitting: Cardiovascular Disease

## 2023-10-12 DIAGNOSIS — I952 Hypotension due to drugs: Secondary | ICD-10-CM

## 2023-10-12 DIAGNOSIS — R0789 Other chest pain: Secondary | ICD-10-CM

## 2023-10-12 DIAGNOSIS — E782 Mixed hyperlipidemia: Secondary | ICD-10-CM

## 2023-10-12 DIAGNOSIS — I482 Chronic atrial fibrillation, unspecified: Secondary | ICD-10-CM

## 2023-10-12 DIAGNOSIS — I4891 Unspecified atrial fibrillation: Secondary | ICD-10-CM

## 2023-10-12 DIAGNOSIS — I2510 Atherosclerotic heart disease of native coronary artery without angina pectoris: Secondary | ICD-10-CM

## 2023-10-16 ENCOUNTER — Other Ambulatory Visit: Payer: Self-pay | Admitting: Pulmonary Disease

## 2023-10-22 ENCOUNTER — Encounter: Payer: Self-pay | Admitting: Pulmonary Disease

## 2023-10-22 ENCOUNTER — Telehealth: Payer: Self-pay

## 2023-10-22 ENCOUNTER — Ambulatory Visit: Admitting: Pulmonary Disease

## 2023-10-22 VITALS — BP 136/76 | HR 74 | Temp 97.7°F | Ht 66.0 in | Wt 160.6 lb

## 2023-10-22 DIAGNOSIS — J449 Chronic obstructive pulmonary disease, unspecified: Secondary | ICD-10-CM

## 2023-10-22 DIAGNOSIS — Z23 Encounter for immunization: Secondary | ICD-10-CM

## 2023-10-22 DIAGNOSIS — R0602 Shortness of breath: Secondary | ICD-10-CM | POA: Diagnosis not present

## 2023-10-22 DIAGNOSIS — J329 Chronic sinusitis, unspecified: Secondary | ICD-10-CM | POA: Diagnosis not present

## 2023-10-22 DIAGNOSIS — J4489 Other specified chronic obstructive pulmonary disease: Secondary | ICD-10-CM | POA: Diagnosis not present

## 2023-10-22 DIAGNOSIS — D721 Eosinophilia, unspecified: Secondary | ICD-10-CM

## 2023-10-22 LAB — NITRIC OXIDE: Nitric Oxide: 19

## 2023-10-22 NOTE — Patient Instructions (Signed)
 VISIT SUMMARY:  Victor Cortez, a 73 year old male with COPD, came in for a follow-up visit. He has been taking his medications as prescribed and recently renewed his rescue inhaler. He quit smoking ten years ago and had a lung collapse a couple of years ago. He is currently using a nebulizer and has been prescribed prednisone  earlier this year for a kidney-related issue. He is scheduled to see a cardiologist next Thursday and follows up with a urologist more frequently than his primary care physician. He reported feeling 'a little wheezy' today.  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 Your lung function is poor, and you experience wheezing, which indicates airway inflammation. Blood tests in May showed high eosinophil levels, suggesting eosinophilic inflammation. We recommend starting Dupixent bi-weekly for better results in similar cases. We will also check your eosinophil levels with a breathing test, administer an influenza vaccine, and coordinate with the pharmacy team for Dupixent administration training. Please schedule a follow-up in three months.  INSTRUCTIONS:  Please schedule a follow-up appointment in three months. You will also need to see the pharmacy team for training on how to administer Dupixent. Additionally, you are scheduled to see a cardiologist next Thursday.

## 2023-10-22 NOTE — Progress Notes (Signed)
 Subjective:    Patient ID: Victor Cortez, male    DOB: 1950-07-18, 73 y.o.   MRN: [id number]  Patient Care Team: Rilla Baller, MD as PCP - General (Family Medicine) Brenna Adine CROME, DO as Consulting Physician (Pulmonary Disease)  Chief Complaint  Patient presents with   COPD    Cough with occasional phlegm. Shortness of breath on exertion and wheezing.     BACKGROUND/INTERVAL:73 year old male, former very heavy smoker followed for emphysema and lung nodules. He is a former patient of Dr. Harriet and last seen in office 04/11/2021 by Dr.Icard.  Last seen here by Comer Rouleau, NP on 27 May 2023.  Past medical history significant for CAD, hx of a fib, GERD, hepatic steatosis, HLD. He has a history of ptx treated with tube thoracostomy by Dr. Army December 2021.  I initially saw the patient on 23 July 2023.  HPI Discussed the use of AI scribe software for clinical note transcription with the patient, who gave verbal consent to proceed.  History of Present Illness   Victor Cortez is a 73 year old male with COPD who presents for follow-up.  He is taking his medications as prescribed and has recently renewed his rescue inhaler, although he tries not to use it frequently. He is concerned about the impact of his lung function on his health. He quit smoking ten years ago and had a secondary spontaneous pneumothorax a couple of years ago with no sequela.  He is currently using a nebulizer and recalls having been prescribed prednisone  earlier this year for a kidney-related issue, possibly kidney stones. He is scheduled to see a cardiologist next Thursday and follows up with a urologist fairly frequently.  He feels 'a little wheezy' today.      DATA 12/11/2022 echo: nl EF, RV function. Mild TR. Mild PH. Mild mitral regurgitation.  04/13/2023 LDCT chest: atherosclerosis/CAD. Mild to moderate emphysema. Clustered nodularity in the LUL, likely due to prior infectious bronchiolitis.  Scattered tiny calcified nodules, up to 2.8 mm. Lung RADS 1. 06/25/2023 eosinophils: 300 cells/uL 07/23/2023 PFTs: FEV1 0.99 L or 38% predicted, FVC 2.14 L or 59% predicted, FEV1/FVC 46%, there is significant bronchodilator response.  There is significant hyperinflation and air trapping diffusion capacity is severely impaired.  This is consistent with severe obstructive airways disease on the basis of emphysema.  There is a reversible component.   Review of Systems A 10 point review of systems was performed and it is as noted above otherwise negative.   Patient Active Problem List   Diagnosis Date Noted   Right ureteral stone 04/18/2022   Acquired renal cyst of left kidney 04/18/2022   Hydronephrosis with urinary obstruction due to ureteral calculus 04/18/2022   Left lower lobe consolidation (HCC) 03/11/2019   Chronic atrial fibrillation (HCC)    Pneumothorax, left 03/07/2019   Cholelithiasis 03/01/2019   Hepatic steatosis 03/01/2019   Lymphangioma 03/01/2019   Duodenitis determined by biopsy 03/01/2019   Right hand pain 03/01/2019   Pre-op evaluation 11/16/2017   Abnormal CT scan, gallbladder 11/16/2017   Rash of face 11/16/2017   Left ureteral stone 11/05/2017   CAD (coronary artery disease) 09/29/2016   Medicare annual wellness visit, initial 08/31/2015   Advanced care planning/counseling discussion 08/31/2015   Right hydrocele 11/21/2014   Right anterior shoulder pain 02/23/2014   Renal insufficiency 02/23/2014   Ex-smoker 07/11/2013   COPD (chronic obstructive pulmonary disease) (HCC)    Glaucoma    Hyperlipidemia  GERD (gastroesophageal reflux disease)    History of pneumonia 06/03/2013   Lone atrial fibrillation with RVR 06/03/2013    Social History   Tobacco Use   Smoking status: Former    Current packs/day: 0.00    Average packs/day: 2.0 packs/day for 53.0 years (106.0 ttl pk-yrs)    Types: Cigarettes    Start date: 06/03/1960    Quit date: 06/03/2013    Years  since quitting: 10.3    Passive exposure: Past   Smokeless tobacco: Never  Substance Use Topics   Alcohol use: No    Allergies  Allergen Reactions   Xarelto  [Rivaroxaban ] Cough    Coughed up blood    Current Meds  Medication Sig   ascorbic acid (VITAMIN C) 500 MG tablet Take 1,000 mg by mouth daily.   aspirin  EC 81 MG tablet Take 81 mg by mouth daily.   brinzolamide  (AZOPT ) 1 % ophthalmic suspension Place 1 drop into both eyes 2 (two) times daily.   budeson-glycopyrrolate -formoterol (BREZTRI  AEROSPHERE) 160-9-4.8 MCG/ACT AERO inhaler Inhale 2 puffs into the lungs in the morning and at bedtime.   calcium  carbonate (TUMS) 500 MG chewable tablet Chew 1 tablet (200 mg of elemental calcium  total) by mouth daily as needed for indigestion or heartburn. (Patient taking differently: Chew 1 tablet by mouth 4 (four) times daily as needed for indigestion or heartburn.)   Cholecalciferol (VITAMIN D3 MAXIMUM STRENGTH) 125 MCG (5000 UT) capsule Take 5,000 Units by mouth daily.   clotrimazole (MYCELEX) 10 MG troche Take 10 mg by mouth as needed.   Cyanocobalamin  1000 MCG TBCR Take 1 tablet by mouth daily.   dorzolamide (TRUSOPT) 2 % ophthalmic solution Place 1 drop into both eyes 2 (two) times daily.   Ensifentrine  (OHTUVAYRE ) 3 MG/2.5ML SUSP Inhale 2.5 mLs into the lungs 2 (two) times daily.   levalbuterol  (XOPENEX  HFA) 45 MCG/ACT inhaler INHALE 2 PUFFS INTO THE LUNGS EVERY 4 HOURS AS NEEDED FOR WHEEZING   losartan  (COZAAR ) 25 MG tablet TAKE ONE TABLET (25 MG) BY MOUTH ONCE DAILY   Multiple Vitamin (MULTI-VITAMINS) TABS Take 2 tablets by mouth daily.   ondansetron  (ZOFRAN ) 4 MG tablet TAKE ONE TABLET BY MOUTH EVERY 8 HOURS AS NEEDED FOR NAUSEA OR VOMITING   rosuvastatin  (CRESTOR ) 40 MG tablet TAKE 1 TABLET BY MOUTH ONCE DAILY   sotalol  (BETAPACE ) 80 MG tablet TAKE ONE TABLET BY MOUTH TWICE DAILY   tamsulosin  (FLOMAX ) 0.4 MG CAPS capsule TAKE 1 CAPSULE BY MOUTH EVERY DAY   TURMERIC-GINGER PO Take 2  capsules by mouth daily.   vitamin E 180 MG (400 UNITS) capsule Take 400 Units by mouth daily.    Immunization History  Administered Date(s) Administered   Fluad Quad(high Dose 65+) 10/31/2021   Hepatitis B 04/07/2005, 05/08/2005, 07/02/2005   INFLUENZA, HIGH DOSE SEASONAL PF 11/16/2018, 10/26/2019, 10/22/2023   Influenza,inj,Quad PF,6+ Mos 11/07/2014   Influenza-Unspecified 12/04/2017   Moderna Covid-19 Fall Seasonal Vaccine 59yrs & older 11/20/2021   Moderna SARS-COV2 Booster Vaccination 02/17/2020   Moderna Sars-Covid-2 Vaccination 04/27/2019, 05/25/2019   Pneumococcal Conjugate-13 07/11/2013   Pneumococcal Polysaccharide-23 02/03/2010, 08/31/2015   Td 08/04/2007   Tdap 11/25/2012   Zoster Recombinant(Shingrix) 02/19/2018, 06/08/2018   Zoster, Live 05/06/2011        Objective:     BP 136/76   Pulse 74   Temp 97.7 F (36.5 C) (Temporal)   Ht 5' 6 (1.676 m)   Wt 160 lb 9.6 oz (72.8 kg)   SpO2 95%   BMI  25.92 kg/m   SpO2: 95 %  GENERAL: Well-developed, well-nourished gentleman, no acute distress. HEAD: Normocephalic, atraumatic.  EYES: Pupils equal, round, reactive to light.  No scleral icterus.  MOUTH: Poor dentition (upper dentures, partial lower, missing teeth).  Oral mucosa moist.  No thrush. NECK: Supple. No thyromegaly. Trachea midline. No JVD.  No adenopathy. PULMONARY: Good air entry bilaterally.  Coarse, otherwise, no adventitious sounds. CARDIOVASCULAR: S1 and S2. Regular rate and rhythm.  ABDOMEN: Benign. MUSCULOSKELETAL: No joint deformity, no clubbing, no edema.  NEUROLOGIC: No overt focal deficit, no gait disturbance, speech is fluent. SKIN: Intact,warm,dry. PSYCH: Mood and behavior normal.        Assessment & Plan:     ICD-10-CM   1. Stage 3 severe COPD by GOLD classification (HCC)  J44.9     2. Asthma-COPD overlap syndrome (HCC)  J44.89     3. Chronic rhinosinusitis  J32.9     4. Shortness of breath  R06.02 Nitric oxide     5.  Eosinophilia, unspecified type  D72.10     6. Need for influenza vaccination  Z23       Orders Placed This Encounter  Procedures   Flu vaccine HIGH DOSE PF(Fluzone Trivalent)   Nitric oxide    Discussion:    Chronic obstructive pulmonary disease (COPD) with eosinophilia COPD with eosinophilia, characterized by wheezing and airway inflammation. He quit smoking ten years ago. Lung function is poor, and he experiences wheezing, indicating airway inflammation. Blood tests in May showed high eosinophil levels, suggesting eosinophilic inflammation. Dupixent  is recommended due to better results in similar cases, despite requiring bi-weekly administration. Nucala was considered as an alternative but Dupixent  was preferred due to better outcomes. - Start Dupixent  bi-weekly for COPD with eosinophilia. - Check eosinophil levels with a breathing test. - Administer influenza vaccine. - Coordinate with pharmacy team for Dupixent  administration training. - Schedule follow-up in three months.     Advised if symptoms do not improve or worsen, to please contact office for sooner follow up or seek emergency care.    I spent 42 minutes of dedicated to the care of this patient on the date of this encounter to include pre-visit review of records, face-to-face time with the patient discussing conditions above, post visit ordering of testing, clinical documentation with the electronic health record, making appropriate referrals as documented, and communicating necessary findings to members of the patients care team.     C. Leita Sanders, MD Advanced Bronchoscopy PCCM [facility]    *This note was generated using voice recognition software/Dragon and/or AI transcription program.  Despite best efforts to proofread, errors can occur which can change the meaning. Any transcriptional errors that result from this process are unintentional and may not be fully corrected at the time of  dictation.

## 2023-10-22 NOTE — Telephone Encounter (Signed)
 Patient seen in office today by Dr. Tamea, pt will start Dupixent. Patient signed form. Forms will be faxed to pharmacy team.

## 2023-10-23 ENCOUNTER — Other Ambulatory Visit (HOSPITAL_COMMUNITY): Payer: Self-pay

## 2023-10-23 ENCOUNTER — Telehealth: Payer: Self-pay

## 2023-10-23 DIAGNOSIS — J4489 Other specified chronic obstructive pulmonary disease: Secondary | ICD-10-CM

## 2023-10-23 NOTE — Telephone Encounter (Signed)
 Received new start paperwork for Dupixent. Submitted a Prior Authorization request to [employer] for DUPIXENT via CoverMyMeds. Will update once we receive a response.  Key: [id number]

## 2023-10-23 NOTE — Telephone Encounter (Signed)
 Received notification from [facility] regarding a prior authorization for DUPIXENT. Authorization has been APPROVED from 10/23/23 to 04/21/24. Approval letter sent to scan center.  Per test claim, copay for 28 days supply is $18.46  Authorization # [id number] Phone # [phone]  Will contact pt to see if he is ok with copay

## 2023-10-26 ENCOUNTER — Other Ambulatory Visit (HOSPITAL_COMMUNITY): Payer: Self-pay

## 2023-10-26 MED ORDER — DUPIXENT 300 MG/2ML ~~LOC~~ SOAJ
SUBCUTANEOUS | 0 refills | Status: DC
  Filled 2023-10-27: qty 8, 28d supply, fill #0

## 2023-10-26 NOTE — Telephone Encounter (Signed)
 Advised the pt his copay is $18.46 this time and we are not sure if it will remain at this copay or be no charge for his refills. He stated he is fine with the copay. If it rises significantly we can look into applying for an Asthma grant.

## 2023-10-26 NOTE — Telephone Encounter (Signed)
 See telephone encounter from 9/19. The pharmacy has received the form.  Nothing further needed.

## 2023-10-26 NOTE — Telephone Encounter (Signed)
 Spoke with patient - scheduled for new start Dupixent  on 11/03/23 at [facility] location. He is aware this location differs from where he normally sees Dr. Tamea.   He is aware he will hear from [facility] for onboarding.   Aleck Puls, PharmD, BCPS Clinical Pharmacist  [facility]

## 2023-10-27 ENCOUNTER — Other Ambulatory Visit: Payer: Self-pay

## 2023-10-27 NOTE — Progress Notes (Signed)
 Specialty Pharmacy Initial Fill Coordination Note  Victor Cortez is a 73 y.o. male contacted today regarding initial fill of specialty medication(s) Dupilumab  (Dupixent )   Patient requested Courier to Provider Office   Delivery date: 10/29/23   Verified address: [address], [place], [place] [address]   Medication will be filled on 9/24.   Patient is aware of $18.46 copayment.

## 2023-10-28 ENCOUNTER — Other Ambulatory Visit: Payer: Self-pay

## 2023-10-29 ENCOUNTER — Encounter: Payer: Self-pay | Admitting: Cardiovascular Disease

## 2023-10-29 ENCOUNTER — Other Ambulatory Visit: Payer: Self-pay

## 2023-10-29 ENCOUNTER — Ambulatory Visit (INDEPENDENT_AMBULATORY_CARE_PROVIDER_SITE_OTHER): Admitting: Cardiovascular Disease

## 2023-10-29 VITALS — BP 118/64 | HR 79 | Ht 66.0 in | Wt 161.0 lb

## 2023-10-29 DIAGNOSIS — I2510 Atherosclerotic heart disease of native coronary artery without angina pectoris: Secondary | ICD-10-CM

## 2023-10-29 DIAGNOSIS — Z0130 Encounter for examination of blood pressure without abnormal findings: Secondary | ICD-10-CM

## 2023-10-29 DIAGNOSIS — I482 Chronic atrial fibrillation, unspecified: Secondary | ICD-10-CM

## 2023-10-29 DIAGNOSIS — I272 Pulmonary hypertension, unspecified: Secondary | ICD-10-CM | POA: Diagnosis not present

## 2023-10-29 DIAGNOSIS — E782 Mixed hyperlipidemia: Secondary | ICD-10-CM

## 2023-10-29 DIAGNOSIS — I480 Paroxysmal atrial fibrillation: Secondary | ICD-10-CM

## 2023-10-29 DIAGNOSIS — I340 Nonrheumatic mitral (valve) insufficiency: Secondary | ICD-10-CM

## 2023-10-29 NOTE — Progress Notes (Signed)
 Cardiology Office Note   Date:  10/29/2023   ID:  Victor Cortez, DOB [birth year], MRN [id number]  PCP:  Victor Baller, MD  Cardiologist:  Victor Bathe, MD      History of Present Illness: Victor Cortez is a 73 y.o. male who presents for  Chief Complaint  Patient presents with   Follow-up    3 month follow up    Has eczema and will be dupixit.      Past Medical History:  Diagnosis Date   Aortic atherosclerosis    Arthritis    Atrial fibrillation with RVR (HCC) 06/2013   a.) CHA2DS2VASc = 3 (age, HTN, vascular disease history); b.) rate/rhythm maintained on oral sotalol ; no chronic anticoagulation   Basal cell carcinoma of nose 1998   Bilateral inguinal hernia    Cholelithiasis 10/2017   COPD (chronic obstructive pulmonary disease) (HCC)    Coronary artery calcification seen on CT scan    Diastolic dysfunction    a.) TTE 07/29/2021: EF >55%, mild LVH, mild-mod MR, G1DD   Dyspnea    ED (erectile dysfunction)    a.) on PDE5i (sildenafil ) PRN   Ex-smoker    GERD (gastroesophageal reflux disease)    Glaucoma    Hepatic steatosis    History of bilateral cataract extraction 07/2020   History of chicken pox    History of kidney stones    History of pneumonia 06/2013   with sepsis and [facility] hospitalization   Hyperlipidemia    Hypertension    Nephrolithiasis    Pneumonia    Right hydrocele    Rosacea    Seborrheic dermatitis    Umbilical hernia    Urethral stricture    Wears dentures    full upper, partial lower     Past Surgical History:  Procedure Laterality Date   CATARACT EXTRACTION W/PHACO Left 07/10/2020   Procedure: CATARACT EXTRACTION PHACO AND INTRAOCULAR LENS PLACEMENT (IOC) LEFT VIVITY TORIC 9.00 00:51.4;  Surgeon: Jaye Fallow, MD;  Location: [facility];  Service: Ophthalmology;  Laterality: Left;   CATARACT EXTRACTION W/PHACO Right 07/24/2020   Procedure: CATARACT EXTRACTION PHACO AND INTRAOCULAR LENS PLACEMENT (IOC) RIGHT;   Surgeon: Jaye Fallow, MD;  Location: [facility];  Service: Ophthalmology;  Laterality: Right;  6.65 0:41.0   COLONOSCOPY     CYSTOSCOPY WITH LITHOLAPAXY N/A 05/02/2022   Procedure: CYSTOSCOPY WITH LITHOLAPAXY;  Surgeon: Francisca Redell BROCKS, MD;  Location: [facility];  Service: Urology;  Laterality: N/A;   CYSTOSCOPY WITH URETHRAL DILATATION  12/04/2017   Procedure: CYSTOSCOPY WITH URETHRAL DILATATION;  Surgeon: Francisca Redell BROCKS, MD;  Location: [facility];  Service: Urology;;   CYSTOSCOPY WITH URETHRAL DILATATION N/A 05/02/2022   Procedure: CYSTOSCOPY  URETHRAL DILATATION;  Surgeon: Francisca Redell BROCKS, MD;  Location: [facility];  Service: Urology;  Laterality: N/A;   CYSTOSCOPY/URETEROSCOPY/HOLMIUM LASER/STENT PLACEMENT Left 12/04/2017   Procedure: CYSTOSCOPY/URETEROSCOPY/HOLMIUM LASER/STENT PLACEMENT;  Surgeon: Francisca Redell BROCKS, MD;  Location: [facility];  Service: Urology;  Laterality: Left;   CYSTOSCOPY/URETEROSCOPY/HOLMIUM LASER/STENT PLACEMENT Right 05/02/2022   Procedure: CYSTOSCOPY/URETEROSCOPY/HOLMIUM LASER/STENT PLACEMENT/RETROGRADE WITH EBONHMJF;  Surgeon: Francisca Redell BROCKS, MD;  Location: [facility];  Service: Urology;  Laterality: Right;   CYSTOSCOPY/URETEROSCOPY/HOLMIUM LASER/STENT PLACEMENT Left 06/26/2023   Procedure: CYSTOSCOPY/URETEROSCOPY/HOLMIUM LASER/STENT PLACEMENT;  Surgeon: Francisca Redell BROCKS, MD;  Location: [facility];  Service: Urology;  Laterality: Left;   ESOPHAGOGASTRODUODENOSCOPY (EGD) WITH PROPOFOL  N/A 12/21/2018   normal esophagus, normal stomach, peptic duodenitis - 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     aspirin  EC 81 MG tablet Take 81 mg by mouth daily.     brinzolamide  (AZOPT ) 1 % ophthalmic suspension Place 1 drop into both eyes 2 (two) times daily.      budeson-glycopyrrolate -formoterol (BREZTRI  AEROSPHERE) 160-9-4.8 MCG/ACT AERO inhaler Inhale 2 puffs into the lungs in the morning and at bedtime. 10.7 g 5   calcium  carbonate (TUMS) 500 MG chewable tablet Chew 1 tablet (200 mg of elemental calcium  total) by mouth daily as needed for indigestion or heartburn. (Patient taking differently: Chew 1 tablet by mouth 4 (four) times daily as needed for indigestion or heartburn.)     Cholecalciferol (VITAMIN D3 MAXIMUM STRENGTH) 125 MCG (5000 UT) capsule Take 5,000 Units by mouth daily.     clotrimazole (MYCELEX) 10 MG troche Take 10 mg by mouth as needed.     Cyanocobalamin  1000 MCG TBCR Take 1 tablet by mouth daily.     dorzolamide (TRUSOPT) 2 % ophthalmic solution Place 1 drop into both eyes 2 (two) times daily.     Dupilumab  (DUPIXENT ) 300 MG/2ML SOAJ Inject contents of two pens (600mg ) in the skin on day 0 in clinic. Then inject contents of one pen (300mg ) in the skin on day 14 and every 14 days thereafter. Courier to pulm: [address], [place] [address]. Appt on 11/03/23. 8 mL 0   Ensifentrine  (OHTUVAYRE ) 3 MG/2.5ML SUSP Inhale 2.5 mLs into the lungs 2 (two) times daily.     levalbuterol  (XOPENEX  HFA) 45 MCG/ACT inhaler INHALE 2 PUFFS INTO THE LUNGS EVERY 4 HOURS AS NEEDED FOR WHEEZING 15 g 2   losartan  (COZAAR ) 25 MG tablet TAKE ONE TABLET (25 MG) BY MOUTH ONCE DAILY 90 tablet 1   Multiple Vitamin (MULTI-VITAMINS) TABS Take 2 tablets by mouth daily.     ondansetron  (ZOFRAN ) 4 MG tablet TAKE ONE TABLET BY MOUTH EVERY 8 HOURS AS NEEDED FOR NAUSEA OR VOMITING 8 tablet 0   rosuvastatin  (CRESTOR ) 40 MG tablet TAKE 1 TABLET BY MOUTH ONCE DAILY 90 tablet 0   sotalol  (BETAPACE ) 80 MG tablet TAKE ONE TABLET BY MOUTH TWICE DAILY 180 tablet 0   tamsulosin  (FLOMAX ) 0.4 MG CAPS capsule TAKE 1 CAPSULE BY MOUTH EVERY DAY 90 capsule 3   TURMERIC-GINGER PO Take 2 capsules by mouth daily.     vitamin E 180 MG (400 UNITS) capsule Take 400 Units by mouth  daily.     No current facility-administered medications for this visit.    Allergies:   Xarelto  [rivaroxaban ]    Social History:   reports that he quit smoking about 10 years ago. His smoking use included cigarettes. He started smoking about 63 years ago. He has a 106 pack-year smoking history. He has been exposed to tobacco smoke. He has never used smokeless tobacco. He reports that he does not drink alcohol and does not use drugs.   Family History:  family history includes COPD in his father and mother; Cancer in his father.    ROS:     Review of Systems  Constitutional: Negative.   HENT: Negative.    Eyes: Negative.   Respiratory: Negative.    Gastrointestinal: Negative.   Genitourinary: Negative.   Musculoskeletal: Negative.   Skin: Negative.   Neurological: Negative.  Endo/Heme/Allergies: Negative.   Psychiatric/Behavioral: Negative.    All other systems reviewed and are negative.     All other systems are reviewed and negative.    PHYSICAL EXAM: VS:  BP 118/64   Pulse 79   Ht 5' 6 (1.676 m)   Wt 161 lb (73 kg)   SpO2 93%   BMI 25.99 kg/m  , BMI Body mass index is 25.99 kg/m. Last weight:  Wt Readings from Last 3 Encounters:  10/29/23 161 lb (73 kg)  10/22/23 160 lb 9.6 oz (72.8 kg)  07/30/23 156 lb 6.4 oz (70.9 kg)     Physical Exam Vitals reviewed.  Constitutional:      Appearance: Normal appearance. He is normal weight.  HENT:     Head: Normocephalic.     Nose: Nose normal.     Mouth/Throat:     Mouth: Mucous membranes are moist.  Eyes:     Pupils: Pupils are equal, round, and reactive to light.  Cardiovascular:     Rate and Rhythm: Normal rate and regular rhythm.     Pulses: Normal pulses.     Heart sounds: Normal heart sounds.  Pulmonary:     Effort: Pulmonary effort is normal.  Abdominal:     General: Abdomen is flat. Bowel sounds are normal.  Musculoskeletal:        General: Normal range of motion.     Cervical back: Normal range of  motion.  Skin:    General: Skin is warm.  Neurological:     General: No focal deficit present.     Mental Status: He is alert.  Psychiatric:        Mood and Affect: Mood normal.       EKG:   Recent Labs: 06/25/2023: BUN 36; Creatinine, Ser 3.57; Hemoglobin 13.3; Platelets 244; Potassium 3.9; Sodium 139    Lipid Panel    Component Value Date/Time   CHOL 202 (H) 11/16/2017 1538   TRIG 221.0 (H) 11/16/2017 1538   TRIG 75 04/28/2013 0000   HDL 40.90 11/16/2017 1538   CHOLHDL 5 11/16/2017 1538   VLDL 44.2 (H) 11/16/2017 1538   LDLCALC 154 (H) 08/31/2015 0900   LDLDIRECT 152.0 11/16/2017 1538      Other studies Reviewed: Additional studies/ records that were reviewed today include:  Review of the above records demonstrates:       No data to display            ASSESSMENT AND PLAN:    ICD-10-CM   1. Coronary artery disease involving native coronary artery of native heart without angina pectoris  I25.10 Basic metabolic panel with GFR    2. Chronic atrial fibrillation (HCC)  I48.20 Basic metabolic panel with GFR    3. Mixed hyperlipidemia  E78.2 Basic metabolic panel with GFR    4. Pulmonary hypertension (HCC)  I27.20 Basic metabolic panel with GFR   moderate    5. Nonrheumatic mitral valve regurgitation  I34.0 Basic metabolic panel with GFR   moderate MR, normal EF.    6. Paroxysmal atrial fibrillation (HCC)  I48.0 Basic metabolic panel with GFR   Creatinine  jumped to over 3 due to kidney stone. Recheck bun, creat and if high, need to change 80 daily sotolol.  May need amiodrine instead if creat high..       Problem List Items Addressed This Visit       Cardiovascular and Mediastinum   CAD (coronary artery disease) - Primary   Relevant Orders  Basic metabolic panel with GFR   Chronic atrial fibrillation (HCC)   Relevant Orders   Basic metabolic panel with GFR     Other   Hyperlipidemia   Relevant Orders   Basic metabolic panel with GFR   Other  Visit Diagnoses       Pulmonary hypertension (HCC)       moderate   Relevant Orders   Basic metabolic panel with GFR     Nonrheumatic mitral valve regurgitation       moderate MR, normal EF.   Relevant Orders   Basic metabolic panel with GFR     Paroxysmal atrial fibrillation (HCC)       Creatinine  jumped to over 3 due to kidney stone. Recheck bun, creat and if high, need to change 80 daily sotolol.  May need amiodrine instead if creat high..   Relevant Orders   Basic metabolic panel with GFR          Disposition:   Return in about 1 week (around 11/05/2023).    Total time spent: 30 minutes  Signed,  Victor Bathe, MD  10/29/2023 2:44 PM    [facility]

## 2023-10-30 ENCOUNTER — Other Ambulatory Visit: Payer: Self-pay

## 2023-10-30 LAB — BASIC METABOLIC PANEL WITH GFR
BUN/Creatinine Ratio: 11 (ref 10–24)
BUN: 13 mg/dL (ref 8–27)
CO2: 24 mmol/L (ref 20–29)
Calcium: 9.1 mg/dL (ref 8.6–10.2)
Chloride: 107 mmol/L — ABNORMAL HIGH (ref 96–106)
Creatinine, Ser: 1.2 mg/dL (ref 0.76–1.27)
Glucose: 102 mg/dL — ABNORMAL HIGH (ref 70–99)
Potassium: 4 mmol/L (ref 3.5–5.2)
Sodium: 145 mmol/L — ABNORMAL HIGH (ref 134–144)
eGFR: 63 mL/min/1.73 (ref >59)

## 2023-11-02 ENCOUNTER — Other Ambulatory Visit: Payer: Self-pay

## 2023-11-02 NOTE — Telephone Encounter (Signed)
 Error

## 2023-11-03 ENCOUNTER — Other Ambulatory Visit (HOSPITAL_COMMUNITY): Payer: Self-pay

## 2023-11-03 ENCOUNTER — Ambulatory Visit

## 2023-11-03 ENCOUNTER — Other Ambulatory Visit: Payer: Self-pay

## 2023-11-03 DIAGNOSIS — J4489 Other specified chronic obstructive pulmonary disease: Secondary | ICD-10-CM | POA: Diagnosis not present

## 2023-11-03 DIAGNOSIS — Z7189 Other specified counseling: Secondary | ICD-10-CM | POA: Diagnosis not present

## 2023-11-03 MED ORDER — DUPIXENT 300 MG/2ML ~~LOC~~ SOAJ
300.0000 mg | SUBCUTANEOUS | 3 refills | Status: DC
  Filled 2023-11-03 – 2023-12-14 (×4): qty 4, 28d supply, fill #0
  Filled 2024-01-04: qty 4, 28d supply, fill #1
  Filled 2024-02-01: qty 4, 28d supply, fill #2
  Filled 2024-02-26 – 2024-03-02 (×7): qty 4, 28d supply, fill #3

## 2023-11-03 NOTE — Progress Notes (Signed)
 HPI Patient presents today to [facility] to see pharmacy team for Dupixent  new start.  Past medical history includes CAD, history of atrial fibrillation, GERD, hepatic steatosis, HLD. He has asthma-COPD overlap. Last seen by Dr. Tamea on 10/22/23.  Respiratory Medications Current regimen:   - Maintenance: Breztri  160-9-4.8 mcg/act (Inhale 2 puffs into the lungs in the morning and at bedtime), Ohtuvayre  3mg /2.65mL susp (Inhale 2.5 mLs into the lungs 2 (two) times daily)  - Rescue: Xopenex  45 mcg/act inh (Inhale 2 puffs into the lungs every 4 hours as needed for wheezing)   Patient reports no known adherence challenges  OBJECTIVE Allergies  Allergen Reactions   Xarelto  [Rivaroxaban ] Cough    Coughed up blood    Outpatient Encounter Medications as of 11/03/2023  Medication Sig   ascorbic acid (VITAMIN C) 500 MG tablet Take 1,000 mg by mouth daily.   aspirin  EC 81 MG tablet Take 81 mg by mouth daily.   brinzolamide  (AZOPT ) 1 % ophthalmic suspension Place 1 drop into both eyes 2 (two) times daily.   budeson-glycopyrrolate -formoterol (BREZTRI  AEROSPHERE) 160-9-4.8 MCG/ACT AERO inhaler Inhale 2 puffs into the lungs in the morning and at bedtime.   calcium  carbonate (TUMS) 500 MG chewable tablet Chew 1 tablet (200 mg of elemental calcium  total) by mouth daily as needed for indigestion or heartburn. (Patient taking differently: Chew 1 tablet by mouth 4 (four) times daily as needed for indigestion or heartburn.)   Cholecalciferol (VITAMIN D3 MAXIMUM STRENGTH) 125 MCG (5000 UT) capsule Take 5,000 Units by mouth daily.   clotrimazole (MYCELEX) 10 MG troche Take 10 mg by mouth as needed.   Cyanocobalamin  1000 MCG TBCR Take 1 tablet by mouth daily.   dorzolamide (TRUSOPT) 2 % ophthalmic solution Place 1 drop into both eyes 2 (two) times daily.   Dupilumab  (DUPIXENT ) 300 MG/2ML SOAJ Inject contents of two pens (600mg ) in the skin on day 0 in clinic. Then inject contents of one pen (300mg ) in  the skin on day 14 and every 14 days thereafter. Courier to pulm: [address], [place] [address]. Appt on 11/03/23.   Ensifentrine  (OHTUVAYRE ) 3 MG/2.5ML SUSP Inhale 2.5 mLs into the lungs 2 (two) times daily.   levalbuterol  (XOPENEX  HFA) 45 MCG/ACT inhaler INHALE 2 PUFFS INTO THE LUNGS EVERY 4 HOURS AS NEEDED FOR WHEEZING   losartan  (COZAAR ) 25 MG tablet TAKE ONE TABLET (25 MG) BY MOUTH ONCE DAILY   Multiple Vitamin (MULTI-VITAMINS) TABS Take 2 tablets by mouth daily.   ondansetron  (ZOFRAN ) 4 MG tablet TAKE ONE TABLET BY MOUTH EVERY 8 HOURS AS NEEDED FOR NAUSEA OR VOMITING   rosuvastatin  (CRESTOR ) 40 MG tablet TAKE 1 TABLET BY MOUTH ONCE DAILY   sotalol  (BETAPACE ) 80 MG tablet TAKE ONE TABLET BY MOUTH TWICE DAILY   tamsulosin  (FLOMAX ) 0.4 MG CAPS capsule TAKE 1 CAPSULE BY MOUTH EVERY DAY   TURMERIC-GINGER PO Take 2 capsules by mouth daily.   vitamin E 180 MG (400 UNITS) capsule Take 400 Units by mouth daily.   No facility-administered encounter medications on file as of 11/03/2023.     Immunization History  Administered Date(s) Administered   Fluad Quad(high Dose 65+) 10/31/2021   Hepatitis B 04/07/2005, 05/08/2005, 07/02/2005   INFLUENZA, HIGH DOSE SEASONAL PF 11/16/2018, 10/26/2019, 10/22/2023   Influenza,inj,Quad PF,6+ Mos 11/07/2014   Influenza-Unspecified 12/04/2017   Moderna Covid-19 Fall Seasonal Vaccine 46yrs & older 11/20/2021   Moderna SARS-COV2 Booster Vaccination 02/17/2020   Moderna Sars-Covid-2 Vaccination 04/27/2019, 05/25/2019  Pneumococcal Conjugate-13 07/11/2013   Pneumococcal Polysaccharide-23 02/03/2010, 08/31/2015   Td 08/04/2007   Tdap 11/25/2012   Zoster Recombinant(Shingrix) 02/19/2018, 06/08/2018   Zoster, Live 05/06/2011     PFTs    Latest Ref Rng & Units 07/23/2023    2:34 PM  PFT Results  FVC-Pre L 2.14   FVC-Predicted Pre % 59   FVC-Post L 2.56   FVC-Predicted Post % 71   Pre FEV1/FVC % % 46   Post FEV1/FCV % % 46   FEV1-Pre L  0.99   FEV1-Predicted Pre % 38   FEV1-Post L 1.17   DLCO uncorrected ml/min/mmHg 10.86   DLCO UNC% % 49   DLVA Predicted % 61   TLC L 9.37   TLC % Predicted % 150   RV % Predicted % 296      Eosinophils Most recent blood eosinophil count was 300 cells/microL taken on 11/16/2017.   IgE: 27 on 07/29/23    Assessment   Biologics training for dupilumab  (Dupixent )  Goals of therapy: Mechanism: human monoclonal IgG4 antibody that inhibits interleukin-4 and interleukin-13 cytokine-induced responses, including release of proinflammatory cytokines, chemokines, and IgE Reviewed that Dupixent  is add-on medication and patient must continue maintenance inhaler regimen. Response to therapy: may take 4 months to determine efficacy. Discussed that patients generally feel improvement sooner than 4 months.  Side effects: injection site reaction (6-18%), antibody development (5-16%), ophthalmic conjunctivitis (2-16%), transient blood eosinophilia (1-2%)  Dose: 600mg  at Week 0 (administered today in clinic) followed by 300mg  every 14 days thereafter  Administration/Storage:  Reviewed administration sites of thigh or abdomen (at least 2-3 inches away from abdomen). Reviewed the upper arm is only appropriate if caregiver is administering injection  Do not shake pen/syringe as this could lead to product foaming or precipitation. Do not use if solution is discolored or contains particulate matter or if window on prefilled pen is yellow (indicates pen has been used).  Reviewed storage of medication in refrigerator. Reviewed that Dupixent  can be stored at room temperature in unopened carton for up to 14 days.  Access: Approval of Dupixent  through: insurance  Patient self-administered Dupixent  300mg /64ml x 2 (total dose 600mg ) in right upper thigh and left upper thigh using [facility]-supplied medication   Dupixent  300mg /14mL autoinjector pen NDC: 0024-5915-02 Lot: [id number] Expiration: 2025-12-03  Patient  monitored for 30 minutes for adverse reaction.  Patient tolerated well. Patient denies itchiness and irritation at injection.  Medication Reconciliation  A drug regimen assessment was performed, including review of allergies, interactions, disease-state management, dosing and immunization history. Medications were reviewed with the patient, including name, instructions, indication, goals of therapy, potential side effects, importance of adherence, and safe use.  Drug interaction(s): none noted  PLAN Continue Dupixent  300mg  every 14 days.  Next dose is due 11/17/23 and every 14 days thereafter. Rx sent to: [facility]: [phone] .  Patient provided with pharmacy phone number. Continue maintenance regimen of: Breztri  160-9-4.8 mcg/act (Inhale 2 puffs into the lungs in the morning and at bedtime), Ohtuvayre  3mg /2.35mL susp (Inhale 2.5 mLs into the lungs 2 (two) times daily)  Follow-up with Dr. Tamea on 01/21/24 as scheduled. Pharmacy team contact information provided at patient request.  All questions encouraged and answered.  Instructed patient to reach out with any further questions or concerns.  Thank you for allowing pharmacy to participate in this patient's care.  This appointment required 45 minutes of patient care (this includes precharting, chart review, review of results, face-to-face care, etc.).

## 2023-11-03 NOTE — Patient Instructions (Signed)
 Your next Dupixent  dose is due on 11/17/23, 12/01/23, and every 14 days thereafter.   Keep Dupixent  in the refrigerator. Remove from the refrigerator at least 30-45 minutes prior to use. If necessary, Dupixent  may be stored at room temperature for no longer than 14 days.   Dupixent  does NOT replace your other medications.  CONTINUE Breztri  160-9-4.8 mcg/act (Inhale 2 puffs into the lungs in the morning and at bedtime) and  Ohtuvayre  3mg /2.56mL susp (Inhale 2.5 mLs into the lungs 2 (two) times daily)   Your prescription will be shipped from [facility]. Their phone number is [phone]. Someone will call to schedule shipment and confirm address. They will mail your medication to your home.  You will need to be seen by your provider in 3 to 4 months to assess how Dupixent  is working for you. Please keep your follow-up appointment with Dr. Tamea on 01/21/24. Call our clinic if you need to make this appointment.  Stay up to date on all routine vaccines: influenza, pneumonia, COVID19, Shingles  How to manage an injection site reaction: Remember the 5 C's: COUNTER - leave on the counter at least 30 minutes but up to overnight to bring medication to room temperature. This may help prevent stinging COLD - place something cold (like an ice gel pack or cold water bottle) on the injection site just before cleansing with alcohol. This may help reduce pain CLARITIN - use Claritin (generic name is loratadine) for the first two weeks of treatment or the day of, the day before, and the day after injecting. This will help to minimize injection site reactions CORTISONE CREAM - apply if injection site is irritated and itching CALL ME - if injection site reaction is bigger than the size of your fist, looks infected, blisters, or if you develop hives

## 2023-11-03 NOTE — Progress Notes (Signed)
 Mr. Mcgue started Dupixent  in clinic on 11/03/23. Able to self-administer one-time loading dose Dupixent  600mg . Tolerated well.   PLAN Continue Dupixent  300mg  every 14 days.  Next dose is due 11/17/23 and every 14 days thereafter. Rx sent to: [facility]: [phone] .  Patient provided with pharmacy phone number. Continue maintenance regimen of: Breztri  160-9-4.8 mcg/act (Inhale 2 puffs into the lungs in the morning and at bedtime), Ohtuvayre  3mg /2.42mL susp (Inhale 2.5 mLs into the lungs 2 (two) times daily)  Follow-up with Dr. Tamea on 01/21/24 as scheduled. Pharmacy team contact information provided at patient request.   All questions encouraged and answered.  Instructed patient to reach out with any further questions or concerns.  Aleck Puls, PharmD, BCPS Clinical Pharmacist  [facility]

## 2023-11-06 ENCOUNTER — Encounter: Payer: Self-pay | Admitting: Cardiovascular Disease

## 2023-11-06 ENCOUNTER — Ambulatory Visit (INDEPENDENT_AMBULATORY_CARE_PROVIDER_SITE_OTHER): Admitting: Cardiovascular Disease

## 2023-11-06 VITALS — BP 120/62 | HR 85 | Ht 66.0 in | Wt 159.6 lb

## 2023-11-06 DIAGNOSIS — I482 Chronic atrial fibrillation, unspecified: Secondary | ICD-10-CM

## 2023-11-06 DIAGNOSIS — R0602 Shortness of breath: Secondary | ICD-10-CM | POA: Diagnosis not present

## 2023-11-06 DIAGNOSIS — E782 Mixed hyperlipidemia: Secondary | ICD-10-CM | POA: Diagnosis not present

## 2023-11-06 DIAGNOSIS — Z0130 Encounter for examination of blood pressure without abnormal findings: Secondary | ICD-10-CM

## 2023-11-06 DIAGNOSIS — I2510 Atherosclerotic heart disease of native coronary artery without angina pectoris: Secondary | ICD-10-CM

## 2023-11-06 DIAGNOSIS — I4891 Unspecified atrial fibrillation: Secondary | ICD-10-CM

## 2023-11-06 NOTE — Progress Notes (Signed)
 Cardiology Office Note   Date:  11/06/2023   ID:  WILLE AUBUCHON, DOB 12/16/1950, MRN [id number]  PCP:  Rilla Baller, MD  Cardiologist:  Denyse Bathe, MD      History of Present Illness: Victor Cortez is a 73 y.o. male who presents for  Chief Complaint  Patient presents with   Follow-up    1 week follow up    Has SOB on exertion.  Patient came for follow-up because patient was on sotalol  80 twice daily and heart rate last time was normal about 79.  But it was noticed that in May 2000 Jun 14, 2023 his creatinine went up to 3.7 last time his creatinine was normal with in 2021 but normally it was run up to 1.31.4.  Thus it was felt that sotalol  may not be the right drug because of worsening of renal failure.  Since renal function has to be normal to be on sotalol .  Sotalol  is cleared by the kidneys.  He says that he had kidney stone removed in May and that may be the reason why his creatinine was so high.  Thus repeat BUN/creatinine was done last week which showed creatinine being 1.2 and BUN around 11.  Patient feels short of breath but is not having any palpitation or fluttering or dizziness.  Last week we switched him to 80 mg once a day and his heart rate is 85.  Since creatinine is normal we will leave patient on 80 mg of sotalol  once a day and give extra dose of sotalol  if he has some palpitation associated with possible atrial fibrillation.      Past Medical History:  Diagnosis Date   Aortic atherosclerosis    Arthritis    Atrial fibrillation with RVR (HCC) 06/2013   a.) CHA2DS2VASc = 3 (age, HTN, vascular disease history); b.) rate/rhythm maintained on oral sotalol ; no chronic anticoagulation   Basal cell carcinoma of nose 1998   Bilateral inguinal hernia    Cholelithiasis 10/2017   COPD (chronic obstructive pulmonary disease) (HCC)    Coronary artery calcification seen on CT scan    Diastolic dysfunction    a.) TTE 07/29/2021: EF >55%, mild LVH, mild-mod MR, G1DD    Dyspnea    ED (erectile dysfunction)    a.) on PDE5i (sildenafil ) PRN   Ex-smoker    GERD (gastroesophageal reflux disease)    Glaucoma    Hepatic steatosis    History of bilateral cataract extraction 07/2020   History of chicken pox    History of kidney stones    History of pneumonia 06/2013   with sepsis and [facility] hospitalization   Hyperlipidemia    Hypertension    Nephrolithiasis    Pneumonia    Right hydrocele    Rosacea    Seborrheic dermatitis    Umbilical hernia    Urethral stricture    Wears dentures    full upper, partial lower     Past Surgical History:  Procedure Laterality Date   CATARACT EXTRACTION W/PHACO Left 07/10/2020   Procedure: CATARACT EXTRACTION PHACO AND INTRAOCULAR LENS PLACEMENT (IOC) LEFT VIVITY TORIC 9.00 00:51.4;  Surgeon: Jaye Fallow, MD;  Location: [facility];  Service: Ophthalmology;  Laterality: Left;   CATARACT EXTRACTION W/PHACO Right 07/24/2020   Procedure: CATARACT EXTRACTION PHACO AND INTRAOCULAR LENS PLACEMENT (IOC) RIGHT;  Surgeon: Jaye Fallow, MD;  Location: [facility];  Service: Ophthalmology;  Laterality: Right;  6.65 0:41.0   COLONOSCOPY  CYSTOSCOPY WITH LITHOLAPAXY N/A 05/02/2022   Procedure: CYSTOSCOPY WITH LITHOLAPAXY;  Surgeon: Francisca Redell BROCKS, MD;  Location: [facility];  Service: Urology;  Laterality: N/A;   CYSTOSCOPY WITH URETHRAL DILATATION  12/04/2017   Procedure: CYSTOSCOPY WITH URETHRAL DILATATION;  Surgeon: Francisca Redell BROCKS, MD;  Location: [facility];  Service: Urology;;   CYSTOSCOPY WITH URETHRAL DILATATION N/A 05/02/2022   Procedure: CYSTOSCOPY  URETHRAL DILATATION;  Surgeon: Francisca Redell BROCKS, MD;  Location: [facility];  Service: Urology;  Laterality: N/A;   CYSTOSCOPY/URETEROSCOPY/HOLMIUM LASER/STENT PLACEMENT Left 12/04/2017   Procedure: CYSTOSCOPY/URETEROSCOPY/HOLMIUM LASER/STENT PLACEMENT;  Surgeon: Francisca Redell BROCKS, MD;  Location: [facility];  Service: Urology;  Laterality: Left;    CYSTOSCOPY/URETEROSCOPY/HOLMIUM LASER/STENT PLACEMENT Right 05/02/2022   Procedure: CYSTOSCOPY/URETEROSCOPY/HOLMIUM LASER/STENT PLACEMENT/RETROGRADE WITH EBONHMJF;  Surgeon: Francisca Redell BROCKS, MD;  Location: [facility];  Service: Urology;  Laterality: Right;   CYSTOSCOPY/URETEROSCOPY/HOLMIUM LASER/STENT PLACEMENT Left 06/26/2023   Procedure: CYSTOSCOPY/URETEROSCOPY/HOLMIUM LASER/STENT PLACEMENT;  Surgeon: Francisca Redell BROCKS, MD;  Location: [facility];  Service: Urology;  Laterality: Left;   ESOPHAGOGASTRODUODENOSCOPY (EGD) WITH PROPOFOL  N/A 12/21/2018   normal esophagus, normal stomach, peptic duodenitis - 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     aspirin  EC 81 MG tablet Take 81 mg by mouth daily.     brinzolamide  (AZOPT ) 1 % ophthalmic suspension Place 1 drop into both eyes 2 (two) times daily.     budeson-glycopyrrolate -formoterol (BREZTRI  AEROSPHERE) 160-9-4.8 MCG/ACT AERO inhaler Inhale 2 puffs into the lungs in the morning and at bedtime. 10.7 g 5   calcium  carbonate (TUMS) 500 MG chewable tablet Chew 1 tablet (200 mg of elemental calcium  total) by mouth daily as needed for indigestion or heartburn.     Cholecalciferol (VITAMIN D3 MAXIMUM STRENGTH) 125 MCG (5000 UT) capsule Take 5,000 Units by mouth daily.     clotrimazole (MYCELEX) 10 MG troche Take 10 mg by mouth as needed.     Cyanocobalamin  1000 MCG TBCR Take 1 tablet by mouth daily.     dorzolamide (TRUSOPT) 2 % ophthalmic solution Place 1 drop into both eyes 2 (two) times daily.     Dupilumab  (DUPIXENT ) 300 MG/2ML SOAJ Inject 300 mg into the skin every 14 (fourteen) days. 4 mL 3   Ensifentrine  (OHTUVAYRE ) 3 MG/2.5ML SUSP Inhale 2.5 mLs into the lungs 2 (two) times daily.     levalbuterol  (XOPENEX  HFA) 45 MCG/ACT inhaler INHALE 2 PUFFS INTO THE LUNGS  EVERY 4 HOURS AS NEEDED FOR WHEEZING 15 g 2   losartan  (COZAAR ) 25 MG tablet TAKE ONE TABLET (25 MG) BY MOUTH ONCE DAILY 90 tablet 1   Multiple Vitamin (MULTI-VITAMINS) TABS Take 2 tablets by mouth daily.     rosuvastatin  (CRESTOR ) 40 MG tablet TAKE 1 TABLET BY MOUTH ONCE DAILY 90 tablet 0   sotalol  (BETAPACE ) 80 MG tablet TAKE ONE TABLET BY MOUTH TWICE DAILY 180 tablet 0   tamsulosin  (FLOMAX ) 0.4 MG CAPS capsule TAKE 1 CAPSULE BY MOUTH EVERY DAY 90 capsule 3   TURMERIC-GINGER PO Take 2 capsules by mouth daily.     vitamin E 180 MG (400 UNITS) capsule Take 400 Units by mouth daily.     No current facility-administered medications for this visit.    Allergies:   Xarelto  [rivaroxaban ]    Social History:   reports  that he quit smoking about 10 years ago. His smoking use included cigarettes. He started smoking about 63 years ago. He has a 106 pack-year smoking history. He has been exposed to tobacco smoke. He has never used smokeless tobacco. He reports that he does not drink alcohol and does not use drugs.   Family History:  family history includes COPD in his father and mother; Cancer in his father.    ROS:     Review of Systems  Constitutional: Negative.   HENT: Negative.    Eyes: Negative.   Respiratory: Negative.    Gastrointestinal: Negative.   Genitourinary: Negative.   Musculoskeletal: Negative.   Skin: Negative.   Neurological: Negative.   Endo/Heme/Allergies: Negative.   Psychiatric/Behavioral: Negative.    All other systems reviewed and are negative.     All other systems are reviewed and negative.    PHYSICAL EXAM: VS:  BP 120/62   Pulse 85   Ht 5' 6 (1.676 m)   Wt 159 lb 9.6 oz (72.4 kg)   SpO2 97%   BMI 25.76 kg/m  , BMI Body mass index is 25.76 kg/m. Last weight:  Wt Readings from Last 3 Encounters:  11/06/23 159 lb 9.6 oz (72.4 kg)  10/29/23 161 lb (73 kg)  10/22/23 160 lb 9.6 oz (72.8 kg)     Physical Exam Vitals reviewed.  Constitutional:       Appearance: Normal appearance. He is normal weight.  HENT:     Head: Normocephalic.     Nose: Nose normal.     Mouth/Throat:     Mouth: Mucous membranes are moist.  Eyes:     Pupils: Pupils are equal, round, and reactive to light.  Cardiovascular:     Rate and Rhythm: Normal rate and regular rhythm.     Pulses: Normal pulses.     Heart sounds: Normal heart sounds.  Pulmonary:     Effort: Pulmonary effort is normal.  Abdominal:     General: Abdomen is flat. Bowel sounds are normal.  Musculoskeletal:        General: Normal range of motion.     Cervical back: Normal range of motion.  Skin:    General: Skin is warm.  Neurological:     General: No focal deficit present.     Mental Status: He is alert.  Psychiatric:        Mood and Affect: Mood normal.       EKG:   Recent Labs: 06/25/2023: Hemoglobin 13.3; Platelets 244 10/29/2023: BUN 13; Creatinine, Ser 1.20; Potassium 4.0; Sodium 145    Lipid Panel    Component Value Date/Time   CHOL 202 (H) 11/16/2017 1538   TRIG 221.0 (H) 11/16/2017 1538   TRIG 75 04/28/2013 0000   HDL 40.90 11/16/2017 1538   CHOLHDL 5 11/16/2017 1538   VLDL 44.2 (H) 11/16/2017 1538   LDLCALC 154 (H) 08/31/2015 0900   LDLDIRECT 152.0 11/16/2017 1538      Other studies Reviewed: Additional studies/ records that were reviewed today include:  Review of the above records demonstrates:       No data to display            ASSESSMENT AND PLAN:    ICD-10-CM   1. Coronary artery disease involving native coronary artery of native heart without angina pectoris  I25.10     2. Chronic atrial fibrillation (HCC)  I48.20     3. Lone atrial fibrillation with RVR  I48.91    Currently  in sinus rhythm and on 80 mg sotalol .  Creatinine is 1.2 thus continue sotalol  80 mg once a day and take extra dose of sotalol  if have palpitation.    4. Mixed hyperlipidemia  E78.2        Problem List Items Addressed This Visit       Cardiovascular and  Mediastinum   Lone atrial fibrillation with RVR   CAD (coronary artery disease) - Primary   Chronic atrial fibrillation (HCC)     Other   Hyperlipidemia       Disposition:   Return in about 2 months (around 01/06/2024).    Total time spent: 40 minutes  Signed,  Denyse Bathe, MD  11/06/2023 3:23 PM    [facility]

## 2023-11-11 ENCOUNTER — Ambulatory Visit (INDEPENDENT_AMBULATORY_CARE_PROVIDER_SITE_OTHER): Admitting: Urology

## 2023-11-11 ENCOUNTER — Encounter: Payer: Self-pay | Admitting: Pulmonary Disease

## 2023-11-11 VITALS — BP 125/64 | HR 80 | Ht 66.0 in | Wt 160.0 lb

## 2023-11-11 DIAGNOSIS — N200 Calculus of kidney: Secondary | ICD-10-CM | POA: Diagnosis not present

## 2023-11-11 DIAGNOSIS — N401 Enlarged prostate with lower urinary tract symptoms: Secondary | ICD-10-CM | POA: Diagnosis not present

## 2023-11-11 DIAGNOSIS — N529 Male erectile dysfunction, unspecified: Secondary | ICD-10-CM | POA: Diagnosis not present

## 2023-11-11 DIAGNOSIS — N138 Other obstructive and reflux uropathy: Secondary | ICD-10-CM

## 2023-11-11 LAB — BLADDER SCAN AMB NON-IMAGING

## 2023-11-11 MED ORDER — TAMSULOSIN HCL 0.4 MG PO CAPS: 0.4000 mg | ORAL_CAPSULE | Freq: Every day | ORAL | 3 refills | Status: AC

## 2023-11-11 NOTE — Progress Notes (Signed)
   11/11/2023 2:44 PM   [name] Side 1951/01/16 [id number]  Reason for visit: Follow up nephrolithiasis, BPH, ED, right hydrocele  History: Recurrent kidney stones, required right ureteroscopy March 2024, and left ureteroscopy May 2025 for impacted ureteral stones Urinary symptoms well-controlled on Flomax , emptying improved Diffuse urethral narrowing ED refractory to PDE 5 inhibitors Large right hydrocele and prefers observation  Physical Exam: BP 125/64 (BP Location: Left Arm, Patient Position: Standing)   Pulse 80   Ht 5' 6 (1.676 m)   Wt 160 lb (72.6 kg)   SpO2 95%   BMI 25.82 kg/m   Imaging/labs: Renal function normal, creatinine 1.2, eGFR greater than 60 September 2025  Today: No significant urinary complaints, PVR today normal 8ml ED refractory to PDE 5 inhibitors, not interested in other treatment options Denies any flank pain or gross hematuria Right hydrocele remains minimally bothersome, would like to continue observation  Plan:   Nephrolithiasis: We discussed general stone prevention strategies including adequate hydration with goal of producing 2.5 L of urine daily, increasing citric acid intake, increasing calcium  intake during high oxalate meals, minimizing animal protein, and decreasing salt intake. Information about dietary recommendations given today.  KUB 1 year ED: Refractory to PDE 5 inhibitors, not interested in further treatments BPH: Improved on Flomax , normal PVR Right hydrocele: Minimally bothered prefers observation RTC 1 year KUB prior, PVR   Victor JAYSON Burnet, MD  [facility] [address] [place], [place] [address] [phone]

## 2023-11-11 NOTE — Patient Instructions (Signed)
 Victor Cortez

## 2023-11-12 ENCOUNTER — Ambulatory Visit: Admitting: Urology

## 2023-11-23 ENCOUNTER — Other Ambulatory Visit (HOSPITAL_COMMUNITY): Payer: Self-pay

## 2023-11-24 ENCOUNTER — Other Ambulatory Visit: Payer: Self-pay

## 2023-11-26 ENCOUNTER — Other Ambulatory Visit: Payer: Self-pay | Admitting: Cardiovascular Disease

## 2023-12-08 ENCOUNTER — Other Ambulatory Visit (HOSPITAL_COMMUNITY): Payer: Self-pay

## 2023-12-08 ENCOUNTER — Other Ambulatory Visit: Payer: Self-pay

## 2023-12-10 ENCOUNTER — Other Ambulatory Visit (HOSPITAL_COMMUNITY): Payer: Self-pay

## 2023-12-14 ENCOUNTER — Other Ambulatory Visit: Payer: Self-pay

## 2023-12-14 NOTE — Progress Notes (Signed)
 Specialty Pharmacy Refill Coordination Note  Victor Cortez is a 73 y.o. male contacted today regarding refills of specialty medication(s) Dupilumab  (Dupixent )   Patient requested Delivery   Delivery date: 12/15/23   Verified address: [address], [place], [address]   Medication will be filled on: 12/14/23

## 2023-12-16 ENCOUNTER — Other Ambulatory Visit: Payer: Self-pay | Admitting: Internal Medicine

## 2023-12-25 ENCOUNTER — Other Ambulatory Visit: Payer: Self-pay | Admitting: Cardiovascular Disease

## 2023-12-25 DIAGNOSIS — E782 Mixed hyperlipidemia: Secondary | ICD-10-CM

## 2024-01-04 ENCOUNTER — Other Ambulatory Visit: Payer: Self-pay

## 2024-01-04 NOTE — Progress Notes (Signed)
 Specialty Pharmacy Refill Coordination Note  Victor Cortez is a 73 y.o. male contacted today regarding refills of specialty medication(s) Dupilumab  (Dupixent )   Patient requested Delivery   Delivery date: 01/12/24   Verified address: [address] [place], [address]   Medication will be filled on: 01/11/24

## 2024-01-08 ENCOUNTER — Encounter: Payer: Self-pay | Admitting: Cardiovascular Disease

## 2024-01-08 ENCOUNTER — Ambulatory Visit (INDEPENDENT_AMBULATORY_CARE_PROVIDER_SITE_OTHER): Admitting: Cardiovascular Disease

## 2024-01-08 VITALS — BP 120/74 | HR 74 | Ht 66.0 in | Wt 162.0 lb

## 2024-01-08 DIAGNOSIS — I2510 Atherosclerotic heart disease of native coronary artery without angina pectoris: Secondary | ICD-10-CM

## 2024-01-08 DIAGNOSIS — I480 Paroxysmal atrial fibrillation: Secondary | ICD-10-CM | POA: Diagnosis not present

## 2024-01-08 DIAGNOSIS — E782 Mixed hyperlipidemia: Secondary | ICD-10-CM

## 2024-01-08 DIAGNOSIS — I340 Nonrheumatic mitral (valve) insufficiency: Secondary | ICD-10-CM | POA: Diagnosis not present

## 2024-01-08 DIAGNOSIS — Z0130 Encounter for examination of blood pressure without abnormal findings: Secondary | ICD-10-CM

## 2024-01-08 MED ORDER — APIXABAN 5 MG PO TABS: 5.0000 mg | ORAL_TABLET | Freq: Two times a day (BID) | ORAL | 0 refills | Status: DC

## 2024-01-08 NOTE — Progress Notes (Signed)
 Cardiology Office Note   Date:  01/08/2024   ID:  Cortez Victor, DOB Feb 28, 1950, MRN [id number]  PCP:  Victor Baller, MD  Cardiologist:  Victor Bathe, MD      History of Present Illness: Victor Cortez is a 73 y.o. male who presents for  Chief Complaint  Patient presents with   Follow-up    2 month follow up    Doing well.      Past Medical History:  Diagnosis Date   Aortic atherosclerosis    Arthritis    Atrial fibrillation with RVR (HCC) 06/2013   a.) CHA2DS2VASc = 3 (age, HTN, vascular disease history); b.) rate/rhythm maintained on oral sotalol ; no chronic anticoagulation   Basal cell carcinoma of nose 1998   Bilateral inguinal hernia    Cholelithiasis 10/2017   COPD (chronic obstructive pulmonary disease) (HCC)    Coronary artery calcification seen on CT scan    Diastolic dysfunction    a.) TTE 07/29/2021: EF >55%, mild LVH, mild-mod MR, G1DD   Dyspnea    ED (erectile dysfunction)    a.) on PDE5i (sildenafil ) PRN   Ex-smoker    GERD (gastroesophageal reflux disease)    Glaucoma    Hepatic steatosis    History of bilateral cataract extraction 07/2020   History of chicken pox    History of kidney stones    History of pneumonia 06/2013   with sepsis and [facility] hospitalization   Hyperlipidemia    Hypertension    Nephrolithiasis    Pneumonia    Right hydrocele    Rosacea    Seborrheic dermatitis    Umbilical hernia    Urethral stricture    Wears dentures    full upper, partial lower     Past Surgical History:  Procedure Laterality Date   CATARACT EXTRACTION W/PHACO Left 07/10/2020   Procedure: CATARACT EXTRACTION PHACO AND INTRAOCULAR LENS PLACEMENT (IOC) LEFT VIVITY TORIC 9.00 00:51.4;  Surgeon: Victor Fallow, MD;  Location: [facility];  Service: Ophthalmology;  Laterality: Left;   CATARACT EXTRACTION W/PHACO Right 07/24/2020   Procedure: CATARACT EXTRACTION PHACO AND INTRAOCULAR LENS PLACEMENT (IOC) RIGHT;  Surgeon: Victor Fallow, MD;  Location: [facility];  Service: Ophthalmology;  Laterality: Right;  6.65 0:41.0   COLONOSCOPY     CYSTOSCOPY WITH LITHOLAPAXY N/A 05/02/2022   Procedure: CYSTOSCOPY WITH LITHOLAPAXY;  Surgeon: Victor Redell BROCKS, MD;  Location: [facility];  Service: Urology;  Laterality: N/A;   CYSTOSCOPY WITH URETHRAL DILATATION  12/04/2017   Procedure: CYSTOSCOPY WITH URETHRAL DILATATION;  Surgeon: Victor Redell BROCKS, MD;  Location: [facility];  Service: Urology;;   CYSTOSCOPY WITH URETHRAL DILATATION N/A 05/02/2022   Procedure: CYSTOSCOPY  URETHRAL DILATATION;  Surgeon: Victor Redell BROCKS, MD;  Location: [facility];  Service: Urology;  Laterality: N/A;   CYSTOSCOPY/URETEROSCOPY/HOLMIUM LASER/STENT PLACEMENT Left 12/04/2017   Procedure: CYSTOSCOPY/URETEROSCOPY/HOLMIUM LASER/STENT PLACEMENT;  Surgeon: Victor Redell BROCKS, MD;  Location: [facility];  Service: Urology;  Laterality: Left;   CYSTOSCOPY/URETEROSCOPY/HOLMIUM LASER/STENT PLACEMENT Right 05/02/2022   Procedure: CYSTOSCOPY/URETEROSCOPY/HOLMIUM LASER/STENT PLACEMENT/RETROGRADE WITH EBONHMJF;  Surgeon: Victor Redell BROCKS, MD;  Location: [facility];  Service: Urology;  Laterality: Right;   CYSTOSCOPY/URETEROSCOPY/HOLMIUM LASER/STENT PLACEMENT Left 06/26/2023   Procedure: CYSTOSCOPY/URETEROSCOPY/HOLMIUM LASER/STENT PLACEMENT;  Surgeon: Victor Redell BROCKS, MD;  Location: [facility];  Service: Urology;  Laterality: Left;   ESOPHAGOGASTRODUODENOSCOPY (EGD) WITH PROPOFOL  N/A 12/21/2018   normal esophagus, normal stomach, peptic duodenitis - done for 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 60 tablet 0   ascorbic acid (VITAMIN C) 500 MG tablet Take 1,000 mg by mouth daily.     BREZTRI  AEROSPHERE 160-9-4.8 MCG/ACT AERO inhaler INHALE 2 PUFFS INTO THE LUNGS  TWICE DAILY (MORNING AND AT BEDTIME) AS DIRECTED. 10.7 g 5   brinzolamide  (AZOPT ) 1 % ophthalmic suspension Place 1 drop into both eyes 2 (two) times daily.     calcium  carbonate (TUMS) 500 MG chewable tablet Chew 1 tablet (200 mg of elemental calcium  total) by mouth daily as needed for indigestion or heartburn.     Cholecalciferol (VITAMIN D3 MAXIMUM STRENGTH) 125 MCG (5000 UT) capsule Take 5,000 Units by mouth daily.     clotrimazole (MYCELEX) 10 MG troche Take 10 mg by mouth as needed.     Cyanocobalamin  1000 MCG TBCR Take 1 tablet by mouth daily.     dorzolamide (TRUSOPT) 2 % ophthalmic solution Place 1 drop into both eyes 2 (two) times daily.     Dupilumab  (DUPIXENT ) 300 MG/2ML SOAJ Inject 300 mg into the skin every 14 (fourteen) days. 4 mL 3   Ensifentrine  (OHTUVAYRE ) 3 MG/2.5ML SUSP Inhale 2.5 mLs into the lungs 2 (two) times daily.     levalbuterol  (XOPENEX  HFA) 45 MCG/ACT inhaler INHALE 2 PUFFS INTO THE LUNGS EVERY 4 HOURS AS NEEDED FOR WHEEZING 15 g 2   losartan  (COZAAR ) 25 MG tablet TAKE ONE TABLET (25 MG) BY MOUTH ONCE DAILY 90 tablet 1   Multiple Vitamin (MULTI-VITAMINS) TABS Take 2 tablets by mouth daily.     rosuvastatin  (CRESTOR ) 40 MG tablet TAKE 1 TABLET BY MOUTH ONCE DAILY 90 tablet 0   sotalol  (BETAPACE ) 80 MG tablet TAKE ONE TABLET BY MOUTH TWICE DAILY 180 tablet 0   tamsulosin  (FLOMAX ) 0.4 MG CAPS capsule Take 1 capsule (0.4 mg total) by mouth daily. 90 capsule 3   TURMERIC-GINGER PO Take 2 capsules by mouth daily.     vitamin E 180 MG (400 UNITS) capsule Take 400 Units by mouth daily.     No current facility-administered medications for this visit.    Allergies:   Xarelto  [rivaroxaban ]    Social History:   reports that he quit smoking about 10 years ago. His smoking use included cigarettes. He started smoking about 63 years ago. He has a 106 pack-year smoking history. He has been exposed to tobacco smoke. He has never used smokeless tobacco. He reports that he does not  drink alcohol and does not use drugs.   Family History:  family history includes COPD in his father and mother; Cancer in his father.    ROS:     Review of Systems  Constitutional: Negative.   HENT: Negative.    Eyes: Negative.   Respiratory: Negative.    Gastrointestinal: Negative.   Genitourinary: Negative.   Musculoskeletal: Negative.   Skin: Negative.   Neurological: Negative.   Endo/Heme/Allergies: Negative.   Psychiatric/Behavioral: Negative.    All other systems reviewed and are negative.     All other systems are reviewed and negative.    PHYSICAL EXAM: VS:  BP 120/74   Pulse 74   Ht 5' 6 (1.676 m)   Wt 162 lb (73.5 kg)   SpO2 97%   BMI 26.15 kg/m  , BMI Body mass index is 26.15 kg/m.  Last weight:  Wt Readings from Last 3 Encounters:  01/08/24 162 lb (73.5 kg)  11/11/23 160 lb (72.6 kg)  11/06/23 159 lb 9.6 oz (72.4 kg)     Physical Exam Vitals reviewed.  Constitutional:      Appearance: Normal appearance. He is normal weight.  HENT:     Head: Normocephalic.     Nose: Nose normal.     Mouth/Throat:     Mouth: Mucous membranes are moist.  Eyes:     Pupils: Pupils are equal, round, and reactive to light.  Cardiovascular:     Rate and Rhythm: Normal rate and regular rhythm.     Pulses: Normal pulses.     Heart sounds: Normal heart sounds.  Pulmonary:     Effort: Pulmonary effort is normal.  Abdominal:     General: Abdomen is flat. Bowel sounds are normal.  Musculoskeletal:        General: Normal range of motion.     Cervical back: Normal range of motion.  Skin:    General: Skin is warm.  Neurological:     General: No focal deficit present.     Mental Status: He is alert.  Psychiatric:        Mood and Affect: Mood normal.       EKG:   Recent Labs: 06/25/2023: Hemoglobin 13.3; Platelets 244 10/29/2023: BUN 13; Creatinine, Ser 1.20; Potassium 4.0; Sodium 145    Lipid Panel    Component Value Date/Time   CHOL 202 (H) 11/16/2017 1538    TRIG 221.0 (H) 11/16/2017 1538   TRIG 75 04/28/2013 0000   HDL 40.90 11/16/2017 1538   CHOLHDL 5 11/16/2017 1538   VLDL 44.2 (H) 11/16/2017 1538   LDLCALC 154 (H) 08/31/2015 0900   LDLDIRECT 152.0 11/16/2017 1538      Other studies Reviewed: Additional studies/ records that were reviewed today include:  Review of the above records demonstrates:       No data to display            ASSESSMENT AND PLAN:    ICD-10-CM   1. Coronary artery disease involving native coronary artery of native heart without angina pectoris  I25.10 apixaban  (ELIQUIS ) 5 MG TABS tablet    2. Mixed hyperlipidemia  E78.2 apixaban  (ELIQUIS ) 5 MG TABS tablet    3. Nonrheumatic mitral valve regurgitation  I34.0 apixaban  (ELIQUIS ) 5 MG TABS tablet    4. Paroxysmal atrial fibrillation (HCC)  I48.0 apixaban  (ELIQUIS ) 5 MG TABS tablet   IN NSR, will start eliques and stop asp       Problem List Items Addressed This Visit       Cardiovascular and Mediastinum   CAD (coronary artery disease) - Primary   Relevant Medications   apixaban  (ELIQUIS ) 5 MG TABS tablet     Other   Hyperlipidemia   Relevant Medications   apixaban  (ELIQUIS ) 5 MG TABS tablet   Other Visit Diagnoses       Nonrheumatic mitral valve regurgitation       Relevant Medications   apixaban  (ELIQUIS ) 5 MG TABS tablet     Paroxysmal atrial fibrillation (HCC)       IN NSR, will start eliques and stop asp   Relevant Medications   apixaban  (ELIQUIS ) 5 MG TABS tablet          Disposition:   Return in about 3 months (around 04/07/2024).    Total time spent: 35 minutes  Signed,  Victor Bathe, MD  01/08/2024 2:31  PM    [facility]

## 2024-01-11 ENCOUNTER — Other Ambulatory Visit: Payer: Self-pay

## 2024-01-21 ENCOUNTER — Ambulatory Visit: Admitting: Pulmonary Disease

## 2024-01-21 ENCOUNTER — Encounter: Payer: Self-pay | Admitting: Pulmonary Disease

## 2024-01-21 VITALS — BP 122/60 | HR 75 | Temp 97.7°F | Ht 66.0 in | Wt 165.2 lb

## 2024-01-21 DIAGNOSIS — J45909 Unspecified asthma, uncomplicated: Secondary | ICD-10-CM | POA: Diagnosis not present

## 2024-01-21 DIAGNOSIS — J329 Chronic sinusitis, unspecified: Secondary | ICD-10-CM | POA: Diagnosis not present

## 2024-01-21 DIAGNOSIS — D721 Eosinophilia, unspecified: Secondary | ICD-10-CM

## 2024-01-21 DIAGNOSIS — R0602 Shortness of breath: Secondary | ICD-10-CM

## 2024-01-21 DIAGNOSIS — J4489 Other specified chronic obstructive pulmonary disease: Secondary | ICD-10-CM

## 2024-01-21 DIAGNOSIS — J449 Chronic obstructive pulmonary disease, unspecified: Secondary | ICD-10-CM

## 2024-01-21 NOTE — Progress Notes (Signed)
 Subjective:    Patient ID: Victor Cortez, male    DOB: 03-12-1950, 73 y.o.   MRN: [id number]  Patient Care Team: Rilla Baller, MD as PCP - General (Family Medicine) Tamea Dedra CROME, MD as Consulting Physician (Pulmonary Disease)  Chief Complaint  Patient presents with   COPD    Cough, shortness of breath and wheezing on exertion.     BACKGROUND/INTERVAL: 73 year old male, former very heavy smoker followed for emphysema and lung nodules. He is a former patient of Dr. Harriet and last seen in office 04/11/2021 by Dr.Icard.  Last seen here by Comer Rouleau, NP on 27 May 2023.  Past medical history significant for CAD, hx of a fib, GERD, hepatic steatosis, HLD. He has a history of ptx treated with tube thoracostomy by Dr. Army December 2021.  I initially saw the patient on 23 July 2023 last visit here was 22 October 2023 and at that time started on Dupixent .  No exacerbations since the last visit.   HPI Discussed the use of AI scribe software for clinical note transcription with the patient, who gave verbal consent to proceed.  History of Present Illness   Victor Cortez is a 73 year old male with COPD asthma overlap who presents for follow-up.  He has received approximately six doses of Dupixent  but is unsure of the exact number. He cannot yet determine if there is a significant improvement from the treatment though he does note that his dyspnea has improved overall.  He reports that he is using his inhalers and does not currently need any refills.  No issues with chest pain, paroxysmal nocturnal dyspnea or lower extremity edema.  No calf tenderness.  No cough or sputum production.  No hemoptysis.     DATA 12/11/2022 echo: nl EF, RV function. Mild TR. Mild PH. Mild mitral regurgitation.  04/13/2023 LDCT chest: atherosclerosis/CAD. Mild to moderate emphysema. Clustered nodularity in the LUL, likely due to prior infectious bronchiolitis. Scattered tiny calcified nodules,  up to 2.8 mm. Lung RADS 1. 06/25/2023 eosinophils: 300 cells/uL 07/23/2023 PFTs: FEV1 0.99 L or 38% predicted, FVC 2.14 L or 59% predicted, FEV1/FVC 46%, there is significant bronchodilator response.  There is significant hyperinflation and air trapping diffusion capacity is severely impaired.  This is consistent with severe obstructive airways disease on the basis of emphysema.  There is a reversible component.  11/03/2023 Dupixent  start: Dupixent  started for asthma/COPD/eosinophilia.  Review of Systems A 10 point review of systems was performed and it is as noted above otherwise negative.   Patient Active Problem List   Diagnosis Date Noted   Right ureteral stone 04/18/2022   Acquired renal cyst of left kidney 04/18/2022   Hydronephrosis with urinary obstruction due to ureteral calculus 04/18/2022   Left lower lobe consolidation 03/11/2019   Chronic atrial fibrillation (HCC)    Pneumothorax, left 03/07/2019   Cholelithiasis 03/01/2019   Hepatic steatosis 03/01/2019   Lymphangioma 03/01/2019   Duodenitis determined by biopsy 03/01/2019   Right hand pain 03/01/2019   Pre-op evaluation 11/16/2017   Abnormal CT scan, gallbladder 11/16/2017   Rash of face 11/16/2017   Left ureteral stone 11/05/2017   CAD (coronary artery disease) 09/29/2016   Medicare annual wellness visit, initial 08/31/2015   Advanced care planning/counseling discussion 08/31/2015   Right hydrocele 11/21/2014   Right anterior shoulder pain 02/23/2014   Renal insufficiency 02/23/2014   Ex-smoker 07/11/2013   COPD (chronic obstructive pulmonary disease) (HCC)    Glaucoma  Hyperlipidemia    GERD (gastroesophageal reflux disease)    History of pneumonia 06/03/2013   Lone atrial fibrillation with RVR 06/03/2013    Social History   Tobacco Use   Smoking status: Former    Current packs/day: 0.00    Average packs/day: 2.0 packs/day for 53.0 years (106.0 ttl pk-yrs)    Types: Cigarettes    Start date: 06/03/1960     Quit date: 06/03/2013    Years since quitting: 10.6    Passive exposure: Past   Smokeless tobacco: Never  Substance Use Topics   Alcohol use: No    Allergies[1]  Active Medications[2]  Immunization History  Administered Date(s) Administered   Fluad Quad(high Dose 65+) 10/31/2021   Hepatitis B 04/07/2005, 05/08/2005, 07/02/2005   INFLUENZA, HIGH DOSE SEASONAL PF 11/16/2018, 10/26/2019, 10/22/2023   Influenza,inj,Quad PF,6+ Mos 11/07/2014   Influenza-Unspecified 12/04/2017   Moderna Covid-19 Fall Seasonal Vaccine 71yrs & older 11/20/2021   Moderna SARS-COV2 Booster Vaccination 02/17/2020   Moderna Sars-Covid-2 Vaccination 04/27/2019, 05/25/2019   Pneumococcal Conjugate-13 07/11/2013   Pneumococcal Polysaccharide-23 02/03/2010, 08/31/2015   Td 08/04/2007   Tdap 11/25/2012   Zoster Recombinant(Shingrix) 02/19/2018, 06/08/2018   Zoster, Live 05/06/2011        Objective:     Vitals:   01/21/24 1336  BP: 122/60  Pulse: 75  Temp: 97.7 F (36.5 C)  Height: 5' 6 (1.676 m)  Weight: 165 lb 3.2 oz (74.9 kg)  SpO2: 96%  TempSrc: Temporal  BMI (Calculated): 26.68     SpO2: 96 %  GENERAL: Well-developed, well-nourished gentleman, no acute distress. HEAD: Normocephalic, atraumatic.  EYES: Pupils equal, round, reactive to light.  No scleral icterus.  MOUTH: Poor dentition (upper dentures, partial lower, missing teeth).  Oral mucosa moist.  No thrush. NECK: Supple. No thyromegaly. Trachea midline. No JVD.  No adenopathy. PULMONARY: Good air entry bilaterally.  No adventitious sounds. CARDIOVASCULAR: S1 and S2. Regular rate and rhythm.  ABDOMEN: Benign. MUSCULOSKELETAL: No joint deformity, no clubbing, no edema.  NEUROLOGIC: No overt focal deficit, no gait disturbance, speech is fluent. SKIN: Intact,warm,dry. PSYCH: Mood and behavior normal.        Assessment & Plan:     ICD-10-CM   1. Asthma-COPD overlap syndrome (HCC)  J44.89     2. Stage 3 severe COPD by GOLD  classification (HCC)  J44.9     3. Chronic rhinosinusitis  J32.9     4. Eosinophilia, unspecified type  D72.10     5. Shortness of breath  R06.02    IMPROVED     Discussion:    Asthma-COPD overlap syndrome Lungs are clearer compared to the last visit, indicating effective control of wheezing and inflammation. He has received approximately six doses of Dupixent , with anticipated improvement in symptoms around the six-month to one-year mark. - Continue Dupixent  for asthma/COPD overlap syndrome - Scheduled follow-up appointment in four months  - Continue Breztri , Ohtuvayre  and as needed albuterol     Follow-up in 4 months time.  Advised if symptoms do not improve or worsen, to please contact office for sooner follow up or seek emergency care.    I spent 30 minutes of dedicated to the care of this patient on the date of this encounter to include pre-visit review of records, face-to-face time with the patient discussing conditions above, post visit ordering of testing, clinical documentation with the electronic health record, making appropriate referrals as documented, and communicating necessary findings to members of the patients care team.     C.  Leita Sanders, MD Advanced Bronchoscopy PCCM [facility]    *This note was generated using voice recognition software/Dragon and/or AI transcription program.  Despite best efforts to proofread, errors can occur which can change the meaning. Any transcriptional errors that result from this process are unintentional and may not be fully corrected at the time of dictation.    [1]  Allergies Allergen Reactions   Xarelto  [Rivaroxaban ] Cough    Coughed up blood  [2]  Current Meds  Medication Sig   apixaban  (ELIQUIS ) 5 MG TABS tablet Take 1 tablet (5 mg total) by mouth 2 (two) times daily.   ascorbic acid (VITAMIN C) 500 MG tablet Take 1,000 mg by mouth daily.   BREZTRI  AEROSPHERE 160-9-4.8 MCG/ACT AERO inhaler INHALE 2  PUFFS INTO THE LUNGS TWICE DAILY (MORNING AND AT BEDTIME) AS DIRECTED.   brinzolamide  (AZOPT ) 1 % ophthalmic suspension Place 1 drop into both eyes 2 (two) times daily.   calcium  carbonate (TUMS) 500 MG chewable tablet Chew 1 tablet (200 mg of elemental calcium  total) by mouth daily as needed for indigestion or heartburn.   Cholecalciferol (VITAMIN D3 MAXIMUM STRENGTH) 125 MCG (5000 UT) capsule Take 5,000 Units by mouth daily.   clotrimazole (MYCELEX) 10 MG troche Take 10 mg by mouth as needed.   Cyanocobalamin  1000 MCG TBCR Take 1 tablet by mouth daily.   dorzolamide (TRUSOPT) 2 % ophthalmic solution Place 1 drop into both eyes 2 (two) times daily.   Dupilumab  (DUPIXENT ) 300 MG/2ML SOAJ Inject 300 mg into the skin every 14 (fourteen) days.   Ensifentrine  (OHTUVAYRE ) 3 MG/2.5ML SUSP Inhale 2.5 mLs into the lungs 2 (two) times daily.   levalbuterol  (XOPENEX  HFA) 45 MCG/ACT inhaler INHALE 2 PUFFS INTO THE LUNGS EVERY 4 HOURS AS NEEDED FOR WHEEZING   losartan  (COZAAR ) 25 MG tablet TAKE ONE TABLET (25 MG) BY MOUTH ONCE DAILY   Multiple Vitamin (MULTI-VITAMINS) TABS Take 2 tablets by mouth daily.   rosuvastatin  (CRESTOR ) 40 MG tablet TAKE 1 TABLET BY MOUTH ONCE DAILY   sotalol  (BETAPACE ) 80 MG tablet TAKE ONE TABLET BY MOUTH TWICE DAILY   tamsulosin  (FLOMAX ) 0.4 MG CAPS capsule Take 1 capsule (0.4 mg total) by mouth daily.   TURMERIC-GINGER PO Take 2 capsules by mouth daily.   vitamin E 180 MG (400 UNITS) capsule Take 400 Units by mouth daily.

## 2024-01-21 NOTE — Patient Instructions (Signed)
 VISIT SUMMARY:  Victor Cortez, a 73 year old male with COPD asthma overlap, came in for a follow-up visit. He has been using his inhalers and has received approximately six doses of Dupixent , though he is unsure of the exact number. He reports no issues with snoring.  YOUR PLAN:  -ASTHMA-COPD OVERLAP SYNDROME: Asthma-COPD overlap syndrome is a condition where a person has symptoms of both asthma and chronic obstructive pulmonary disease (COPD). Your lungs are clearer compared to the last visit, indicating effective control of wheezing and inflammation. You have received approximately six doses of Dupixent , and improvement in symptoms is expected around the six-month to one-year mark. Please continue using Dupixent  as prescribed.  Continue using your Breztri , Ohtuvayre  and Dupixent .  INSTRUCTIONS:  Please schedule a follow-up appointment in four months.

## 2024-02-01 ENCOUNTER — Other Ambulatory Visit: Payer: Self-pay

## 2024-02-01 NOTE — Progress Notes (Signed)
 Specialty Pharmacy Refill Coordination Note  Victor Cortez is a 73 y.o. male contacted today regarding refills of specialty medication(s) Dupilumab  (Dupixent )   Patient requested Delivery   Delivery date: 02/03/24   Verified address: [address], [place], [address]   Medication will be filled on: 02/02/24

## 2024-02-02 ENCOUNTER — Other Ambulatory Visit: Payer: Self-pay

## 2024-02-05 ENCOUNTER — Other Ambulatory Visit: Payer: Self-pay | Admitting: Cardiovascular Disease

## 2024-02-05 DIAGNOSIS — I2510 Atherosclerotic heart disease of native coronary artery without angina pectoris: Secondary | ICD-10-CM

## 2024-02-05 DIAGNOSIS — E782 Mixed hyperlipidemia: Secondary | ICD-10-CM

## 2024-02-05 DIAGNOSIS — I340 Nonrheumatic mitral (valve) insufficiency: Secondary | ICD-10-CM

## 2024-02-05 DIAGNOSIS — I480 Paroxysmal atrial fibrillation: Secondary | ICD-10-CM

## 2024-02-26 ENCOUNTER — Other Ambulatory Visit: Payer: Self-pay

## 2024-02-29 ENCOUNTER — Other Ambulatory Visit: Payer: Self-pay

## 2024-03-01 ENCOUNTER — Other Ambulatory Visit (HOSPITAL_COMMUNITY): Payer: Self-pay

## 2024-03-01 ENCOUNTER — Other Ambulatory Visit: Payer: Self-pay

## 2024-03-01 ENCOUNTER — Other Ambulatory Visit: Payer: Self-pay | Admitting: Cardiovascular Disease

## 2024-03-01 NOTE — Telephone Encounter (Signed)
 Created in error

## 2024-03-02 ENCOUNTER — Ambulatory Visit: Attending: Pulmonary Disease

## 2024-03-02 ENCOUNTER — Other Ambulatory Visit: Payer: Self-pay

## 2024-03-02 DIAGNOSIS — J4489 Other specified chronic obstructive pulmonary disease: Secondary | ICD-10-CM

## 2024-03-02 DIAGNOSIS — Z79899 Other long term (current) drug therapy: Secondary | ICD-10-CM

## 2024-03-02 MED ORDER — DUPIXENT 300 MG/2ML ~~LOC~~ SOAJ
300.0000 mg | SUBCUTANEOUS | 0 refills | Status: AC
  Filled 2024-03-02: qty 4, 28d supply, fill #0

## 2024-03-02 NOTE — Progress Notes (Signed)
 Specialty Pharmacy Refill Coordination Note  Victor Cortez is a 74 y.o. male contacted today regarding refills of specialty medication(s) Dupilumab  (Dupixent )   Patient requested Delivery   Delivery date: 03/04/24   Verified address: [address] [place]   Medication will be filled on: 03/02/24

## 2024-03-02 NOTE — Progress Notes (Signed)
 [facility] - Continuation of Therapy with Biologic  Referring Provider: Dedra Sanders  Virtual Visit via Telephone Note  I connected with Victor Cortez Side on 03/02/24 at  2:30 PM EST by telephone and verified that I am speaking with the correct person using two identifiers.  Location: Patient: home Provider: office   I discussed the limitations, risks, security and privacy concerns of performing an evaluation and management service by telephone and the availability of in person appointments. I also discussed with the patient that there may be a patient responsible charge related to this service. The patient expressed understanding and agreed to proceed.  HPI: Victor Cortez is a 74 y.o. male who presents to the pharmacotherapy clinic via telephone for continuation of therapy with Dupixent . Last OV with Dedra Sanders was on 01/21/24.   Indication: Asthma/COPD overlap Dosing: 300mg  every 14 days  Patient's current respiratory regimen: Breztri , Ohtuvayre , Albuterol  PRN  Patient Active Problem List   Diagnosis Date Noted   Right ureteral stone 04/18/2022   Acquired renal cyst of left kidney 04/18/2022   Hydronephrosis with urinary obstruction due to ureteral calculus 04/18/2022   Left lower lobe consolidation 03/11/2019   Chronic atrial fibrillation (HCC)    Pneumothorax, left 03/07/2019   Cholelithiasis 03/01/2019   Hepatic steatosis 03/01/2019   Lymphangioma 03/01/2019   Duodenitis determined by biopsy 03/01/2019   Right hand pain 03/01/2019   Pre-op evaluation 11/16/2017   Abnormal CT scan, gallbladder 11/16/2017   Rash of face 11/16/2017   Left ureteral stone 11/05/2017   CAD (coronary artery disease) 09/29/2016   Medicare annual wellness visit, initial 08/31/2015   Advanced care planning/counseling discussion 08/31/2015   Right hydrocele 11/21/2014   Right anterior shoulder pain 02/23/2014   Renal insufficiency 02/23/2014   Ex-smoker 07/11/2013    COPD (chronic obstructive pulmonary disease) (HCC)    Glaucoma    Hyperlipidemia    GERD (gastroesophageal reflux disease)    History of pneumonia 06/03/2013   Lone atrial fibrillation with RVR 06/03/2013    Patient's Medications  New Prescriptions   No medications on file  Previous Medications   ASCORBIC ACID (VITAMIN C) 500 MG TABLET    Take 1,000 mg by mouth daily.   BREZTRI  AEROSPHERE 160-9-4.8 MCG/ACT AERO INHALER    INHALE 2 PUFFS INTO THE LUNGS TWICE DAILY (MORNING AND AT BEDTIME) AS DIRECTED.   BRINZOLAMIDE  (AZOPT ) 1 % OPHTHALMIC SUSPENSION    Place 1 drop into both eyes 2 (two) times daily.   CALCIUM  CARBONATE (TUMS) 500 MG CHEWABLE TABLET    Chew 1 tablet (200 mg of elemental calcium  total) by mouth daily as needed for indigestion or heartburn.   CHOLECALCIFEROL (VITAMIN D3 MAXIMUM STRENGTH) 125 MCG (5000 UT) CAPSULE    Take 5,000 Units by mouth daily.   CLOTRIMAZOLE (MYCELEX) 10 MG TROCHE    Take 10 mg by mouth as needed.   CYANOCOBALAMIN  1000 MCG TBCR    Take 1 tablet by mouth daily.   DORZOLAMIDE (TRUSOPT) 2 % OPHTHALMIC SOLUTION    Place 1 drop into both eyes 2 (two) times daily.   ELIQUIS  5 MG TABS TABLET    TAKE ONE TABLET (5 MG) BY MOUTH TWICE DAILY   ENSIFENTRINE  (OHTUVAYRE ) 3 MG/2.5ML SUSP    Inhale 2.5 mLs into the lungs 2 (two) times daily.   LEVALBUTEROL  (XOPENEX  HFA) 45 MCG/ACT INHALER    INHALE 2 PUFFS INTO THE LUNGS EVERY 4 HOURS AS NEEDED FOR WHEEZING   LOSARTAN  (COZAAR ) 25  MG TABLET    TAKE ONE TABLET (25 MG) BY MOUTH ONCE DAILY   MULTIPLE VITAMIN (MULTI-VITAMINS) TABS    Take 2 tablets by mouth daily.   ROSUVASTATIN  (CRESTOR ) 40 MG TABLET    TAKE 1 TABLET BY MOUTH ONCE DAILY   SOTALOL  (BETAPACE ) 80 MG TABLET    TAKE ONE TABLET BY MOUTH TWICE DAILY   TAMSULOSIN  (FLOMAX ) 0.4 MG CAPS CAPSULE    Take 1 capsule (0.4 mg total) by mouth daily.   TURMERIC-GINGER PO    Take 2 capsules by mouth daily.   VITAMIN E 180 MG (400 UNITS) CAPSULE    Take 400 Units by mouth  daily.  Modified Medications   Modified Medication Previous Medication   DUPILUMAB  (DUPIXENT ) 300 MG/2ML SOAJ Dupilumab  (DUPIXENT ) 300 MG/2ML SOAJ      Inject 300 mg into the skin every 14 (fourteen) days.    Inject 300 mg into the skin every 14 (fourteen) days.  Discontinued Medications   No medications on file    Allergies: Allergies[1]  Past Medical History: Past Medical History:  Diagnosis Date   Aortic atherosclerosis    Arthritis    Atrial fibrillation with RVR (HCC) 06/2013   a.) CHA2DS2VASc = 3 (age, HTN, vascular disease history); b.) rate/rhythm maintained on oral sotalol ; no chronic anticoagulation   Basal cell carcinoma of nose 1998   Bilateral inguinal hernia    Cholelithiasis 10/2017   COPD (chronic obstructive pulmonary disease) (HCC)    Coronary artery calcification seen on CT scan    Diastolic dysfunction    a.) TTE 07/29/2021: EF >55%, mild LVH, mild-mod MR, G1DD   Dyspnea    ED (erectile dysfunction)    a.) on PDE5i (sildenafil ) PRN   Ex-smoker    GERD (gastroesophageal reflux disease)    Glaucoma    Hepatic steatosis    History of bilateral cataract extraction 07/2020   History of chicken pox    History of kidney stones    History of pneumonia 06/2013   with sepsis and [facility] hospitalization   Hyperlipidemia    Hypertension    Nephrolithiasis    Pneumonia    Right hydrocele    Rosacea    Seborrheic dermatitis    Umbilical hernia    Urethral stricture    Wears dentures    full upper, partial lower    Social History: Social History   Socioeconomic History   Marital status: Widowed    Spouse name: Not on file   Number of children: 1   Years of education: Not on file   Highest education level: Not on file  Occupational History   Not on file  Tobacco Use   Smoking status: Former    Current packs/day: 0.00    Average packs/day: 2.0 packs/day for 53.0 years (106.0 ttl pk-yrs)    Types: Cigarettes    Start date: 06/03/1960    Quit date:  06/03/2013    Years since quitting: 10.7    Passive exposure: Past   Smokeless tobacco: Never  Vaping Use   Vaping status: Never Used  Substance and Sexual Activity   Alcohol use: No   Drug use: No   Sexual activity: Not on file  Other Topics Concern   Not on file  Social History Narrative   Retired   Lives with wife and son, 2 dogs, bird, fish (wife died 03/02/20)   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[place], now part time [employer]   Activity: rides bicycle  Diet: some water, fruits/vegetables daily   Social Drivers of Health   Tobacco Use: Medium Risk (01/21/2024)   Patient History    Smoking Tobacco Use: Former    Smokeless Tobacco Use: Never    Passive Exposure: Past  Programmer, Applications: Not on [employer] Insecurity: Not on file  Transportation Needs: Not on file  Physical Activity: Not on file  Stress: Not on file  Social Connections: Not on file  Depression (EYV7-0): Not on file  Alcohol Screen: Not on file  Housing: Not on file  Utilities: Not on file  Health Literacy: Not on file      Assessment/Plan: 1. Patient prescribed Dupixent  for Asthma/COPD overlap. Reviewed the medication with the patient, including the following:   Goals of therapy: Mechanism: monoclonal antibody used for the treatment of asthma or asthma/COPD overlap Reviewed that Dupixent  is add-on medication and patient must continue maintenance inhaler regimen. Patient states that he sometimes only takes Breztri  once daily due to cost. Urged patient to continue twice daily as prescribed as best as he can to maintain optimal control of his condition.  Response to therapy: may take 3-4 months to determine efficacy.  Side effects: injection site reaction, antibody development, arthralgia, ocular effects  Dose: Dupixent  300mg  once every 2 weeks  Administration/Storage:  Administer as a SubQ injection and rotate sites.  Keep medication in refrigerator, do not freeze.  Allow the  medication to reach room temp prior to administration (45 mins for 300 mg syringe or 30 min for 200 mg syringe). Do not shake.   Plan:  - Patient will continue injections once every 2 weeks at home.  - Rx will be triaged to [facility] for delivery to patient home.   I discussed the assessment and treatment plan with the patient. The patient was provided an opportunity to ask questions and all were answered. The patient agreed with the plan and demonstrated an understanding of the instructions.   The patient was advised to call back or seek an in-person evaluation if the symptoms worsen or if the condition fails to improve as anticipated.  I provided 15 minutes of non-face-to-face time during this encounter.  Patient verbalizes understanding and agreement with plan.   Victor Cortez, PharmD, CSP, AAHIVP, CPP Clinical Pharmacist Practitioner - Medication Therapy Disease Management/Specialty Pharmacy Services 03/02/2024, 2:54 PM     [1]  Allergies Allergen Reactions   Xarelto  [Rivaroxaban ] Cough    Coughed up blood

## 2024-03-02 NOTE — Progress Notes (Signed)
 Clinical Intervention Note  Clinical Intervention Notes: Patient initiated treatment with Eliquis . No DDIs identified with Dupixent .   Clinical Intervention Outcomes: Prevention of an adverse drug event   Victor Cortez Specialty Pharmacist

## 2024-03-02 NOTE — Addendum Note (Signed)
 Addended by: SHAREN DELON HERO on: 03/02/2024 10:03 AM   Modules accepted: Orders

## 2024-03-03 ENCOUNTER — Other Ambulatory Visit: Payer: Self-pay

## 2024-03-03 ENCOUNTER — Other Ambulatory Visit (HOSPITAL_COMMUNITY): Payer: Self-pay

## 2024-03-07 ENCOUNTER — Other Ambulatory Visit: Payer: Self-pay | Admitting: Cardiovascular Disease

## 2024-03-07 DIAGNOSIS — E782 Mixed hyperlipidemia: Secondary | ICD-10-CM

## 2024-03-07 DIAGNOSIS — I480 Paroxysmal atrial fibrillation: Secondary | ICD-10-CM

## 2024-03-07 DIAGNOSIS — I2510 Atherosclerotic heart disease of native coronary artery without angina pectoris: Secondary | ICD-10-CM

## 2024-03-07 DIAGNOSIS — I340 Nonrheumatic mitral (valve) insufficiency: Secondary | ICD-10-CM

## 2024-04-07 ENCOUNTER — Ambulatory Visit: Admitting: Cardiovascular Disease

## 2024-04-13 ENCOUNTER — Other Ambulatory Visit

## 2024-05-23 ENCOUNTER — Ambulatory Visit: Admitting: Pulmonary Disease

## 2024-11-16 ENCOUNTER — Ambulatory Visit: Admitting: Urology
